# Patient Record
Sex: Female | Born: 1960 | Hispanic: No | Smoking: Never smoker
Health system: Southern US, Community
[De-identification: ages and names within clinical notes are randomized; demographics above are authoritative.]

## PROBLEM LIST (undated history)

## (undated) DIAGNOSIS — G629 Polyneuropathy, unspecified: Secondary | ICD-10-CM

## (undated) DIAGNOSIS — E5111 Dry beriberi: Secondary | ICD-10-CM

## (undated) DIAGNOSIS — I10 Essential (primary) hypertension: Secondary | ICD-10-CM

## (undated) DIAGNOSIS — R160 Hepatomegaly, not elsewhere classified: Secondary | ICD-10-CM

## (undated) DIAGNOSIS — K760 Fatty (change of) liver, not elsewhere classified: Secondary | ICD-10-CM

## (undated) DIAGNOSIS — E559 Vitamin D deficiency, unspecified: Secondary | ICD-10-CM

## (undated) HISTORY — DX: Vitamin D deficiency, unspecified: E55.9

## (undated) HISTORY — DX: Fatty (change of) liver, not elsewhere classified: K76.0

## (undated) HISTORY — DX: Hepatomegaly, not elsewhere classified: R16.0

## (undated) HISTORY — DX: Dry beriberi: E51.11

## (undated) HISTORY — DX: Essential (primary) hypertension: I10

## (undated) HISTORY — DX: Polyneuropathy, unspecified: G62.9

---

## 1999-12-07 ENCOUNTER — Other Ambulatory Visit: Admission: RE | Admit: 1999-12-07 | Discharge: 1999-12-07 | Payer: Self-pay | Admitting: Obstetrics and Gynecology

## 2021-02-08 ENCOUNTER — Other Ambulatory Visit: Payer: Self-pay

## 2021-02-08 ENCOUNTER — Emergency Department (HOSPITAL_COMMUNITY): Payer: BC Managed Care – PPO

## 2021-02-08 ENCOUNTER — Inpatient Hospital Stay (HOSPITAL_COMMUNITY)
Admission: EM | Admit: 2021-02-08 | Discharge: 2021-02-27 | DRG: 640 | Disposition: A | Discharge status: Skilled Nursing Facility | Payer: BC Managed Care – PPO | Attending: Internal Medicine | Admitting: Internal Medicine

## 2021-02-08 ENCOUNTER — Inpatient Hospital Stay (HOSPITAL_COMMUNITY): Payer: BC Managed Care – PPO

## 2021-02-08 ENCOUNTER — Encounter (HOSPITAL_COMMUNITY): Payer: Self-pay

## 2021-02-08 DIAGNOSIS — E876 Hypokalemia: Principal | ICD-10-CM | POA: Diagnosis present

## 2021-02-08 DIAGNOSIS — R531 Weakness: Secondary | ICD-10-CM

## 2021-02-08 DIAGNOSIS — I10 Essential (primary) hypertension: Secondary | ICD-10-CM | POA: Diagnosis not present

## 2021-02-08 DIAGNOSIS — B9620 Unspecified Escherichia coli [E. coli] as the cause of diseases classified elsewhere: Secondary | ICD-10-CM | POA: Diagnosis present

## 2021-02-08 DIAGNOSIS — R260 Ataxic gait: Secondary | ICD-10-CM | POA: Diagnosis present

## 2021-02-08 DIAGNOSIS — Z20822 Contact with and (suspected) exposure to covid-19: Secondary | ICD-10-CM | POA: Diagnosis present

## 2021-02-08 DIAGNOSIS — D696 Thrombocytopenia, unspecified: Secondary | ICD-10-CM | POA: Diagnosis not present

## 2021-02-08 DIAGNOSIS — H409 Unspecified glaucoma: Secondary | ICD-10-CM | POA: Diagnosis present

## 2021-02-08 DIAGNOSIS — D520 Dietary folate deficiency anemia: Secondary | ICD-10-CM | POA: Diagnosis not present

## 2021-02-08 DIAGNOSIS — E871 Hypo-osmolality and hyponatremia: Secondary | ICD-10-CM | POA: Diagnosis not present

## 2021-02-08 DIAGNOSIS — R000 Tachycardia, unspecified: Secondary | ICD-10-CM | POA: Diagnosis present

## 2021-02-08 DIAGNOSIS — E43 Unspecified severe protein-calorie malnutrition: Secondary | ICD-10-CM | POA: Diagnosis present

## 2021-02-08 DIAGNOSIS — K2970 Gastritis, unspecified, without bleeding: Secondary | ICD-10-CM | POA: Diagnosis not present

## 2021-02-08 DIAGNOSIS — N390 Urinary tract infection, site not specified: Secondary | ICD-10-CM | POA: Diagnosis present

## 2021-02-08 DIAGNOSIS — D649 Anemia, unspecified: Secondary | ICD-10-CM | POA: Diagnosis not present

## 2021-02-08 DIAGNOSIS — K648 Other hemorrhoids: Secondary | ICD-10-CM | POA: Diagnosis present

## 2021-02-08 DIAGNOSIS — H04123 Dry eye syndrome of bilateral lacrimal glands: Secondary | ICD-10-CM | POA: Diagnosis present

## 2021-02-08 DIAGNOSIS — D518 Other vitamin B12 deficiency anemias: Secondary | ICD-10-CM | POA: Diagnosis not present

## 2021-02-08 DIAGNOSIS — Z681 Body mass index (BMI) 19 or less, adult: Secondary | ICD-10-CM | POA: Diagnosis not present

## 2021-02-08 DIAGNOSIS — R29898 Other symptoms and signs involving the musculoskeletal system: Secondary | ICD-10-CM

## 2021-02-08 DIAGNOSIS — R296 Repeated falls: Secondary | ICD-10-CM | POA: Diagnosis present

## 2021-02-08 DIAGNOSIS — G8250 Quadriplegia, unspecified: Secondary | ICD-10-CM

## 2021-02-08 LAB — CBC WITH DIFFERENTIAL/PLATELET
Abs Immature Granulocytes: 0.08 10*3/uL — ABNORMAL HIGH (ref 0.00–0.07)
Basophils Absolute: 0.1 10*3/uL (ref 0.0–0.1)
Basophils Relative: 0 %
Eosinophils Absolute: 0 10*3/uL (ref 0.0–0.5)
Eosinophils Relative: 0 %
HCT: 28.5 % — ABNORMAL LOW (ref 36.0–46.0)
Hemoglobin: 10.1 g/dL — ABNORMAL LOW (ref 12.0–15.0)
Immature Granulocytes: 1 %
Lymphocytes Relative: 8 %
Lymphs Abs: 1 10*3/uL (ref 0.7–4.0)
MCH: 36.2 pg — ABNORMAL HIGH (ref 26.0–34.0)
MCHC: 35.4 g/dL (ref 30.0–36.0)
MCV: 102.2 fL — ABNORMAL HIGH (ref 80.0–100.0)
Monocytes Absolute: 1.1 10*3/uL — ABNORMAL HIGH (ref 0.1–1.0)
Monocytes Relative: 9 %
Neutro Abs: 9.7 10*3/uL — ABNORMAL HIGH (ref 1.7–7.7)
Neutrophils Relative %: 82 %
Platelets: 53 10*3/uL — ABNORMAL LOW (ref 150–400)
RBC: 2.79 MIL/uL — ABNORMAL LOW (ref 3.87–5.11)
RDW: 15.5 % (ref 11.5–15.5)
WBC: 11.9 10*3/uL — ABNORMAL HIGH (ref 4.0–10.5)
nRBC: 0.2 % (ref 0.0–0.2)

## 2021-02-08 LAB — BILIRUBIN, FRACTIONATED(TOT/DIR/INDIR)
Bilirubin, Direct: 0.4 mg/dL — ABNORMAL HIGH (ref 0.0–0.2)
Indirect Bilirubin: 0.7 mg/dL (ref 0.3–0.9)
Total Bilirubin: 1.1 mg/dL (ref 0.3–1.2)

## 2021-02-08 LAB — URINALYSIS, ROUTINE W REFLEX MICROSCOPIC
Glucose, UA: 50 mg/dL — AB
Ketones, ur: 20 mg/dL — AB
Nitrite: NEGATIVE
Protein, ur: 30 mg/dL — AB
Specific Gravity, Urine: 1.017 (ref 1.005–1.030)
pH: 5 (ref 5.0–8.0)

## 2021-02-08 LAB — COMPREHENSIVE METABOLIC PANEL
ALT: 34 U/L (ref 0–44)
AST: 62 U/L — ABNORMAL HIGH (ref 15–41)
Albumin: 3.7 g/dL (ref 3.5–5.0)
Alkaline Phosphatase: 79 U/L (ref 38–126)
Anion gap: 14 (ref 5–15)
BUN: 7 mg/dL (ref 6–20)
CO2: 24 mmol/L (ref 22–32)
Calcium: 8.2 mg/dL — ABNORMAL LOW (ref 8.9–10.3)
Chloride: 96 mmol/L — ABNORMAL LOW (ref 98–111)
Creatinine, Ser: 0.67 mg/dL (ref 0.44–1.00)
GFR, Estimated: >60 mL/min (ref >60)
Glucose, Bld: 136 mg/dL — ABNORMAL HIGH (ref 70–99)
Potassium: 2.4 mmol/L — CL (ref 3.5–5.1)
Sodium: 134 mmol/L — ABNORMAL LOW (ref 135–145)
Total Bilirubin: 1.4 mg/dL — ABNORMAL HIGH (ref 0.3–1.2)
Total Protein: 7.2 g/dL (ref 6.5–8.1)

## 2021-02-08 LAB — VITAMIN B12: Vitamin B-12: 374 pg/mL (ref 180–914)

## 2021-02-08 LAB — FOLATE: Folate: 1.8 ng/mL — ABNORMAL LOW (ref >5.9)

## 2021-02-08 LAB — IRON AND TIBC
Iron: 139 ug/dL (ref 28–170)
Saturation Ratios: 89 % — ABNORMAL HIGH (ref 10.4–31.8)
TIBC: 156 ug/dL — ABNORMAL LOW (ref 250–450)
UIBC: 17 ug/dL

## 2021-02-08 LAB — TSH: TSH: 1.081 u[IU]/mL (ref 0.350–4.500)

## 2021-02-08 LAB — RESP PANEL BY RT-PCR (FLU A&B, COVID) ARPGX2
Influenza A by PCR: NEGATIVE
Influenza B by PCR: NEGATIVE
SARS Coronavirus 2 by RT PCR: NEGATIVE

## 2021-02-08 LAB — MAGNESIUM
Magnesium: 1.7 mg/dL (ref 1.7–2.4)
Magnesium: 2.1 mg/dL (ref 1.7–2.4)

## 2021-02-08 LAB — LIPASE, BLOOD: Lipase: 51 U/L (ref 11–51)

## 2021-02-08 IMAGING — MR MR HEAD W/O CM
11 of 15 series · 38 of 48 positions shown · non-contrast
Comparison: Same day CT head.

CLINICAL DATA: Transient ischemic attack.

EXAM:
MRI HEAD WITHOUT CONTRAST
TECHNIQUE: Multiplanar, multiecho pulse sequences of the brain and surrounding
structures were obtained without intravenous contrast.

[Series 5: DWI · axial · 3.0mm · 1.36mm/px · z∈[-47,+93]mm · 5 of 96 slices shown (1 of 2)]
[im 1/96]
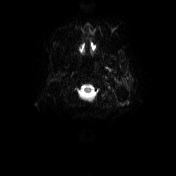
[im 24/96]
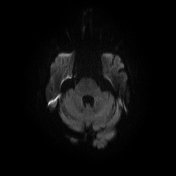
[im 48/96]
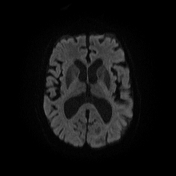
[im 72/96]
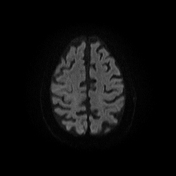
[im 96/96]
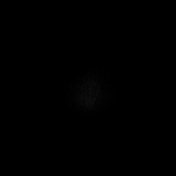

[Series 6: DWI · axial · 3.0mm · 1.36mm/px · z∈[-47,+93]mm · 3 of 48 slices shown (2 of 2)]
[im 1/48]
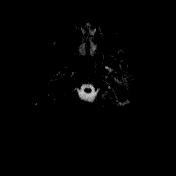
[im 24/48]
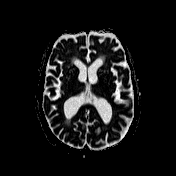
[im 48/48]
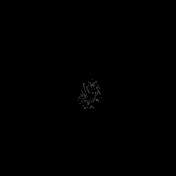

[Series 7: T1 · sagittal · 5.0mm · 0.75mm/px · 2 of 24 slices shown (1 of 2)]
[im 1/24]
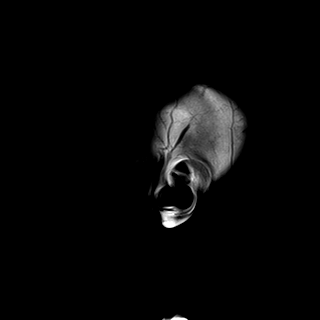
[im 24/24]
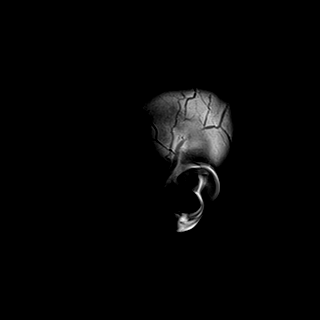

[Series 8: T2 · axial · 5.0mm · 0.62mm/px · z∈[-60,+101]mm · 2 of 26 slices shown (1 of 2)]
[im 1/26]
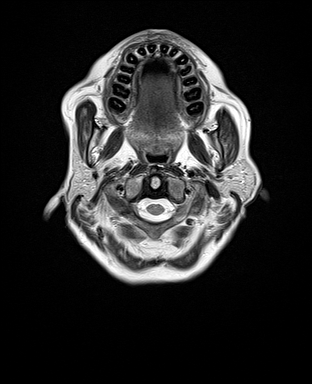
[im 26/26]
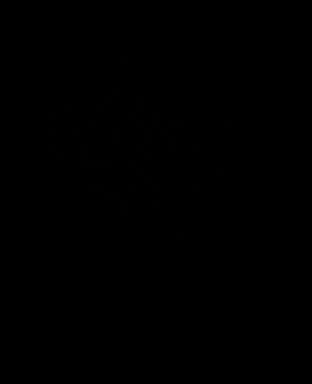

[Series 9: swi_images · axial · 3.0mm · 0.75mm/px · z∈[-61,+102]mm · 4 of 56 slices shown]
[im 1/56]
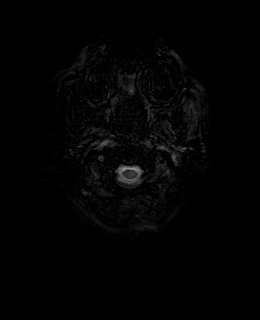
[im 19/56]
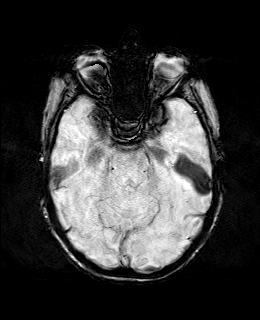
[im 37/56]
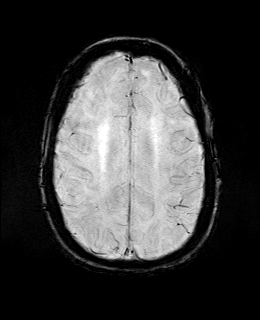
[im 56/56]
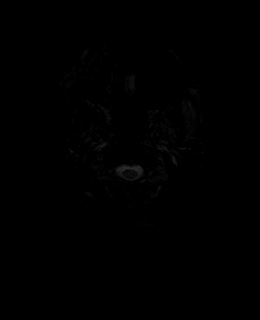

[Series 10: mip_images(sw) · axial · 24.0mm · 0.75mm/px · z∈[-51,+92]mm · 3 of 49 slices shown]
[im 1/49]
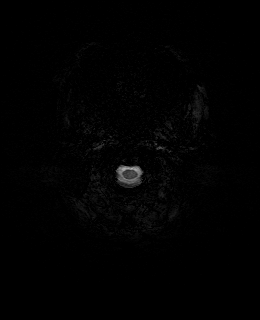
[im 25/49]
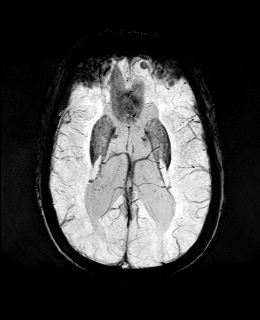
[im 49/49]
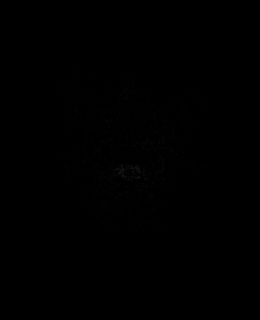

[Series 11: FLAIR · axial · 3.0mm · 0.75mm/px · z∈[-55,+96]mm · 3 of 52 slices shown]
[im 1/52]
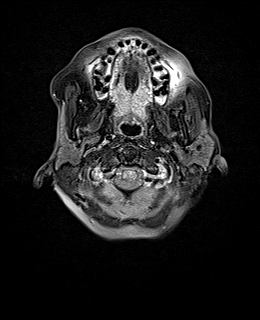
[im 26/52]
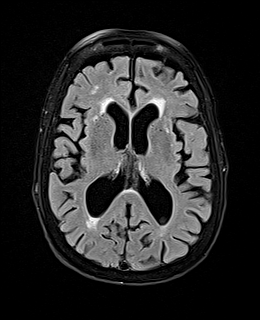
[im 52/52]
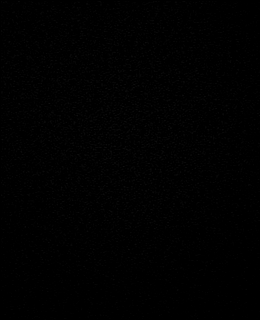

[Series 12: T1 · axial · 1.0mm · 0.94mm/px · z∈[-50,+91]mm · 9 of 144 slices shown (2 of 2)]
[im 1/144]
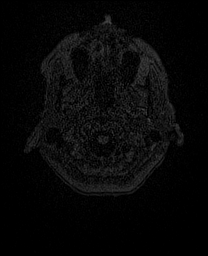
[im 18/144]
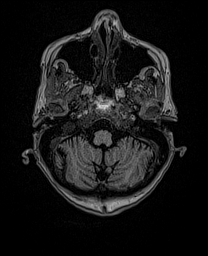
[im 36/144]
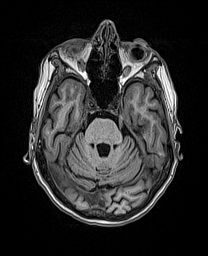
[im 54/144]
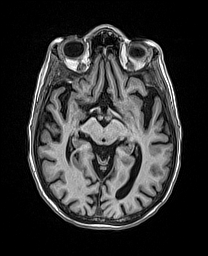
[im 72/144]
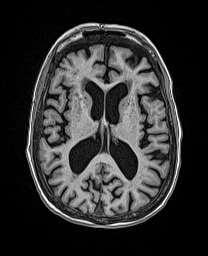
[im 90/144]
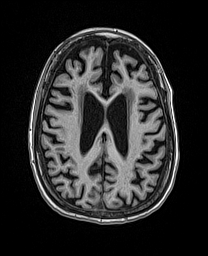
[im 108/144]
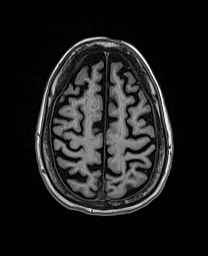
[im 126/144]
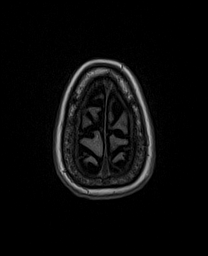
[im 144/144]
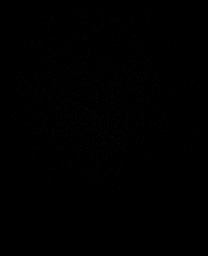

[Series 13: cor dwi_tracew · coronal · 5.0mm · 1.53mm/px · 3 of 54 slices shown]
[im 1/54]
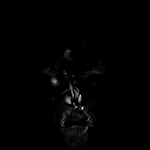
[im 27/54]
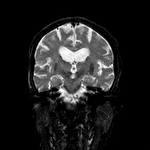
[im 54/54]
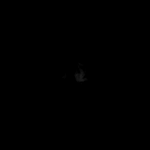

[Series 14: cor dwi_adc · coronal · 5.0mm · 1.53mm/px · 2 of 27 slices shown]
[im 1/27]
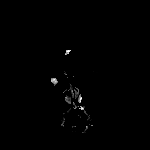
[im 27/27]
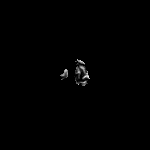

[Series 15: T2 · coronal · 5.0mm · 0.57mm/px · 2 of 34 slices shown (2 of 2)]
[im 1/34]
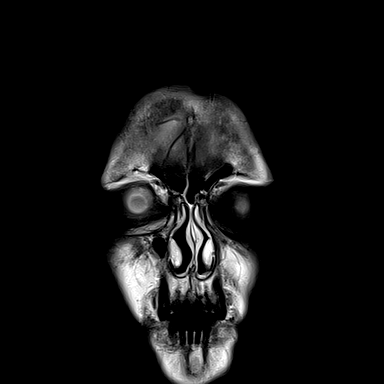
[im 34/34]
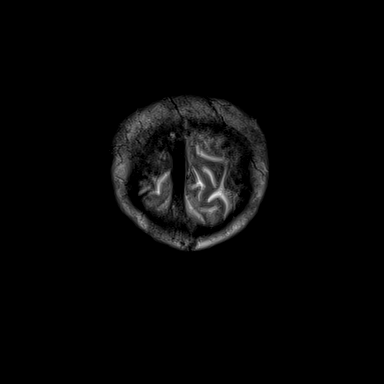

[38 of 48 positions shown; findings below may reference images not displayed]

FINDINGS: Brain: No acute infarction, hemorrhage, hydrocephalus, extra-axial
collection or mass lesion. Moderate atrophy with ventriculomegaly.
Cerebellar tonsils are in normal position. Unremarkable sella.

Vascular: Major arterial flow voids are maintained at the skull
base.

Skull and upper cervical spine: Normal marrow signal.

Sinuses/Orbits: Mild ethmoid air cell mucosal thickening. No
air-fluid levels. Unremarkable orbits.

Other: No sizable mastoid effusions.
IMPRESSION: 1. No acute infarct.
2. Ventriculomegaly, which may be ex vacuo in the setting of
moderate atrophy. Normal pressure hydrocephalus could have a similar
appearance in the correct clinical setting.
3. Mild to moderate T2/FLAIR hyperintensities within the white
matter. This finding is nonspecific but can be seen in the setting
of chronic microvascular ischemia, a demyelinating process such as
multiple sclerosis, chronic migraines, or as the sequelae of a prior
infectious or inflammatory process.

## 2021-02-08 IMAGING — CT CT HEAD W/O CM
3 series · 15 of 47 positions shown, 18 images · non-contrast
Comparison: None.

CLINICAL DATA: Acute neuro deficit.  Vision change.  Unsteady gait

EXAM:
CT HEAD WITHOUT CONTRAST
TECHNIQUE: Contiguous axial images were obtained from the base of the skull
through the vertex without intravenous contrast.

[Series 2: head wo · axial · 0.47mm/px · z∈[-120,+5]mm · 9 of 30 slices shown, 12 images]
[im 3/30  brain]
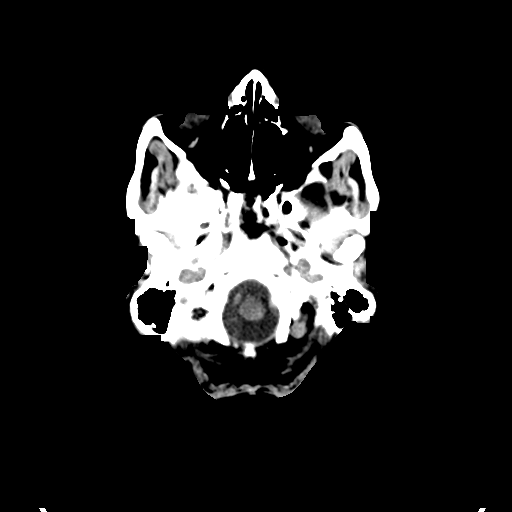
[im 3/30  bone]
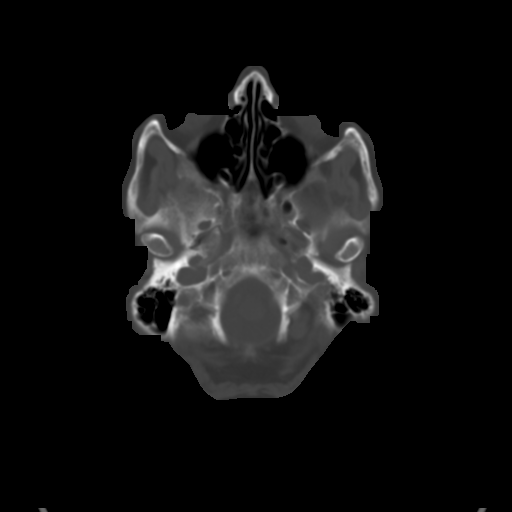
[im 6/30  brain]
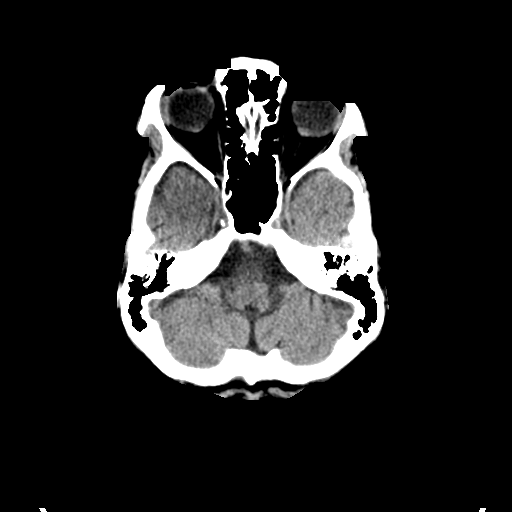
[im 9/30  brain]
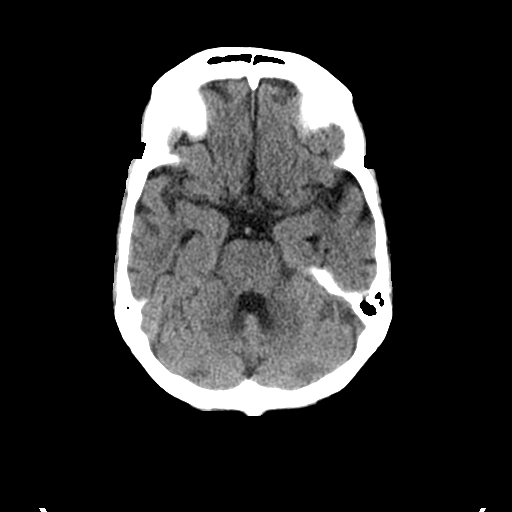
[im 12/30  brain]
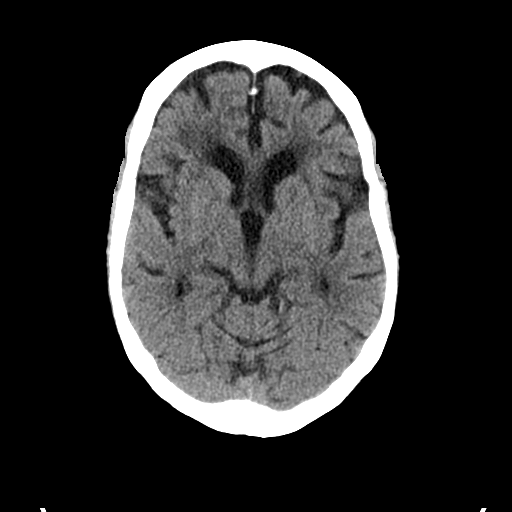
[im 16/30  brain]
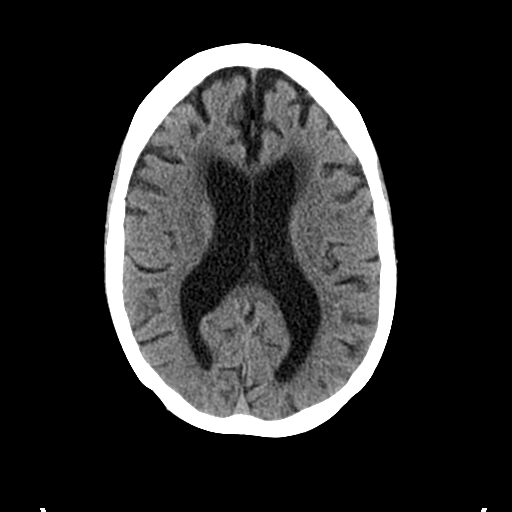
[im 16/30  bone]
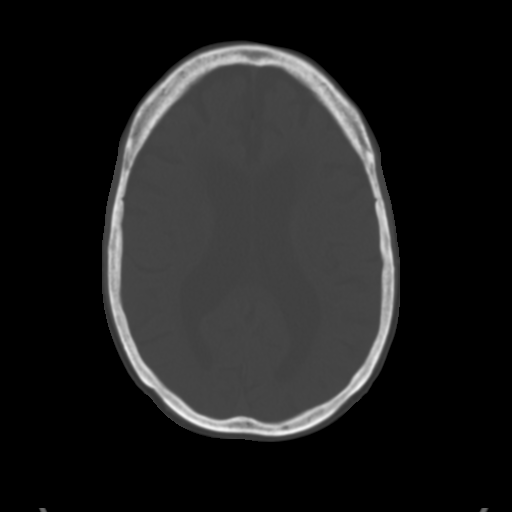
[im 19/30  brain]
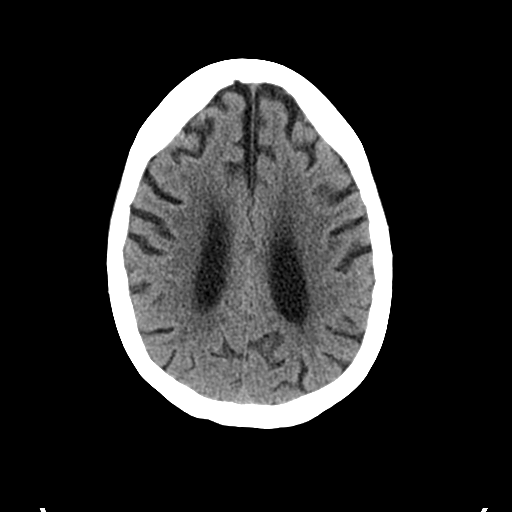
[im 22/30  brain]
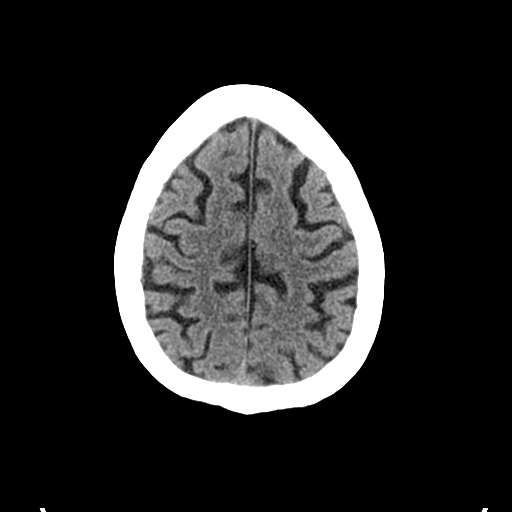
[im 25/30  brain]
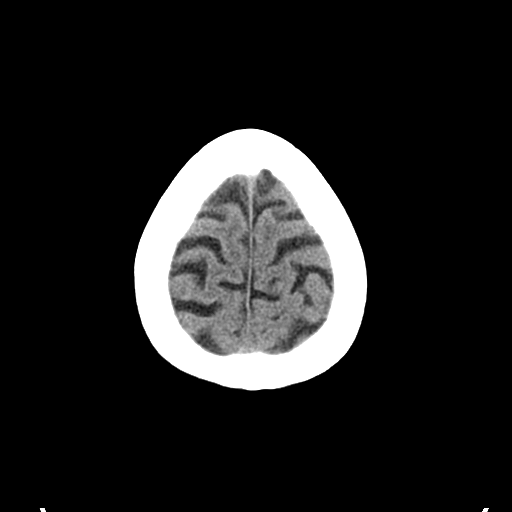
[im 28/30  brain]
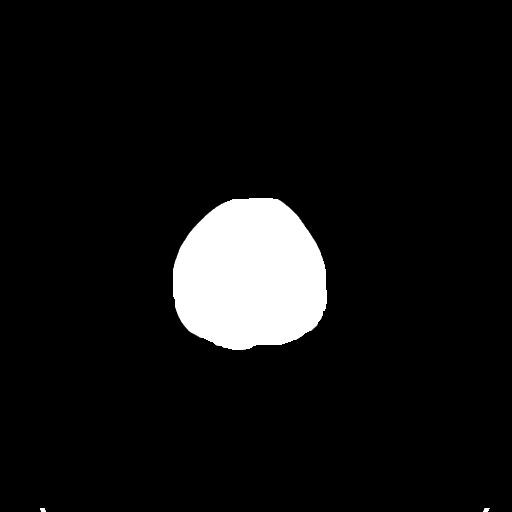
[im 28/30  bone]
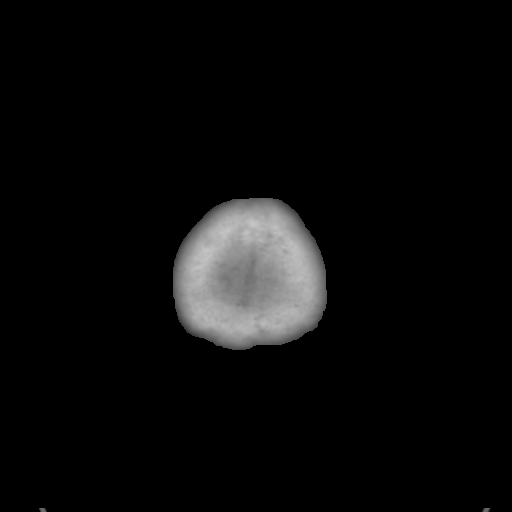

[Series 5: coronal soft tissue · coronal · 0.30mm/px · 3 of 72 slices shown]
[im 24/72  brain]
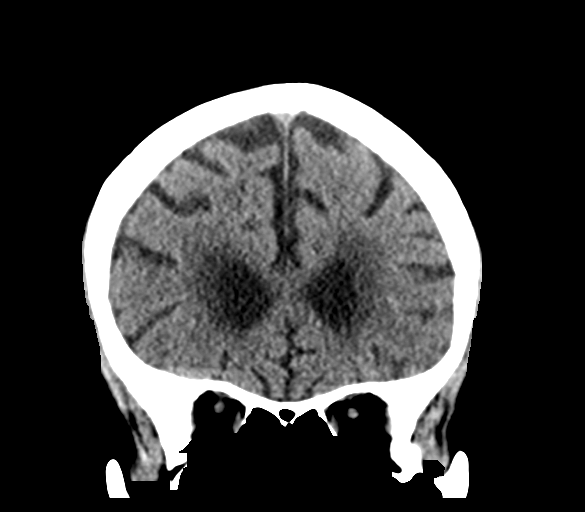
[im 32/72  brain]
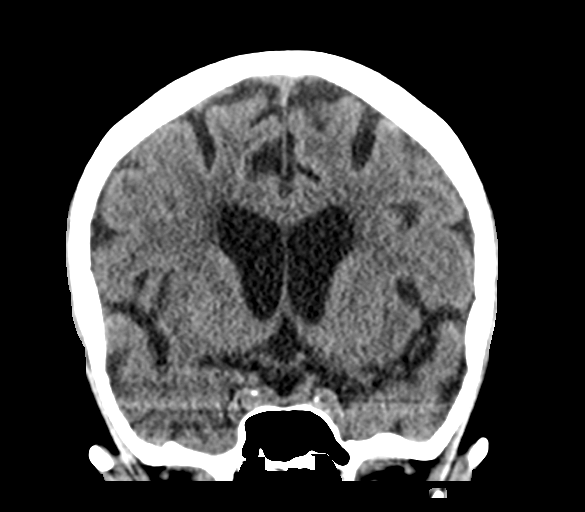
[im 40/72  brain]
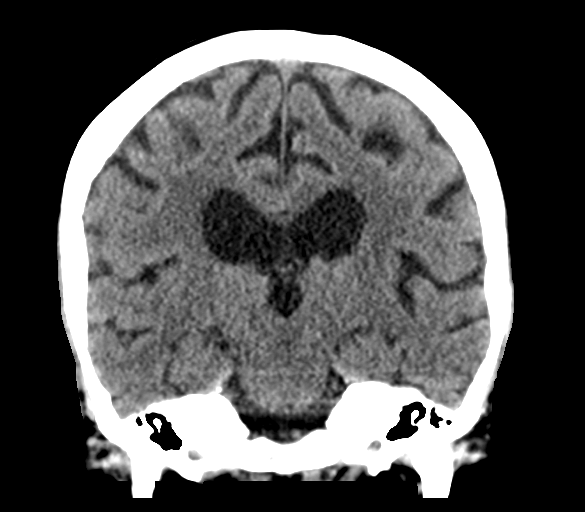

[Series 6: sagittal soft tissue · sagittal · 0.29mm/px · 3 of 55 slices shown]
[im 19/55  brain]
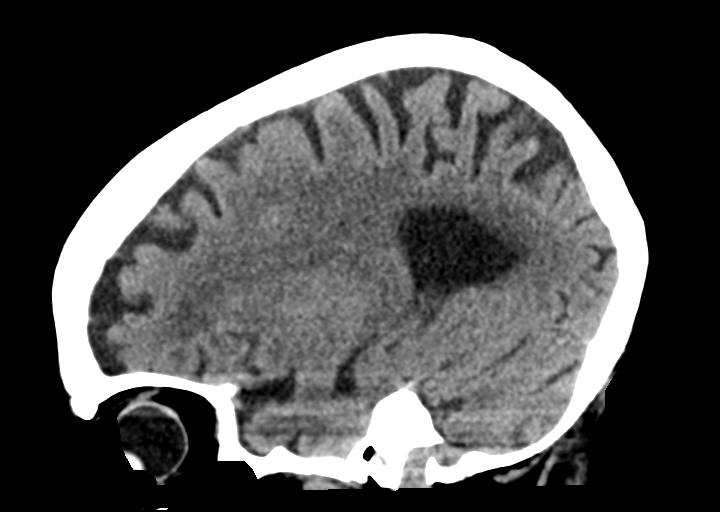
[im 28/55  brain]
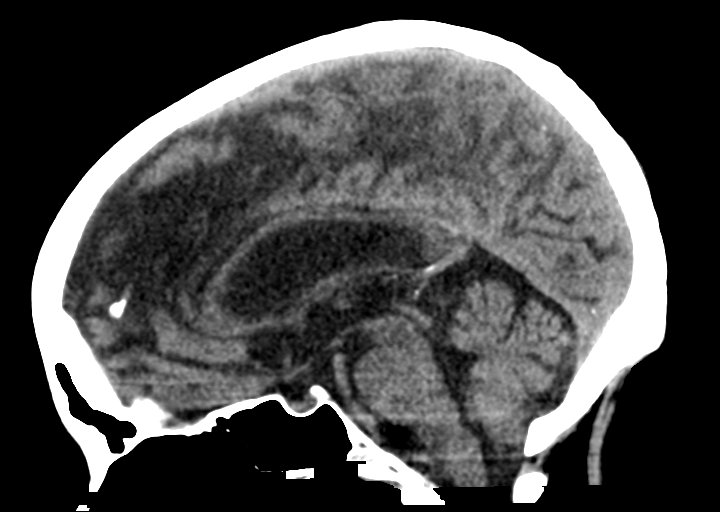
[im 37/55  brain]
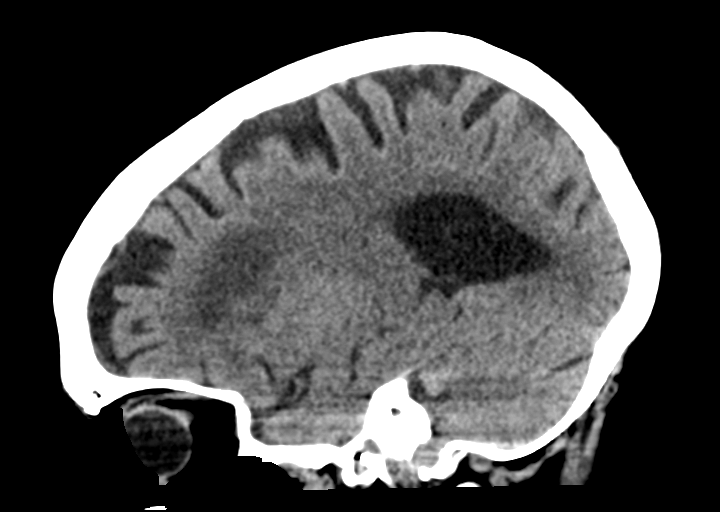

[15 of 47 positions shown; findings below may reference images not displayed]

FINDINGS: Brain: Generalized atrophy. Periventricular white matter hypodensity
bilaterally appears symmetric.

Negative for acute infarct, hemorrhage, mass.

Vascular: Negative for hyperdense vessel

Skull: Negative

Sinuses/Orbits: Negative

Other: None
IMPRESSION: No acute abnormality

Atrophy and bilateral white matter ischemia which appears chronic.

## 2021-02-08 MED ORDER — POTASSIUM CHLORIDE 10 MEQ/100ML IV SOLN
10.0000 meq | Freq: Once | INTRAVENOUS | Status: AC
  Administered 2021-02-08: 10 meq via INTRAVENOUS
  Filled 2021-02-08: qty 100

## 2021-02-08 MED ORDER — SODIUM CHLORIDE 0.9 % IV SOLN
1.0000 g | Freq: Once | INTRAVENOUS | Status: AC
  Administered 2021-02-08: 1 g via INTRAVENOUS
  Filled 2021-02-08: qty 10

## 2021-02-08 MED ORDER — LORAZEPAM 0.5 MG PO TABS
0.5000 mg | ORAL_TABLET | Freq: Four times a day (QID) | ORAL | Status: DC | PRN
  Administered 2021-02-11 – 2021-02-26 (×15): 0.5 mg via ORAL
  Filled 2021-02-08 (×17): qty 1

## 2021-02-08 MED ORDER — POTASSIUM CHLORIDE CRYS ER 20 MEQ PO TBCR
40.0000 meq | EXTENDED_RELEASE_TABLET | Freq: Every day | ORAL | Status: DC
  Administered 2021-02-09: 40 meq via ORAL
  Filled 2021-02-08: qty 2

## 2021-02-08 MED ORDER — CALCIUM GLUCONATE-NACL 1-0.675 GM/50ML-% IV SOLN
1.0000 g | Freq: Once | INTRAVENOUS | Status: AC
  Administered 2021-02-08: 1000 mg via INTRAVENOUS
  Filled 2021-02-08: qty 50

## 2021-02-08 MED ORDER — SODIUM CHLORIDE 0.9 % IV BOLUS
1000.0000 mL | Freq: Once | INTRAVENOUS | Status: AC
  Administered 2021-02-08: 1000 mL via INTRAVENOUS

## 2021-02-08 MED ORDER — MAGNESIUM SULFATE 2 GM/50ML IV SOLN
2.0000 g | Freq: Once | INTRAVENOUS | Status: AC
  Administered 2021-02-08: 2 g via INTRAVENOUS
  Filled 2021-02-08: qty 50

## 2021-02-08 MED ORDER — POTASSIUM CHLORIDE 10 MEQ/100ML IV SOLN
10.0000 meq | INTRAVENOUS | Status: AC
  Administered 2021-02-08 (×5): 10 meq via INTRAVENOUS
  Filled 2021-02-08 (×5): qty 100

## 2021-02-08 MED ORDER — POTASSIUM CHLORIDE CRYS ER 20 MEQ PO TBCR: 40.0000 meq | EXTENDED_RELEASE_TABLET | Freq: Every day | ORAL | Status: DC

## 2021-02-08 MED ORDER — ACETAMINOPHEN 325 MG PO TABS
650.0000 mg | ORAL_TABLET | Freq: Four times a day (QID) | ORAL | Status: DC | PRN
  Administered 2021-02-10 – 2021-02-26 (×8): 650 mg via ORAL
  Filled 2021-02-08 (×8): qty 2

## 2021-02-08 MED ORDER — POTASSIUM CHLORIDE 10 MEQ/100ML IV SOLN: 10.0000 meq | INTRAVENOUS | Status: DC

## 2021-02-08 MED ORDER — ONDANSETRON HCL 4 MG/2ML IJ SOLN
4.0000 mg | Freq: Once | INTRAMUSCULAR | Status: AC
  Administered 2021-02-08: 4 mg via INTRAVENOUS
  Filled 2021-02-08: qty 2

## 2021-02-08 MED ORDER — ACETAMINOPHEN 650 MG RE SUPP: 650.0000 mg | Freq: Four times a day (QID) | RECTAL | Status: DC | PRN

## 2021-02-08 MED ORDER — POTASSIUM CHLORIDE CRYS ER 20 MEQ PO TBCR
40.0000 meq | EXTENDED_RELEASE_TABLET | Freq: Once | ORAL | Status: AC
  Administered 2021-02-08: 40 meq via ORAL
  Filled 2021-02-08: qty 2

## 2021-02-08 NOTE — ED Notes (Signed)
Patient transported to MRI 

## 2021-02-08 NOTE — H&P (Signed)
History and Physical    [name] [name] YPP:[id number] DOB: 17-Nov-1960 DOA: 02/08/2021  PCP: Pcp, No  Patient coming from: Home  Chief Complaint: N/V  HPI: [name] [name] is a 60 y.o. female with no past medical history. Presenting with N/V, vision changes, and weakness. She reports that she's been dealing with N/V intermittently for the last 2 months. She hasn't seen a doctor about her symptoms. She hasn't tried any medicines. Her symptoms were not associated with any events that she can recall. Over the last several days, her symptoms have worsened to include vomiting up everything. She tried gatorade, but that didn't help. She notes that over the last several days she's had increased intermittent visual blurriness. She has no associated HA or eye pain. She has also noted worsening tingling and numbness in her hands and feet. This started 2 weeks ago and has worsened over the last several days. She believes it has resulted in an overall general weakness that has resulted in her having multiple falls this morning. She denies any LOC or head injury during these falls. She became concerned and called for EMS to bring her to the ED. She denies any other aggravating or alleviating factors.   ED Course: She was found to have a K+ of 2.4. She was found to have a UTI. CTH was negative. She was given K+ and rocephin. TRH was called for admission.   Review of Systems:  Denies CP, palpitations, dyspnea, lightheadedness, diarrhea, fever. Review of systems is otherwise negative for all not mentioned in HPI.   PMHx History reviewed. No pertinent past medical history.  PSHx History reviewed. No pertinent surgical history.  SocHx  reports that she has never smoked. She has never used smokeless tobacco. She reports current alcohol use. She reports that she does not use drugs.  No Known Allergies  FamHx History reviewed. No pertinent family history.  Prior to Admission medications   Not on File     Physical Exam: Vitals:   02/08/21 1205 02/08/21 1210 02/08/21 1352 02/08/21 1447  BP: (!) 141/95  (!) 125/98 (!) 121/96  Pulse: (!) 106  (!) 105 (!) 105  Resp: 16  10 14   Temp:  98.5 F (36.9 C) 98 F (36.7 C)   TempSrc:   Oral   SpO2: 100%  98% 98%  Weight:  49.9 kg    Height:  5\' 1"  (1.549 m)      General: 60 y.o. female resting in bed in NAD Eyes: PERRL, normal sclera ENMT: Nares patent w/o discharge, orophaynx clear, dentition normal, ears w/o discharge/lesions/ulcers Neck: Supple, trachea midline Cardiovascular: tachy, +S1, S2, no m/g/r, equal pulses throughout Respiratory: CTABL, no w/r/r, normal WOB GI: BS+, NDNT, no masses noted, no organomegaly noted MSK: No e/c/c Skin: No rashes, bruises, ulcerations noted Neuro: A&O x 3, report numbness and tingling in feet, 4-5/5 msk str BLE Psyc: Appropriate interaction and affect, calm/cooperative  Labs on Admission: I have personally reviewed following labs and imaging studies  CBC: Recent Labs  Lab 02/08/21 1253  WBC 11.9*  NEUTROABS 9.7*  HGB 10.1*  HCT 28.5*  MCV 102.2*  PLT 53*   Basic Metabolic Panel: Recent Labs  Lab 02/08/21 1253  NA 134*  K 2.4*  CL 96*  CO2 24  GLUCOSE 136*  BUN 7  CREATININE 0.67  CALCIUM 8.2*   GFR: Estimated Creatinine Clearance: 57.1 mL/min (by C-G formula based on SCr of 0.67 mg/dL). Liver Function Tests: Recent Labs  Lab 02/08/21  1253  AST 62*  ALT 34  ALKPHOS 79  BILITOT 1.4*  PROT 7.2  ALBUMIN 3.7   Recent Labs  Lab 02/08/21 1253  LIPASE 51   No results for input(s): AMMONIA in the last 168 hours. Coagulation Profile: No results for input(s): INR, PROTIME in the last 168 hours. Cardiac Enzymes: No results for input(s): CKTOTAL, CKMB, CKMBINDEX, TROPONINI in the last 168 hours. BNP (last 3 results) No results for input(s): PROBNP in the last 8760 hours. HbA1C: No results for input(s): HGBA1C in the last 72 hours. CBG: No results for input(s): GLUCAP  in the last 168 hours. Lipid Profile: No results for input(s): CHOL, HDL, LDLCALC, TRIG, CHOLHDL, LDLDIRECT in the last 72 hours. Thyroid Function Tests: No results for input(s): TSH, T4TOTAL, FREET4, T3FREE, THYROIDAB in the last 72 hours. Anemia Panel: No results for input(s): VITAMINB12, FOLATE, FERRITIN, TIBC, IRON, RETICCTPCT in the last 72 hours. Urine analysis:    Component Value Date/Time   COLORURINE AMBER (A) 02/08/2021 1241   APPEARANCEUR CLOUDY (A) 02/08/2021 1241   LABSPEC 1.017 02/08/2021 1241   PHURINE 5.0 02/08/2021 1241   GLUCOSEU 50 (A) 02/08/2021 1241   HGBUR LARGE (A) 02/08/2021 1241   BILIRUBINUR SMALL (A) 02/08/2021 1241   KETONESUR 20 (A) 02/08/2021 1241   PROTEINUR 30 (A) 02/08/2021 1241   NITRITE NEGATIVE 02/08/2021 1241   LEUKOCYTESUR LARGE (A) 02/08/2021 1241    Radiological Exams on Admission: CT HEAD WO CONTRAST  Result Date: 02/08/2021 CLINICAL DATA:  Acute neuro deficit.  Vision change.  Unsteady gait EXAM: CT HEAD WITHOUT CONTRAST TECHNIQUE: Contiguous axial images were obtained from the base of the skull through the vertex without intravenous contrast. COMPARISON:  None. FINDINGS: Brain: Generalized atrophy. Periventricular white matter hypodensity bilaterally appears symmetric. Negative for acute infarct, hemorrhage, mass. 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 Electronically Signed   By: Marlan Palau M.D.   On: 02/08/2021 13:34    EKG: Independently reviewed. Sinus tach, no st elevation  Assessment/Plan BLE Numbness/Tingling Visual changes Generalized weakness     - admit to inpt, progressive     - BLE weakness noted but she says she feels weak all over     - her lytes definitely can be causing her some problems here; in addition to the UTI     - can't dismiss the possibility of TIA/CVA as  she talked about intermittent visual loss over the last several days     - check MRI brain     - replace lytes     - treat UTI     - check B12, folate, thiamine  Hypokalemia Hyponatremia Hypocalcemia     - K+ is 2.4; Getting 60 mEq IV, plus PO BID; follow BMP at 2000hrs tonight     - also getting 2g IV Mg2+; check Mg2+ levels     - Ca2+ corrects from 8.2 to 8.4; can give 1g Ca2+     - continue NS  N/V     - have to watch Qt, especially with her lyte derangement, will add ativan for N/V for now  Thrombocytopenia Macrocytic anemia     - check b12, THF, iron studies     - no evidence of bleeds     - SCDs for DVTs PPX  UTI     - UA dirty; started rocephin in ED  Elevated t bili     -  likely d/t dehydration, but will check a fractionated bili     - continue fluids  Mildly elevated LFTs     - follow  DVT prophylaxis: SCDs  Code Status: DNI  Family Communication: None at bedside  Consults called: None   Status is: Inpatient  Remains inpatient appropriate because:Inpatient level of care appropriate due to severity of illness  Dispo: The patient is from: Home              Anticipated d/c is to: Home              Patient currently is not medically stable to d/c.   Difficult to place patient No  Time spent coordinating admission: 70 minutes  Letecia Arps A Andree Heeg DO [facility]  If 7PM-7AM, please contact night-coverage [email or web address]  02/08/2021, 3:18 PM

## 2021-02-08 NOTE — ED Provider Notes (Signed)
Emergency Medicine Provider Triage Evaluation Note  [name] [name] [name] , [name] [name] 60 y.o. female  was evaluated in triage.  Pt complains of weakness that started about 2 weeks ago. States that hands and feet have been feeling weak/tingly and she also lost vision 3 days ago. She further reports feeling off balance.   Review of Systems  Positive: Weakness, tingling, vision loss, nv Negative: headache  Physical Exam  BP (!) 141/95   Pulse (!) 106   Temp 98.5 F (36.9 C)   Resp 16   Ht 5\' 1"  (1.549 m)   Wt 49.9 kg   LMP  (LMP Unknown) Comment: Last menstrual period was 8-9 years ago  SpO2 100%   BMI 20.78 kg/m  Gen:   Awake, no distress   Resp:  Normal effort  MSK:   Moves extremities without difficulty  Other:  Perrl, bilat horitzonal nystagmus,strength symmetric to bue, ble weaker but symmetric, decreased sensation noted to bue/ble (mostly distally)  Medical Decision Making  Medically screening exam initiated at 12:35 PM.  Appropriate orders placed.  was informed that the remainder of the evaluation will be completed by another provider, this initial triage assessment does not replace that evaluation, and the importance of remaining in the ED until their evaluation is complete.     [name] [name] [name] 02/08/21 1241    02/10/21, MD 02/09/21 [phone]

## 2021-02-08 NOTE — ED Notes (Signed)
ED TO INPATIENT HANDOFF REPORT  Name/Age/Gender [name] [name] 60 y.o. female  Code Status    Code Status Orders  (From admission, onward)         Start     Ordered   02/08/21 2046  Limited resuscitation (code)  Continuous       Question Answer Comment  In the event of cardiac or respiratory ARREST: Initiate Code Blue, Call Rapid Response Yes   In the event of cardiac or respiratory ARREST: Perform CPR Yes   In the event of cardiac or respiratory ARREST: Perform Intubation/Mechanical Ventilation No   In the event of cardiac or respiratory ARREST: Use NIPPV/BiPAp only if indicated Yes   In the event of cardiac or respiratory ARREST: Administer ACLS medications if indicated Yes   In the event of cardiac or respiratory ARREST: Perform Defibrillation or Cardioversion if indicated Yes   Comments DNI confirmed with patient      02/08/21 2045        Code Status History    This patient has a current code status but no historical code status.    Advance Directive Documentation   Flowsheet Row Most Recent Value  Type of Advance Directive Healthcare Power of Attorney, Living will  Pre-existing out of facility DNR order (yellow form or pink MOST form) --  "MOST" Form in Place? --      Home/SNF/Other Home  Chief Complaint Hypokalemia [E87.6]  Level of Care/Admitting Diagnosis ED Disposition    ED Disposition  Admit   Condition  --   Comment  Hospital Area: [facility] [[id number]]  Level of Care: Progressive [102]  Admit to Progressive based on following criteria: MULTISYSTEM THREATS such as stable sepsis, metabolic/electrolyte imbalance with or without encephalopathy that is responding to early treatment.  May admit patient to [facility] or [facility] if equivalent level of care is available:: No  Covid Evaluation: Confirmed COVID Negative  Diagnosis: Hypokalemia [172180]  Admitting Physician: Teddy Spike [[id number]]  Attending Physician: Teddy Spike [[id number]]  Estimated length of stay: past midnight tomorrow  Certification:: I certify this patient will need inpatient services for at least 2 midnights         Medical History History reviewed. No pertinent past medical history.  Allergies No Known Allergies  IV Location/Drains/Wounds Patient Lines/Drains/Airways Status    Active Line/Drains/Airways    Name Placement date Placement time Site Days   Peripheral IV 02/08/21 20 G Right Forearm 02/08/21  1252  Forearm  less than 1          Labs/Imaging Results for orders placed or performed during the hospital encounter of 02/08/21 (from the past 48 hour(s))  Urinalysis, Routine w reflex microscopic Urine, Clean Catch     Status: Abnormal   Collection Time: 02/08/21 12:41 PM  Result Value Ref Range   Color, Urine AMBER (A) YELLOW    Comment: BIOCHEMICALS MAY BE AFFECTED BY COLOR   APPearance CLOUDY (A) CLEAR   Specific Gravity, Urine 1.017 1.005 - 1.030   pH 5.0 5.0 - 8.0   Glucose, UA 50 (A) NEGATIVE mg/dL   Hgb urine dipstick LARGE (A) NEGATIVE   Bilirubin Urine SMALL (A) NEGATIVE   Ketones, ur 20 (A) NEGATIVE mg/dL   Protein, ur 30 (A) NEGATIVE mg/dL   Nitrite NEGATIVE NEGATIVE   Leukocytes,Ua LARGE (A) NEGATIVE   RBC / HPF 0-5 0 - 5 RBC/hpf   WBC, UA 11-20 0 - 5 WBC/hpf   Bacteria,  UA RARE (A) NONE SEEN   Squamous Epithelial / LPF 6-10 0 - 5   Mucus PRESENT    Hyaline Casts, UA PRESENT     Comment: Performed at [facility], [address], [place], [place] [address]  Comprehensive metabolic panel     Status: Abnormal   Collection Time: 02/08/21 12:53 PM  Result Value Ref Range   Sodium 134 (L) 135 - 145 mmol/L   Potassium 2.4 (LL) 3.5 - 5.1 mmol/L    Comment: CRITICAL RESULT CALLED TO, READ BACK BY AND VERIFIED WITH: M.BOWEN, RN AT 1336 ON 06.13.22 BY N.THOMPSON    Chloride 96 (L) 98 - 111 mmol/L   CO2 24 22 - 32 mmol/L   Glucose, Bld 136 (H) 70 - 99 mg/dL    Comment: Glucose  reference range applies only to samples taken after fasting for at least 8 hours.   BUN 7 6 - 20 mg/dL   Creatinine, Ser 8.50 0.44 - 1.00 mg/dL   Calcium 8.2 (L) 8.9 - 10.3 mg/dL   Total Protein 7.2 6.5 - 8.1 g/dL   Albumin 3.7 3.5 - 5.0 g/dL   AST 62 (H) 15 - 41 U/L   ALT 34 0 - 44 U/L   Alkaline Phosphatase 79 38 - 126 U/L   Total Bilirubin 1.4 (H) 0.3 - 1.2 mg/dL   GFR, Estimated >27 >74 mL/min    Comment: (NOTE) Calculated using the CKD-EPI Creatinine Equation (2021)    Anion gap 14 5 - 15    Comment: Performed at [facility], [address], [place], [place] [address]  CBC with Differential     Status: Abnormal   Collection Time: 02/08/21 12:53 PM  Result Value Ref Range   WBC 11.9 (H) 4.0 - 10.5 K/uL   RBC 2.79 (L) 3.87 - 5.11 MIL/uL   Hemoglobin 10.1 (L) 12.0 - 15.0 g/dL   HCT 67.6 (L) 72.0 - 94.7 %   MCV 102.2 (H) 80.0 - 100.0 fL   MCH 36.2 (H) 26.0 - 34.0 pg   MCHC 35.4 30.0 - 36.0 g/dL   RDW 09.6 28.3 - 66.2 %   Platelets 53 (L) 150 - 400 K/uL    Comment: Immature Platelet Fraction may be clinically indicated, consider ordering this additional test [id number]    nRBC 0.2 0.0 - 0.2 %   Neutrophils Relative % 82 %   Neutro Abs 9.7 (H) 1.7 - 7.7 K/uL   Lymphocytes Relative 8 %   Lymphs Abs 1.0 0.7 - 4.0 K/uL   Monocytes Relative 9 %   Monocytes Absolute 1.1 (H) 0.1 - 1.0 K/uL   Eosinophils Relative 0 %   Eosinophils Absolute 0.0 0.0 - 0.5 K/uL   Basophils Relative 0 %   Basophils Absolute 0.1 0.0 - 0.1 K/uL   Immature Granulocytes 1 %   Abs Immature Granulocytes 0.08 (H) 0.00 - 0.07 K/uL    Comment: Performed at [facility], [address] [place], [place] [address]  Lipase, blood     Status: None   Collection Time: 02/08/21 12:53 PM  Result Value Ref Range   Lipase 51 11 - 51 U/L    Comment: Performed at [facility], [address], [place], [place] [address]  Resp Panel by RT-PCR (Flu A&B, Covid)  Nasopharyngeal Swab     Status: None   Collection Time: 02/08/21 12:53 PM   Specimen: Nasopharyngeal Swab; Nasopharyngeal(NP) swabs in vial transport medium  Result  Value Ref Range   SARS Coronavirus 2 by RT PCR NEGATIVE NEGATIVE    Comment: (NOTE) SARS-CoV-2 target nucleic acids are NOT DETECTED.  The SARS-CoV-2 RNA is generally detectable in upper respiratory specimens during the acute phase of infection. The lowest concentration of SARS-CoV-2 viral copies this assay can detect is 138 copies/mL. A negative result does not preclude SARS-Cov-2 infection and should not be used as the sole basis for treatment or other patient management decisions. A negative result may occur with  improper specimen collection/handling, submission of specimen other than nasopharyngeal swab, presence of viral mutation(s) within the areas targeted by this assay, and inadequate number of viral copies(<138 copies/mL). A negative result must be combined with clinical observations, patient history, and epidemiological information. The expected result is Negative.  Fact Sheet for Patients:  BloggerCourse.com  Fact Sheet for Healthcare Providers:  SeriousBroker.it  This test is no t yet approved or cleared by the [place] FDA and  has been authorized for detection and/or diagnosis of SARS-CoV-2 by FDA under an Emergency Use Authorization (EUA). This EUA will remain  in effect (meaning this test can be used) for the duration of the COVID-19 declaration under Section 564(b)(1) of the Act, 21 U.S.C.section 360bbb-3(b)(1), unless the authorization is terminated  or revoked sooner.       Influenza A by PCR NEGATIVE NEGATIVE   Influenza B by PCR NEGATIVE NEGATIVE    Comment: (NOTE) The Xpert Xpress SARS-CoV-2/FLU/RSV plus assay is intended as an aid in the diagnosis of influenza from Nasopharyngeal swab specimens and should not be used as a sole basis for  treatment. Nasal washings and aspirates are unacceptable for Xpert Xpress SARS-CoV-2/FLU/RSV testing.  Fact Sheet for Patients: BloggerCourse.com  Fact Sheet for Healthcare Providers: SeriousBroker.it  This test is not yet approved or cleared by the [place] FDA and has been authorized for detection and/or diagnosis of SARS-CoV-2 by FDA under an Emergency Use Authorization (EUA). This EUA will remain in effect (meaning this test can be used) for the duration of the COVID-19 declaration under Section 564(b)(1) of the Act, 21 U.S.C. section 360bbb-3(b)(1), unless the authorization is terminated or revoked.  Performed at [facility], [address] [place], [place] [address]   Magnesium     Status: None   Collection Time: 02/08/21 12:53 PM  Result Value Ref Range   Magnesium 1.7 1.7 - 2.4 mg/dL    Comment: Performed at [facility], [address] [place], [place] [address]  TSH     Status: None   Collection Time: 02/08/21  2:38 PM  Result Value Ref Range   TSH 1.081 0.350 - 4.500 uIU/mL    Comment: Performed by a 3rd Generation assay with a functional sensitivity of <=0.01 uIU/mL. Performed at [facility], [address] [place], [place] [address]   Vitamin B12     Status: None   Collection Time: 02/08/21  4:02 PM  Result Value Ref Range   Vitamin B-12 374 180 - 914 pg/mL    Comment: (NOTE) This assay is not validated for testing neonatal or myeloproliferative syndrome specimens for Vitamin B12 levels. Performed at [facility], [address] [place], [place] [address]   Iron and TIBC     Status: Abnormal   Collection Time: 02/08/21  4:02 PM  Result Value Ref Range   Iron 139 28 - 170 ug/dL   TIBC 295 (L) 621 - 308 ug/dL   Saturation Ratios  89 (H) 10.4 - 31.8 %   UIBC 17 ug/dL    Comment: Performed at [facility],  [address], [place], [place] [address]  Folate, serum, performed at [facility] lab     Status: Abnormal   Collection Time: 02/08/21  4:03 PM  Result Value Ref Range   Folate 1.8 (L) >5.9 ng/mL    Comment: Performed at [facility], [address] [place], [place] [address]   CT HEAD WO CONTRAST  Result Date: 02/08/2021 CLINICAL DATA:  Acute neuro deficit.  Vision change.  Unsteady gait EXAM: CT HEAD WITHOUT CONTRAST TECHNIQUE: Contiguous axial images were obtained from the base of the skull through the vertex without intravenous contrast. COMPARISON:  None. FINDINGS: Brain: Generalized atrophy. Periventricular white matter hypodensity bilaterally appears symmetric. Negative for acute infarct, hemorrhage, mass. 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 Electronically Signed   By: Marlan Palau M.D.   On: 02/08/2021 13:34   MR BRAIN WO CONTRAST  Result Date: 02/08/2021 CLINICAL DATA:  Transient ischemic attack. EXAM: MRI HEAD WITHOUT CONTRAST TECHNIQUE: Multiplanar, multiecho pulse sequences of the brain and surrounding structures were obtained without intravenous contrast. COMPARISON:  Same day CT head. FINDINGS: Brain: No acute infarction, hemorrhage, hydrocephalus, extra-axial collection or mass lesion. Moderate atrophy with ventriculomegaly. Cerebellar tonsils are in normal position. Unremarkable sella. Vascular: Major arterial flow voids are maintained at the skull base. Skull and upper cervical spine: Normal marrow signal. Sinuses/Orbits: Mild ethmoid air cell mucosal thickening. No air-fluid levels. Unremarkable orbits. Other: No sizable mastoid effusions. IMPRESSION: 1. No acute infarct. 2. Ventriculomegaly, which may be ex vacuo in the setting of moderate atrophy. Normal pressure hydrocephalus could have a similar  appearance in the correct clinical setting. 3. Mild to moderate T2/FLAIR hyperintensities within the white matter. This finding is nonspecific but can be seen in the setting of chronic microvascular ischemia, a demyelinating process such as multiple sclerosis, chronic migraines, or as the sequelae of a prior infectious or inflammatory process. Electronically Signed   By: Feliberto Harts MD   On: 02/08/2021 17:34    Pending Labs Unresulted Labs (From admission, onward)    Start     Ordered   02/09/21 2000  Basic metabolic panel  Tomorrow morning,   R        02/08/21 2045   02/09/21 0500  Comprehensive metabolic panel  Tomorrow morning,   R        02/08/21 2045   02/09/21 0500  CBC  Tomorrow morning,   R        02/08/21 2045   02/08/21 2046  HIV Antibody (routine testing w rflx)  (HIV Antibody (Routine testing w reflex) panel)  Once,   STAT        02/08/21 2045   02/08/21 2046  Magnesium  Once,   STAT        02/08/21 2045   02/08/21 2046  Bilirubin, fractionated(tot/dir/indir)  Once,   STAT        02/08/21 2045   02/08/21 1603  Vitamin B1  Once,   STAT        02/08/21 1602   02/08/21 1459  Urine culture  ONCE - STAT,   STAT        02/08/21 1458          Vitals/Pain Today's Vitals   02/08/21 1830 02/08/21 1930 02/08/21  2000 02/08/21 2100  BP: (!) 140/107 (!) 133/96 (!) 138/97 (!) 140/96  Pulse: (!) 130 92 100 98  Resp: 15 15 14 12   Temp:      TempSrc:      SpO2: 98% 100% 100% 100%  Weight:      Height:      PainSc:        Isolation Precautions No active isolations  Medications Medications  potassium chloride SA (KLOR-CON) CR tablet 40 mEq (has no administration in time range)  acetaminophen (TYLENOL) tablet 650 mg (has no administration in time range)    Or  acetaminophen (TYLENOL) suppository 650 mg (has no administration in time range)  LORazepam (ATIVAN) tablet 0.5 mg (has no administration in time range)  potassium chloride 10 mEq in 100 mL IVPB (10 mEq  Intravenous New Bag/Given 02/08/21 2148)  calcium gluconate 1 g/ 50 mL sodium chloride IVPB (1,000 mg Intravenous New Bag/Given 02/08/21 2110)  sodium chloride 0.9 % bolus 1,000 mL (0 mLs Intravenous Stopped 02/08/21 1609)  potassium chloride SA (KLOR-CON) CR tablet 40 mEq (40 mEq Oral Given 02/08/21 1452)  potassium chloride 10 mEq in 100 mL IVPB (0 mEq Intravenous Stopped 02/08/21 1557)  ondansetron (ZOFRAN) injection 4 mg (4 mg Intravenous Given 02/08/21 1452)  cefTRIAXone (ROCEPHIN) 1 g in sodium chloride 0.9 % 100 mL IVPB (0 g Intravenous Stopped 02/08/21 1755)  magnesium sulfate IVPB 2 g 50 mL (0 g Intravenous Stopped 02/08/21 1701)    Mobility Ambulatory with assistance

## 2021-02-08 NOTE — ED Provider Notes (Signed)
[facility]-EMERGENCY DEPT Provider Note   CSN: [id number] Arrival date & time: 02/08/21  1156     History Chief Complaint  Patient presents with   Weakness    [name] [name] is a 60 y.o. female.  The history is provided by the patient and medical records. No language interpreter was used.  Weakness  60 year old female brought here via EMS from home for evaluation of generalized weakness.  Patient report for the past several months she has been having progressive generalized weakness.  She also noticed that within the past week she experienced lightheadedness, fall several times without any loss of consciousness.  She also mentioned having persistent nausea, occasional nausea and vomiting.  She has decrease in appetite.  She does endorse having increasing stress.  She denies fever, cold symptoms, night sweats, loss of taste or smell.  She does not complain of any significant urinary discomfort.  She endorsed nausea with vomiting but having normal bowel movement.  She denies any active cancer.  She lives at home by herself.  She has been vaccinated with COVID but no boosters.  She denies alcohol or tobacco abuse.  Denies SI or HI.  Denies any history of thyroid disease.  She denies double vision or complete loss of vision but endorses decrease in vision due to blurriness.  She also complaining of tingling sensation to hands and feet with weakness throughout.  No complaint of headache or neck pain.   History reviewed. No pertinent past medical history.  There are no problems to display for this patient.   History reviewed. No pertinent surgical history.   OB History   No obstetric history on file.     History reviewed. No pertinent family history.  Social History   Tobacco Use   Smoking status: Never   Smokeless tobacco: Never  Substance Use Topics   Alcohol use: Yes    Comment: spcoa;   Drug use: Never    Home Medications Prior to Admission  medications   Not on File    Allergies    Patient has no known allergies.  Review of Systems   Review of Systems  Neurological:  Positive for weakness.  All other systems reviewed and are negative.  Physical Exam Updated Vital Signs BP (!) 141/95   Pulse (!) 106   Temp 98.5 F (36.9 C)   Resp 16   Ht 5\' 1"  (1.549 m)   Wt 49.9 kg   LMP  (LMP Unknown) Comment: Last menstrual period was 8-9 years ago  SpO2 100%   BMI 20.78 kg/m   Physical Exam Vitals and nursing note reviewed.  Constitutional:      General: She is not in acute distress.    Appearance: She is well-developed.     Comments: Pale and thin appearing female, resting in no acute discomfort  HENT:     Head: Normocephalic and atraumatic.  Eyes:     Extraocular Movements: Extraocular movements intact.     Conjunctiva/sclera: Conjunctivae normal.     Pupils: Pupils are equal, round, and reactive to light.  Cardiovascular:     Rate and Rhythm: Tachycardia present.     Pulses: Normal pulses.     Heart sounds: Normal heart sounds.  Pulmonary:     Effort: Pulmonary effort is normal.     Breath sounds: No wheezing, rhonchi or rales.  Abdominal:     Palpations: Abdomen is soft.     Tenderness: There is no abdominal tenderness.  Musculoskeletal:  Cervical back: Neck supple.     Comments: Global weakness without focal weakness.  Skin:    Coloration: Skin is pale.     Findings: No rash.  Neurological:     Mental Status: She is alert and oriented to person, place, and time.     GCS: GCS eye subscore is 4. GCS verbal subscore is 5. GCS motor subscore is 6.     Cranial Nerves: Cranial nerves are intact.     Motor: Weakness present.     Coordination: Coordination is intact.  Psychiatric:        Mood and Affect: Mood normal.    ED Results / Procedures / Treatments   Labs (all labs ordered are listed, but only abnormal results are displayed) Labs Reviewed  COMPREHENSIVE METABOLIC PANEL - Abnormal; Notable  for the following components:      Result Value   Sodium 134 (*)    Potassium 2.4 (*)    Chloride 96 (*)    Glucose, Bld 136 (*)    Calcium 8.2 (*)    AST 62 (*)    Total Bilirubin 1.4 (*)    All other components within normal limits  CBC WITH DIFFERENTIAL/PLATELET - Abnormal; Notable for the following components:   WBC 11.9 (*)    RBC 2.79 (*)    Hemoglobin 10.1 (*)    HCT 28.5 (*)    MCV 102.2 (*)    MCH 36.2 (*)    Platelets 53 (*)    Neutro Abs 9.7 (*)    Monocytes Absolute 1.1 (*)    Abs Immature Granulocytes 0.08 (*)    All other components within normal limits  RESP PANEL BY RT-PCR (FLU A&B, COVID) [id number]  LIPASE, BLOOD  URINALYSIS, ROUTINE W REFLEX MICROSCOPIC    EKG None ED ECG REPORT   Date: 02/08/2021  Rate: 130  Rhythm: sinus tachycardia  QRS Axis: normal  Intervals: QT prolonged  ST/T Wave abnormalities: normal  Conduction Disutrbances:none  Narrative Interpretation:   Old EKG Reviewed: changes noted  I have personally reviewed the EKG tracing and agree with the computerized printout as noted.   Radiology CT HEAD WO CONTRAST  Result Date: 02/08/2021 CLINICAL DATA:  Acute neuro deficit.  Vision change.  Unsteady gait EXAM: CT HEAD WITHOUT CONTRAST TECHNIQUE: Contiguous axial images were obtained from the base of the skull through the vertex without intravenous contrast. COMPARISON:  None. FINDINGS: Brain: Generalized atrophy. Periventricular white matter hypodensity bilaterally appears symmetric. Negative for acute infarct, hemorrhage, mass. 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 Electronically Signed   By: Marlan Palau M.D.   On: 02/08/2021 13:34    Procedures .Critical Care  Date/Time: 02/08/2021 3:26 PM Performed by: Fayrene Helper, PA-C Authorized by: Fayrene Helper, PA-C   Critical care provider  statement:    Critical care time (minutes):  45   Critical care was time spent personally by me on the following activities:  Discussions with consultants, evaluation of patient's response to treatment, examination of patient, ordering and performing treatments and interventions, ordering and review of laboratory studies, ordering and review of radiographic studies, pulse oximetry, re-evaluation of patient's condition, obtaining history from patient or surrogate and review of old charts   Medications Ordered in ED Medications  potassium chloride 10 mEq in 100 mL IVPB (10 mEq Intravenous New Bag/Given 02/08/21 1452)  cefTRIAXone (ROCEPHIN) 1 g in sodium chloride 0.9 %  100 mL IVPB (has no administration in time range)  potassium chloride 10 mEq in 100 mL IVPB (10 mEq Intravenous Not Given 02/08/21 1522)  potassium chloride SA (KLOR-CON) CR tablet 40 mEq (40 mEq Oral Not Given 02/08/21 1519)  sodium chloride 0.9 % bolus 1,000 mL (1,000 mLs Intravenous New Bag/Given 02/08/21 1451)  potassium chloride SA (KLOR-CON) CR tablet 40 mEq (40 mEq Oral Given 02/08/21 1452)  ondansetron (ZOFRAN) injection 4 mg (4 mg Intravenous Given 02/08/21 1452)    ED Course  I have reviewed the triage vital signs and the nursing notes.  Pertinent labs & imaging results that were available during my care of the patient were reviewed by me and considered in my medical decision making (see chart for details).    MDM Rules/Calculators/A&P                          BP (!) 121/96   Pulse (!) 105   Temp 98 F (36.7 C) (Oral)   Resp 14   Ht 5\' 1"  (1.549 m)   Wt 49.9 kg   LMP  (LMP Unknown) Comment: Last menstrual period was 8-9 years ago  SpO2 98%   BMI 20.78 kg/m   Final Clinical Impression(s) / ED Diagnoses Final diagnoses:  Hypokalemia  Lower urinary tract infectious disease  Weakness    Rx / DC Orders ED Discharge Orders     None      3:23 PM Patient endorsed decreased appetite, recurrent nausea, having  some abdominal discomfort.  No significant abdominal discomfort on exam.  Patient however appears to be globally weak, she is tachycardic, she endorsed having bouts of vomiting as well as falling recently.  Patient's labs remarkable for hypokalemia with potassium of 2.4 furthermore she is tachycardic with prolonged QT and findings concerning for potassium related EKG changes.  Urinalysis shows cloudy appearance, large leukocyte Estrace with large hemoglobin and urine dipsticks.  Will treat for urinary tract infection with Rocephin.  Urine culture sent.  Appreciate consultation from [facility] hospitalist, Dr. who agrees to admit patient.  He request patient to receive appropriate potassium supplementation to help correct her electrolytes derangement.     Ronaldo Miyamoto, PA-C 02/08/21 1527    02/10/21, MD 02/11/21 1331

## 2021-02-08 NOTE — ED Triage Notes (Signed)
Pt BIB [facility] from home. Per pt she has had increased weakness since Easter, however starting Friday, she has experienced vision changes, unsteady gait, trouble with balancing, swelling sensation in her feet and hands as well as increased falls. Pt endorses she has fallen twice today. Pt denies hitting head when she fell and is not on blood thinners. Pt also c/o abdominal pain and N/V. Pt was given 4mg  of Zofran by EMS at 1150 and received of NS. Pt is A&Ox4.

## 2021-02-09 ENCOUNTER — Inpatient Hospital Stay (HOSPITAL_COMMUNITY): Payer: BC Managed Care – PPO

## 2021-02-09 LAB — COMPREHENSIVE METABOLIC PANEL
ALT: 28 U/L (ref 0–44)
AST: 50 U/L — ABNORMAL HIGH (ref 15–41)
Albumin: 3 g/dL — ABNORMAL LOW (ref 3.5–5.0)
Alkaline Phosphatase: 69 U/L (ref 38–126)
Anion gap: 12 (ref 5–15)
BUN: <5 mg/dL — ABNORMAL LOW (ref 6–20)
CO2: 22 mmol/L (ref 22–32)
Calcium: 8 mg/dL — ABNORMAL LOW (ref 8.9–10.3)
Chloride: 101 mmol/L (ref 98–111)
Creatinine, Ser: 0.42 mg/dL — ABNORMAL LOW (ref 0.44–1.00)
GFR, Estimated: >60 mL/min (ref >60)
Glucose, Bld: 101 mg/dL — ABNORMAL HIGH (ref 70–99)
Potassium: 3.5 mmol/L (ref 3.5–5.1)
Sodium: 135 mmol/L (ref 135–145)
Total Bilirubin: 0.8 mg/dL (ref 0.3–1.2)
Total Protein: 6.2 g/dL — ABNORMAL LOW (ref 6.5–8.1)

## 2021-02-09 LAB — BASIC METABOLIC PANEL
Anion gap: 14 (ref 5–15)
BUN: <5 mg/dL — ABNORMAL LOW (ref 6–20)
CO2: 20 mmol/L — ABNORMAL LOW (ref 22–32)
Calcium: 7.9 mg/dL — ABNORMAL LOW (ref 8.9–10.3)
Chloride: 99 mmol/L (ref 98–111)
Creatinine, Ser: 0.56 mg/dL (ref 0.44–1.00)
GFR, Estimated: >60 mL/min (ref >60)
Glucose, Bld: 120 mg/dL — ABNORMAL HIGH (ref 70–99)
Potassium: 3.1 mmol/L — ABNORMAL LOW (ref 3.5–5.1)
Sodium: 133 mmol/L — ABNORMAL LOW (ref 135–145)

## 2021-02-09 LAB — CBC
HCT: 24.8 % — ABNORMAL LOW (ref 36.0–46.0)
Hemoglobin: 8.5 g/dL — ABNORMAL LOW (ref 12.0–15.0)
MCH: 35.4 pg — ABNORMAL HIGH (ref 26.0–34.0)
MCHC: 34.3 g/dL (ref 30.0–36.0)
MCV: 103.3 fL — ABNORMAL HIGH (ref 80.0–100.0)
Platelets: 42 10*3/uL — ABNORMAL LOW (ref 150–400)
RBC: 2.4 MIL/uL — ABNORMAL LOW (ref 3.87–5.11)
RDW: 15.9 % — ABNORMAL HIGH (ref 11.5–15.5)
WBC: 7.8 10*3/uL (ref 4.0–10.5)
nRBC: 0 % (ref 0.0–0.2)

## 2021-02-09 LAB — HIV ANTIBODY (ROUTINE TESTING W REFLEX): HIV Screen 4th Generation wRfx: NONREACTIVE

## 2021-02-09 LAB — PATHOLOGIST SMEAR REVIEW

## 2021-02-09 IMAGING — CT CT ABD-PELV W/ CM
2 of 5 series · 15 of 46 positions shown, 17 images · IV contrast (APPLIED)
Comparison: None.

CLINICAL DATA: Abdominal pain with nausea vomiting

EXAM:
CT ABDOMEN AND PELVIS WITH CONTRAST
TECHNIQUE: Multidetector CT imaging of the abdomen and pelvis was performed
using the standard protocol following bolus administration of
intravenous contrast.
CONTRAST:  75mL OMNIPAQUE IOHEXOL 300 MG/ML  SOLN

[Series 2: axial st · axial · 0.65mm/px · z∈[-647,-262]mm · 12 of 92 slices shown, 14 images]
[im 8/92  soft-tissue]
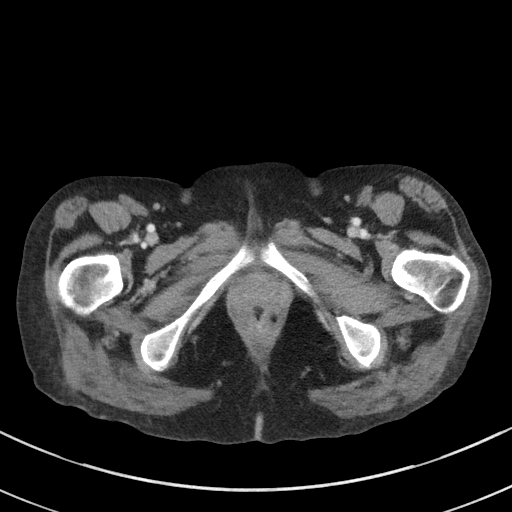
[im 8/92  bone]
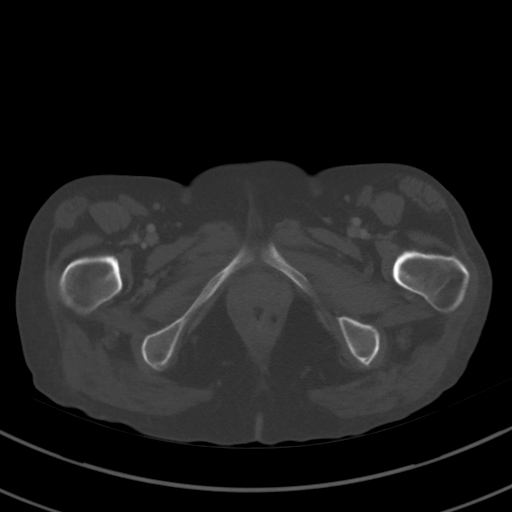
[im 15/92  soft-tissue]
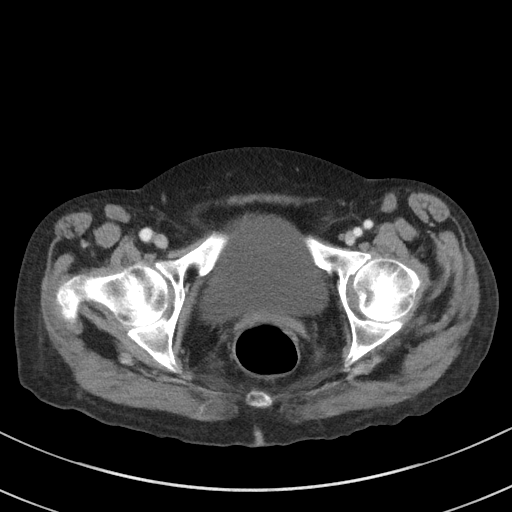
[im 22/92  soft-tissue]
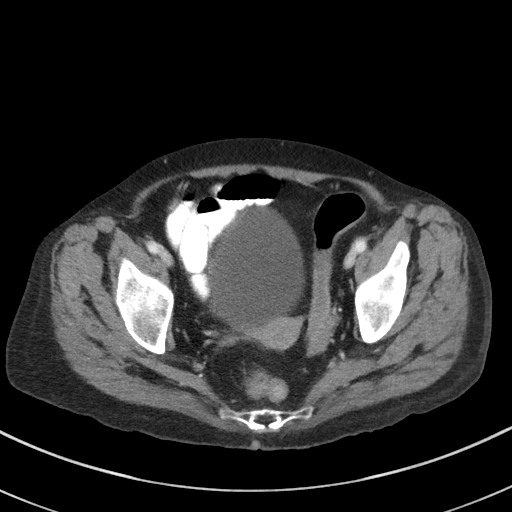
[im 29/92  soft-tissue]
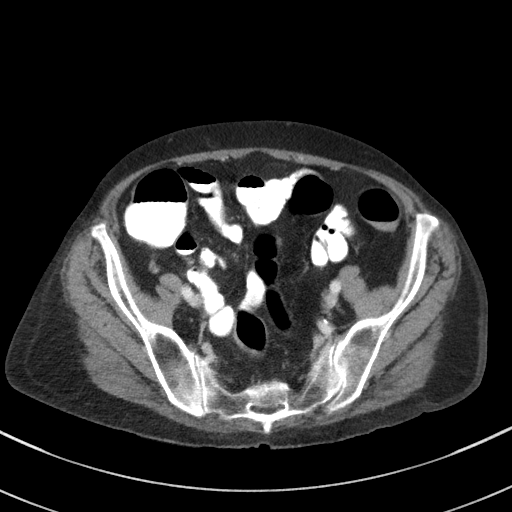
[im 36/92  soft-tissue]
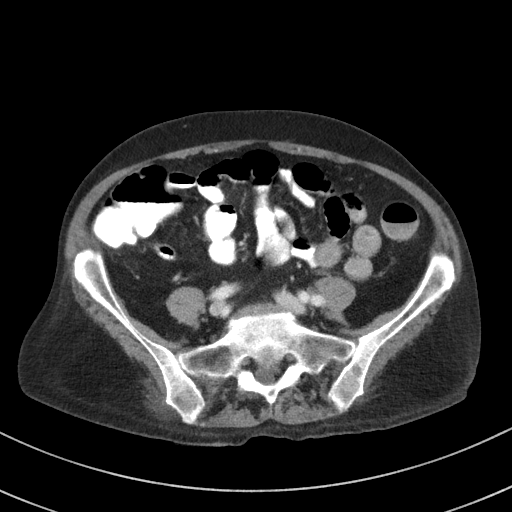
[im 43/92  soft-tissue]
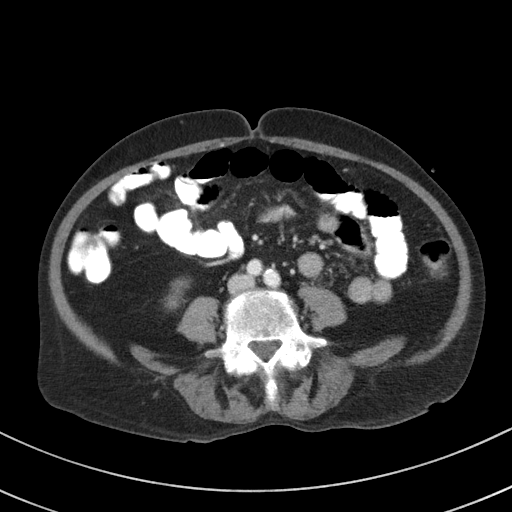
[im 50/92  soft-tissue]
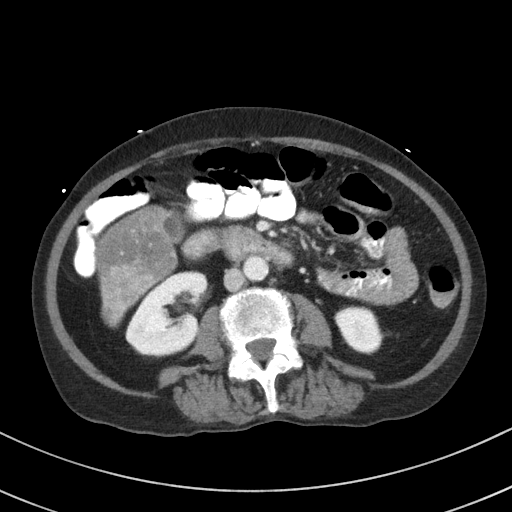
[im 57/92  soft-tissue]
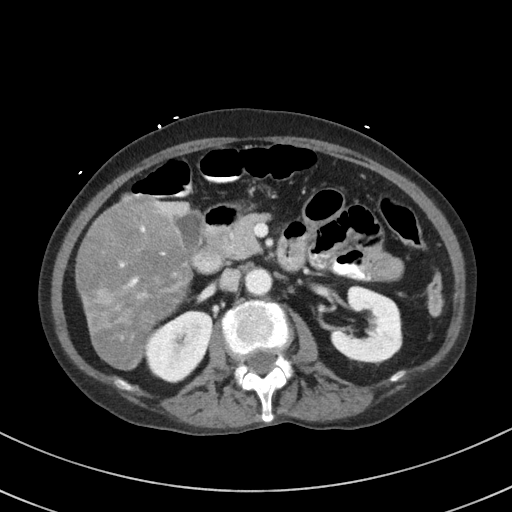
[im 64/92  soft-tissue]
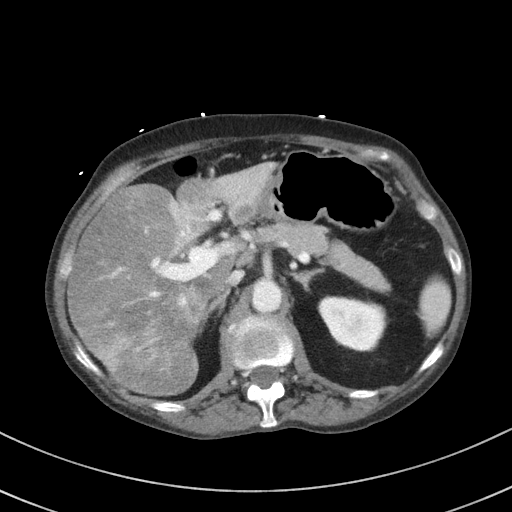
[im 64/92  bone]
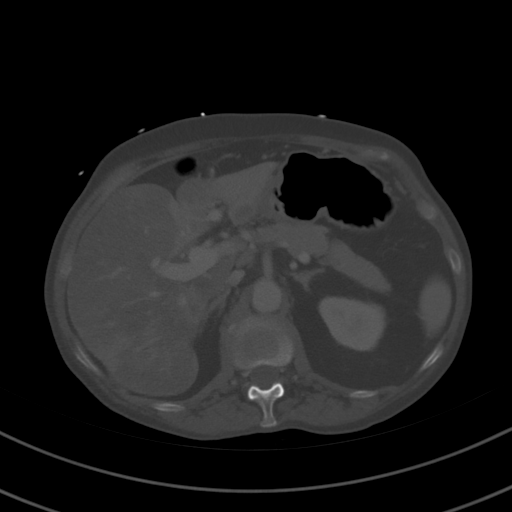
[im 71/92  soft-tissue]
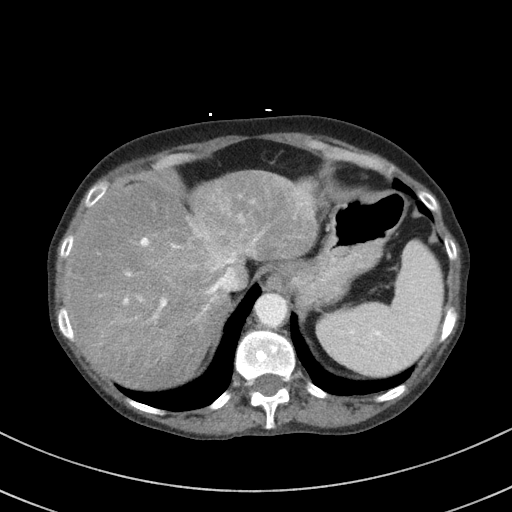
[im 78/92  soft-tissue]
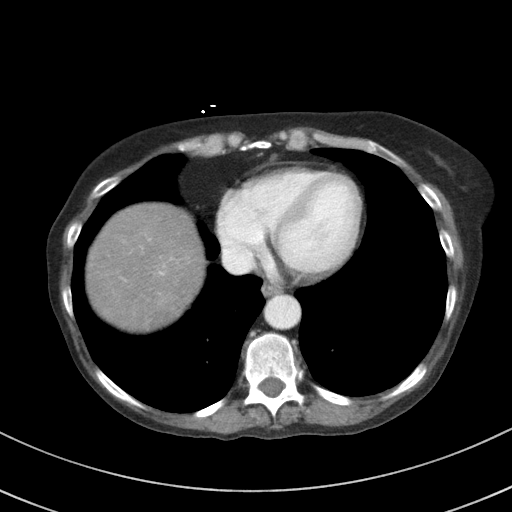
[im 85/92  soft-tissue]
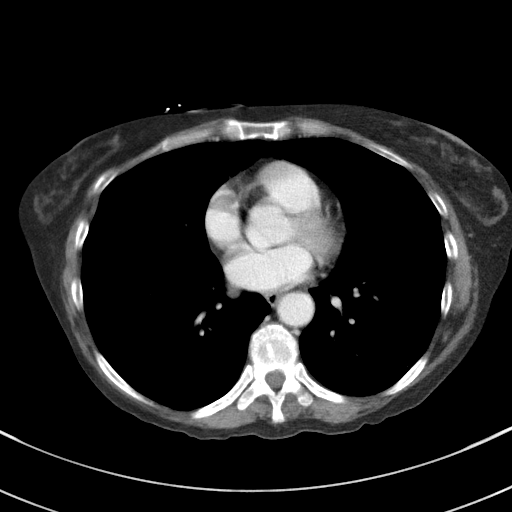

[Series 5: coronal st · coronal · 0.62mm/px · 3 of 76 slices shown]
[im 26/76  soft-tissue]
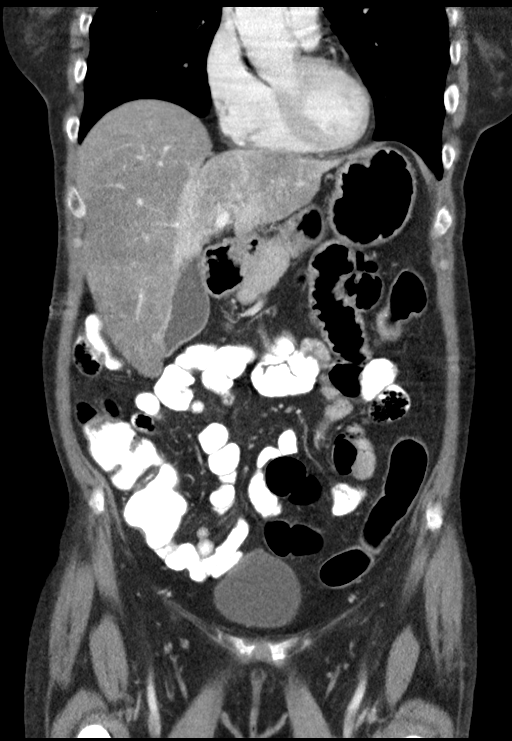
[im 34/76  soft-tissue]
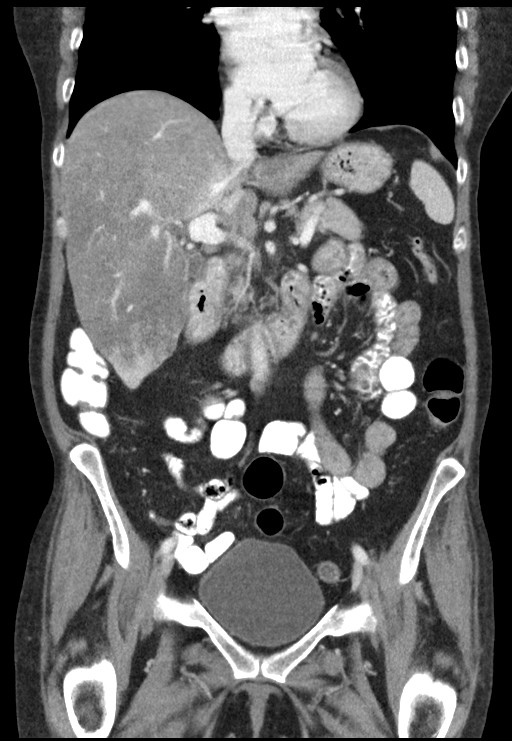
[im 42/76  soft-tissue]
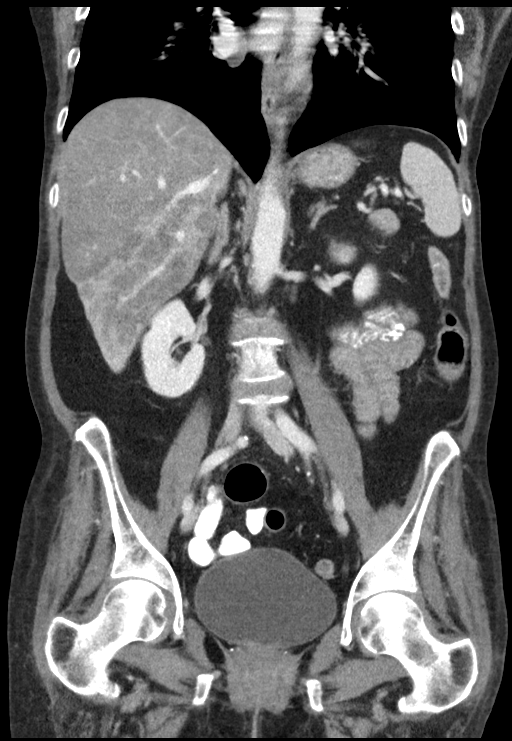

[15 of 46 positions shown; findings below may reference images not displayed]

FINDINGS: Lower chest: No acute abnormality. Normal size heart. Left anterior
descending coronary artery calcifications. No significant
pericardial effusion/thickening.

Hepatobiliary: Hepatomegaly measuring 18.2 cm in maximum
craniocaudal dimension. Diffusely decreased attenuation of the
hepatic parenchyma with geographic regions of hyperattenuation
involving the gallbladder fossa anterior left lobe of the liver and
posterior right lobe of the liver, which persist on delayed imaging,
for instance on image [DATE] and 32/2. Gallbladder is grossly
unremarkable. No biliary ductal dilation.

Pancreas: Within normal limits.

Spleen: Calcified splenic granulomata.  Normal size spleen.

Adrenals/Urinary Tract: Adrenal glands are unremarkable. Kidneys are
normal, without renal calculi, focal lesion, or hydronephrosis.
Bladder is unremarkable.

Stomach/Bowel: Mild symmetric thickening of the gastric antrum.
Normal positioning of the duodenum/ligament of Treitz. Radiopaque
enteric contrast traverses the hepatic flexure. No pathologic
dilation of small bowel. Normal appendix. Gas and fluid visualized
throughout the colon.

Vascular/Lymphatic: Aortic atherosclerosis without aneurysmal
dilation. The hepatic, portal and splenic veins appear patent.
Prominent periportal lymph nodes measuring up to 5 mm on image 32/2,
likely reactive. No pathologically enlarged abdominal or pelvic
lymph nodes.

Reproductive: Status post hysterectomy. No adnexal masses.

Other: No abdominopelvic ascites.

Musculoskeletal: Mild multilevel degenerative changes spine. No
aggressive lytic or blastic lesion of bone.
IMPRESSION: 1. Hepatomegaly with diffuse hepatic hypoattenuation and
superimposed geographic regions of hyperattenuation involving the
gallbladder fossa, anterior left lobe of the liver and posterior
right lobe of the liver, which persist on delayed imaging. Findings
which likely reflect hepatic steatosis with focal fatty sparing.
2. Mild symmetric thickening of the gastric antrum, which may
represent gastritis.
3. Gas and fluid visualized throughout the colon suggestive of
diarrheal state. No evidence of bowel obstruction.
4. Aortic atherosclerosis.  Aortic Atherosclerosis ([YG]-[YG]).

## 2021-02-09 MED ORDER — VITAMIN B-12 1000 MCG PO TABS
500.0000 ug | ORAL_TABLET | Freq: Every day | ORAL | Status: DC
  Administered 2021-02-09 – 2021-02-13 (×5): 500 ug via ORAL
  Filled 2021-02-09 (×5): qty 1

## 2021-02-09 MED ORDER — SODIUM CHLORIDE (PF) 0.9 % IJ SOLN
INTRAMUSCULAR | Status: AC
  Filled 2021-02-09: qty 50

## 2021-02-09 MED ORDER — IOHEXOL 300 MG/ML  SOLN: 100.0000 mL | Freq: Once | INTRAMUSCULAR | Status: DC | PRN

## 2021-02-09 MED ORDER — FOLIC ACID 5 MG/ML IJ SOLN
1.0000 mg | Freq: Every day | INTRAMUSCULAR | Status: DC
  Administered 2021-02-09: 1 mg via INTRAVENOUS
  Filled 2021-02-09 (×2): qty 0.2

## 2021-02-09 MED ORDER — IOHEXOL 9 MG/ML PO SOLN
ORAL | Status: AC
  Administered 2021-02-09: 500 mL
  Filled 2021-02-09: qty 1000

## 2021-02-09 MED ORDER — METOPROLOL TARTRATE 25 MG PO TABS: 25.0000 mg | ORAL_TABLET | Freq: Two times a day (BID) | ORAL | Status: DC

## 2021-02-09 MED ORDER — PANTOPRAZOLE SODIUM 40 MG IV SOLR
40.0000 mg | Freq: Two times a day (BID) | INTRAVENOUS | Status: DC
  Administered 2021-02-09 (×2): 40 mg via INTRAVENOUS
  Filled 2021-02-09 (×2): qty 40

## 2021-02-09 MED ORDER — IOHEXOL 300 MG/ML  SOLN
75.0000 mL | Freq: Once | INTRAMUSCULAR | Status: AC | PRN
  Administered 2021-02-09: 75 mL via INTRAVENOUS

## 2021-02-09 MED ORDER — HYDRALAZINE HCL 25 MG PO TABS: 25.0000 mg | ORAL_TABLET | Freq: Three times a day (TID) | ORAL | Status: DC | PRN

## 2021-02-09 MED ORDER — SODIUM CHLORIDE 0.9 % IV SOLN: INTRAVENOUS | Status: DC

## 2021-02-09 MED ORDER — SODIUM CHLORIDE 0.9 % IV SOLN
1.0000 g | INTRAVENOUS | Status: AC
  Administered 2021-02-09 – 2021-02-12 (×4): 1 g via INTRAVENOUS
  Filled 2021-02-09 (×3): qty 1
  Filled 2021-02-09: qty 10

## 2021-02-09 NOTE — Progress Notes (Addendum)
PROGRESS NOTE    [name] [name]  SWN:[id number] DOB: 1961/01/09 DOA: 02/08/2021 PCP: Pcp, No   Brief Narrative: 60 year old with no significant past medical history who presents complaining of nausea vomiting and vision changes and weakness.  She reports intermittent nausea and vomiting for the last 3 months.  Reports intermittent blurry vision.  She denies headache.  She reports worsening numbness and tingling in her hands and feet.  She feels weak all over.  Evaluation in the ED: Patient was found to have a potassium of 2.4.  UA consistent with UTI.     Assessment & Plan:   Active Problems:   Hypokalemia  1-Hypokalemia, Hyponatremia, Hypocalcemia: -Patient received IV potassium and oral potassium. -K increase to 3.5.  -plan to continue with oral potassium.  Received calcium supplement. Start oral calcium   2-Anemia; Thrombocytopenia;  Folic acid low. Replete IV.  Thrombocytopenia could de related to infection, HO alcohol use.  Peripheral smear. Macrocytic anemia, thrombocytopenia.   3-Epigastric pain;  CT abdomen ; possible gastritis,  Started PPI.   4-UTI; Continue with ceftriaxone.  Follow urine culture.   5-Neuropathy;  Numbness and tingling stable.  Start B12 and folic acid.  MRI negative for stroke. Discussed with neurology MRI finding are non specific, appears more related to chronic vascular diseases than MS. Needs follow up with neurologist out patient.   Mild transaminases; CT hepatic steatosis. Hepatomegaly.  HTN; start metoprolol. PRN hydralazine.   Estimated body mass index is 20.78 kg/m as calculated from the following:   Height as of this encounter: 5\' 1"  (1.549 m).   Weight as of this encounter: 49.9 kg.   DVT prophylaxis: SCD Code Status: Partial Code; no intubation  Family Communication: Care discussed with patient.  Disposition Plan:  Status is: Inpatient  Remains inpatient appropriate because:IV treatments appropriate due to intensity of  illness or inability to take PO  Dispo: The patient is from:               Anticipated d/c is to: Home              Patient currently is not medically stable to d/c.   Difficult to place patient No        Consultants:  Neurology phone   Procedures:  None  Antimicrobials:    Subjective: She is feeling better, less weak. Still with numbness hands and feet.    Objective: Vitals:   02/08/21 2100 02/08/21 2200 02/08/21 2256 02/09/21 0524  BP: (!) 140/96 (!) 144/98 (!) 153/104 (!) 134/94  Pulse: 98 90 90 85  Resp: 12 13  12   Temp:  (!) 97.5 F (36.4 C) 99.3 F (37.4 C) 98 F (36.7 C)  TempSrc:  Oral Oral Oral  SpO2: 100% 100% 100% 99%  Weight:      Height:        Intake/Output Summary (Last 24 hours) at 02/09/2021 0926 Last data filed at 02/08/2021 2210 Gross per 24 hour  Intake 50 ml  Output --  Net 50 ml   Filed Weights   02/08/21 1210  Weight: 49.9 kg    Examination:  General exam: Appears calm and comfortable  Respiratory system: Clear to auscultation. Respiratory effort normal. Cardiovascular system: S1 & S2 heard, RRR. No JVD, murmurs, rubs, gallops or clicks. No pedal edema. Gastrointestinal system: Abdomen is nondistended, soft and nontender. No organomegaly or masses felt. Normal bowel sounds heard. Central nervous system: Alert and oriented. No focal neurological deficits. Extremities: Symmetric 5 x 5  power.    Data Reviewed: I have personally reviewed following labs and imaging studies  CBC: Recent Labs  Lab 02/08/21 1253 02/09/21 0334  WBC 11.9* 7.8  NEUTROABS 9.7*  --   HGB 10.1* 8.5*  HCT 28.5* 24.8*  MCV 102.2* 103.3*  PLT 53* 42*   Basic Metabolic Panel: Recent Labs  Lab 02/08/21 1253 02/08/21 2150 02/09/21 0334  NA 134*  --  135  K 2.4*  --  3.5  CL 96*  --  101  CO2 24  --  22  GLUCOSE 136*  --  101*  BUN 7  --  <5*  CREATININE 0.67  --  0.42*  CALCIUM 8.2*  --  8.0*  MG 1.7 2.1  --    GFR: Estimated Creatinine  Clearance: 57.1 mL/min (A) (by C-G formula based on SCr of 0.42 mg/dL (L)). Liver Function Tests: Recent Labs  Lab 02/08/21 1253 02/08/21 2150 02/09/21 0334  AST 62*  --  50*  ALT 34  --  28  ALKPHOS 79  --  69  BILITOT 1.4* 1.1 0.8  PROT 7.2  --  6.2*  ALBUMIN 3.7  --  3.0*   Recent Labs  Lab 02/08/21 1253  LIPASE 51   No results for input(s): AMMONIA in the last 168 hours. Coagulation Profile: No results for input(s): INR, PROTIME in the last 168 hours. Cardiac Enzymes: No results for input(s): CKTOTAL, CKMB, CKMBINDEX, TROPONINI in the last 168 hours. BNP (last 3 results) No results for input(s): PROBNP in the last 8760 hours. HbA1C: No results for input(s): HGBA1C in the last 72 hours. CBG: No results for input(s): GLUCAP in the last 168 hours. Lipid Profile: No results for input(s): CHOL, HDL, LDLCALC, TRIG, CHOLHDL, LDLDIRECT in the last 72 hours. Thyroid Function Tests: Recent Labs    02/08/21 1438  TSH 1.081   Anemia Panel: Recent Labs    02/08/21 1602 02/08/21 1603  VITAMINB12 374  --   FOLATE  --  1.8*  TIBC 156*  --   IRON 139  --    Sepsis Labs: No results for input(s): PROCALCITON, LATICACIDVEN in the last 168 hours.  Recent Results (from the past 240 hour(s))  Resp Panel by RT-PCR (Flu A&B, Covid) Nasopharyngeal Swab     Status: None   Collection Time: 02/08/21 12:53 PM   Specimen: Nasopharyngeal Swab; Nasopharyngeal(NP) swabs in vial transport medium  Result Value Ref Range Status   SARS Coronavirus 2 by RT PCR NEGATIVE NEGATIVE Final    Comment: (NOTE) SARS-CoV-2 target nucleic acids are NOT DETECTED.  The SARS-CoV-2 RNA is generally detectable in upper respiratory specimens during the acute phase of infection. The lowest concentration of SARS-CoV-2 viral copies this assay can detect is 138 copies/mL. A negative result does not preclude SARS-Cov-2 infection and should not be used as the sole basis for treatment or other patient  management decisions. A negative result may occur with  improper specimen collection/handling, submission of specimen other than nasopharyngeal swab, presence of viral mutation(s) within the areas targeted by this assay, and inadequate number of viral copies(<138 copies/mL). A negative result must be combined with clinical observations, patient history, and epidemiological information. The expected result is Negative.  Fact Sheet for Patients:  BloggerCourse.com  Fact Sheet for Healthcare Providers:  SeriousBroker.it  This test is no t yet approved or cleared by the [place] FDA and  has been authorized for detection and/or diagnosis of SARS-CoV-2 by FDA under an Emergency Use Authorization (  EUA). This EUA will remain  in effect (meaning this test can be used) for the duration of the COVID-19 declaration under Section 564(b)(1) of the Act, 21 U.S.C.section 360bbb-3(b)(1), unless the authorization is terminated  or revoked sooner.       Influenza A by PCR NEGATIVE NEGATIVE Final   Influenza B by PCR NEGATIVE NEGATIVE Final    Comment: (NOTE) The Xpert Xpress SARS-CoV-2/FLU/RSV plus assay is intended as an aid in the diagnosis of influenza from Nasopharyngeal swab specimens and should not be used as a sole basis for treatment. Nasal washings and aspirates are unacceptable for Xpert Xpress SARS-CoV-2/FLU/RSV testing.  Fact Sheet for Patients: BloggerCourse.com  Fact Sheet for Healthcare Providers: SeriousBroker.it  This test is not yet approved or cleared by the [place] FDA and has been authorized for detection and/or diagnosis of SARS-CoV-2 by FDA under an Emergency Use Authorization (EUA). This EUA will remain in effect (meaning this test can be used) for the duration of the COVID-19 declaration under Section 564(b)(1) of the Act, 21 U.S.C. section 360bbb-3(b)(1),  unless the authorization is terminated or revoked.  Performed at [facility], [address], [place], [place] [address]          Radiology Studies: CT HEAD WO CONTRAST  Result Date: 02/08/2021 CLINICAL DATA:  Acute neuro deficit.  Vision change.  Unsteady gait EXAM: CT HEAD WITHOUT CONTRAST TECHNIQUE: Contiguous axial images were obtained from the base of the skull through the vertex without intravenous contrast. COMPARISON:  None. FINDINGS: Brain: Generalized atrophy. Periventricular white matter hypodensity bilaterally appears symmetric. Negative for acute infarct, hemorrhage, mass. 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 Electronically Signed   By: Marlan Palau M.D.   On: 02/08/2021 13:34   MR BRAIN WO CONTRAST  Result Date: 02/08/2021 CLINICAL DATA:  Transient ischemic attack. EXAM: MRI HEAD WITHOUT CONTRAST TECHNIQUE: Multiplanar, multiecho pulse sequences of the brain and surrounding structures were obtained without intravenous contrast. COMPARISON:  Same day CT head. FINDINGS: Brain: No acute infarction, hemorrhage, hydrocephalus, extra-axial collection or mass lesion. Moderate atrophy with ventriculomegaly. Cerebellar tonsils are in normal position. Unremarkable sella. Vascular: Major arterial flow voids are maintained at the skull base. Skull and upper cervical spine: Normal marrow signal. Sinuses/Orbits: Mild ethmoid air cell mucosal thickening. No air-fluid levels. Unremarkable orbits. Other: No sizable mastoid effusions. IMPRESSION: 1. No acute infarct. 2. Ventriculomegaly, which may be ex vacuo in the setting of moderate atrophy. Normal pressure hydrocephalus could have a similar appearance in the correct clinical setting. 3. Mild to moderate T2/FLAIR hyperintensities within the white matter. This finding is  nonspecific but can be seen in the setting of chronic microvascular ischemia, a demyelinating process such as multiple sclerosis, chronic migraines, or as the sequelae of a prior infectious or inflammatory process. Electronically Signed   By: Feliberto Harts MD   On: 02/08/2021 17:34        Scheduled Meds:  folic acid  1 mg Intravenous Daily   pantoprazole (PROTONIX) IV  40 mg Intravenous Q12H   potassium chloride SA  40 mEq Oral Daily   Continuous Infusions:  sodium chloride     cefTRIAXone (ROCEPHIN)  IV       LOS: 1 day    Time spent: 35 minutes.     Alba Cory, MD [facility]   If 7PM-7AM, please contact night-coverage [email or web address]  02/09/2021, 9:26 AM

## 2021-02-09 NOTE — Plan of Care (Signed)

## 2021-02-10 DIAGNOSIS — D649 Anemia, unspecified: Secondary | ICD-10-CM

## 2021-02-10 DIAGNOSIS — N390 Urinary tract infection, site not specified: Secondary | ICD-10-CM

## 2021-02-10 DIAGNOSIS — D696 Thrombocytopenia, unspecified: Secondary | ICD-10-CM

## 2021-02-10 LAB — BASIC METABOLIC PANEL
Anion gap: 10 (ref 5–15)
BUN: <5 mg/dL — ABNORMAL LOW (ref 6–20)
CO2: 23 mmol/L (ref 22–32)
Calcium: 8.1 mg/dL — ABNORMAL LOW (ref 8.9–10.3)
Chloride: 103 mmol/L (ref 98–111)
Creatinine, Ser: 0.45 mg/dL (ref 0.44–1.00)
GFR, Estimated: >60 mL/min (ref >60)
Glucose, Bld: 112 mg/dL — ABNORMAL HIGH (ref 70–99)
Potassium: 3.3 mmol/L — ABNORMAL LOW (ref 3.5–5.1)
Sodium: 136 mmol/L (ref 135–145)

## 2021-02-10 LAB — URINE CULTURE: Culture: >=100000 — AB

## 2021-02-10 LAB — CBC
HCT: 24.7 % — ABNORMAL LOW (ref 36.0–46.0)
Hemoglobin: 8.4 g/dL — ABNORMAL LOW (ref 12.0–15.0)
MCH: 35.1 pg — ABNORMAL HIGH (ref 26.0–34.0)
MCHC: 34 g/dL (ref 30.0–36.0)
MCV: 103.3 fL — ABNORMAL HIGH (ref 80.0–100.0)
Platelets: 38 10*3/uL — ABNORMAL LOW (ref 150–400)
RBC: 2.39 MIL/uL — ABNORMAL LOW (ref 3.87–5.11)
RDW: 15.7 % — ABNORMAL HIGH (ref 11.5–15.5)
WBC: 6.6 10*3/uL (ref 4.0–10.5)
nRBC: 0 % (ref 0.0–0.2)

## 2021-02-10 LAB — C DIFFICILE QUICK SCREEN W PCR REFLEX
C Diff antigen: NEGATIVE
C Diff interpretation: NOT DETECTED
C Diff toxin: NEGATIVE

## 2021-02-10 MED ORDER — TRAMADOL HCL 50 MG PO TABS
50.0000 mg | ORAL_TABLET | Freq: Four times a day (QID) | ORAL | Status: DC | PRN
  Administered 2021-02-10 – 2021-02-26 (×19): 50 mg via ORAL
  Filled 2021-02-10 (×19): qty 1

## 2021-02-10 MED ORDER — CALCIUM CARBONATE 1250 (500 CA) MG PO TABS
1.0000 | ORAL_TABLET | Freq: Two times a day (BID) | ORAL | Status: DC
  Administered 2021-02-10 – 2021-02-27 (×33): 500 mg via ORAL
  Filled 2021-02-10 (×33): qty 1

## 2021-02-10 MED ORDER — PANTOPRAZOLE SODIUM 40 MG PO TBEC
40.0000 mg | DELAYED_RELEASE_TABLET | Freq: Two times a day (BID) | ORAL | Status: DC
  Administered 2021-02-10 – 2021-02-27 (×35): 40 mg via ORAL
  Filled 2021-02-10 (×36): qty 1

## 2021-02-10 MED ORDER — FOLIC ACID 1 MG PO TABS
1.0000 mg | ORAL_TABLET | Freq: Every day | ORAL | Status: DC
  Administered 2021-02-10 – 2021-02-27 (×18): 1 mg via ORAL
  Filled 2021-02-10 (×18): qty 1

## 2021-02-10 MED ORDER — POTASSIUM CHLORIDE CRYS ER 20 MEQ PO TBCR
40.0000 meq | EXTENDED_RELEASE_TABLET | ORAL | Status: AC
  Administered 2021-02-10 (×3): 40 meq via ORAL
  Filled 2021-02-10 (×3): qty 2

## 2021-02-10 NOTE — Evaluation (Signed)
Occupational Therapy Evaluation Patient Details Name: [name] [name] MRN: [id number] DOB: Aug 08, 1961 Today's Date: 02/10/2021    History of Present Illness 60 year old with no significant past medical history who presents complaining of nausea vomiting and vision changes and weakness.  She reports intermittent nausea and vomiting for the last 3 months.  Reports intermittent blurry vision. She reports worsening numbness and tingling in her hands and feet. She feels weak all over.  Pt found to be hypokalemic and with UTI. MRI revealed: 1. No acute infarct.  2. Ventriculomegaly, which may be ex vacuo in the setting of  moderate atrophy. Normal pressure hydrocephalus could have a similar  appearance in the correct clinical setting.  3. Mild to moderate T2/FLAIR hyperintensities within the white  matter. This finding is nonspecific but can be seen in the setting  of chronic microvascular ischemia, a demyelinating process such as multiple sclerosis, chronic migraines, or as the sequelae of a prior  infectious or inflammatory process.   Clinical Impression   Patient is currently requiring assistance with ADLs including minimal assist with toileting hygiene and moderate assist with toilet transfer to ambulate to bathroom, mod to maximum assist with LE dressing in standing, minimal assist with bathing, and min guard with UE dressing in sitting, all of which is below patient's typical baseline of being Independent.  During this evaluation, patient was limited by poor standing balance with "numbness" to bil feet as well as diminished light touch to hands, acute blurred vision, and generalized weakness, which has the potential to impact patient's safety and independence during functional mobility, as well as performance for ADLs. [employer] AM-PAC "6-clicks" Daily Activity Inpatient Short Form score of 17/24 indicates 50.11% ADL impairment this session. Patient lives alone and has a sister in [place] who will  stay with her "a couple of days" after d/c then just PRN supervision and assistance from a friend.  Patient demonstrates good rehab potential, and should benefit from continued skilled occupational therapy services while in acute care to maximize safety, independence and quality of life at home.  Continued occupational therapy services in a CIR setting prior to return home is recommended.  ?    Follow Up Recommendations  CIR (Pt may refuse CIR. If so, recommend home health OT and 24 hour supervision.)    Equipment Recommendations  3 in 1 bedside commode    Recommendations for Other Services Rehab consult     Precautions / Restrictions Precautions Precautions: Fall Restrictions Weight Bearing Restrictions: No      Mobility Bed Mobility Overal bed mobility: Modified Independent                  Transfers Overall transfer level: Needs assistance   Transfers: Sit to/from Stand;Stand Pivot Transfers Sit to Stand: Min assist Stand pivot transfers: Mod assist       General transfer comment: Pt ambulated in room first without RW with with need of unilateral HHA/Mod As with narrow BOS and pt reporting feeling unaware of where her feet are. Pt progressed to Min As with RW, but remained very unsteady.    Balance Overall balance assessment: History of Falls;Needs assistance (No falls until day of admission, then 2) Sitting-balance support: No upper extremity supported;Feet supported Sitting balance-Leahy Scale: Good       Standing balance-Leahy Scale: Poor Standing balance comment: Unsteady with need of external assistance.  ADL either performed or assessed with clinical judgement   ADL Overall ADL's : Needs assistance/impaired Eating/Feeding: Modified independent   Grooming: Sitting;Set up   Upper Body Bathing: Set up;Sitting   Lower Body Bathing: Minimal assistance;Sitting/lateral leans;Sit to/from stand   Upper Body Dressing  : Set up;Sitting   Lower Body Dressing: Sit to/from stand;Minimal assistance;Sitting/lateral leans;Maximal assistance Lower Body Dressing Details (indicate cue type and reason): Min Assist sitting, Maximum assist standing. Toilet Transfer: Moderate assistance   Toileting- Clothing Manipulation and Hygiene: Moderate assistance       Functional mobility during ADLs: Modified independent;Minimal assistance;Moderate assistance;Rolling walker General ADL Comments: Mod I bed; Min As with RW to ambulate, Mod assist without RW.     Vision Baseline Vision/History: Wears glasses Wears Glasses: Reading only Patient Visual Report: Blurring of vision Additional Comments: Pt reports acute vision changes with blurry vision.     Perception     Praxis      Pertinent Vitals/Pain Pain Assessment: No/denies pain     Hand Dominance Right   Extremity/Trunk Assessment Upper Extremity Assessment Upper Extremity Assessment: Generalized weakness;RUE deficits/detail RUE Sensation: decreased light touch (D2 PIP proprioception intact) LUE Sensation: decreased light touch (D2 PIP proprioception intact)   Lower Extremity Assessment Lower Extremity Assessment: Defer to PT evaluation (Reports greater numbness and parathesias to BLEs more than hands.)   Cervical / Trunk Assessment Cervical / Trunk Assessment: Normal   Communication Communication Communication: No difficulties   Cognition Arousal/Alertness: Awake/alert Behavior During Therapy: WFL for tasks assessed/performed;Flat affect Overall Cognitive Status: Within Functional Limits for tasks assessed                                 General Comments: Ox4   General Comments       Exercises     Shoulder Instructions      Home Living Family/patient expects to be discharged to:: Private residence Living Arrangements: Alone Available Help at Discharge: Available 24 hours/day;Family (Sister coming in from [place] area "for a  couple of days". Then a friend will be available PRN.) Type of Home: House Home Access: Stairs to enter [employer] of Steps: 2 Entrance Stairs-Rails: None Home Layout: One level     Bathroom Shower/Tub: Walk-in shower (Also has tub/shower in 2nd bathroom)   Bathroom Toilet: Standard     Home Equipment: Grab bars - tub/shower;Bedside commode;Walker - 2 wheels;Shower seat;Hand held shower head          Prior Functioning/Environment Level of Independence: Independent        Comments: Pt drives, Independent with all ADLs and IADLs. Pt is retired Runner, broadcasting/film/video.        OT Problem List: Impaired vision/perception;Impaired balance (sitting and/or standing);Decreased knowledge of use of DME or AE;Decreased activity tolerance;Impaired sensation;Decreased coordination;Decreased strength      OT Treatment/Interventions: Self-care/ADL training;Therapeutic exercise;Therapeutic activities;Neuromuscular education;Energy conservation;Visual/perceptual remediation/compensation;Patient/family education;DME and/or AE instruction;Balance training    OT Goals(Current goals can be found in the care plan section) Acute Rehab OT Goals Patient Stated Goal: To go home. OT Goal Formulation: With patient Time For Goal Achievement: 02/24/21 Potential to Achieve Goals: Good ADL Goals Pt Will Perform Grooming: standing;with modified independence Pt Will Perform Lower Body Dressing: with modified independence;with adaptive equipment;sitting/lateral leans;sit to/from stand Pt Will Transfer to Toilet: with supervision;ambulating;regular height toilet Pt Will Perform Toileting - Clothing Manipulation and hygiene: with modified independence;sitting/lateral leans Additional ADL Goal #1: Patient will identify at least 3  energy conservation strategies to employ at home in order to maximize function and quality of life and decrease caregiver burden while preventing exacerbation of symptoms and  rehospitalization.  OT Frequency: Min 2X/week   Barriers to D/C: Decreased caregiver support          Co-evaluation              AM-PAC OT "6 Clicks" Daily Activity     Outcome Measure Help from another person eating meals?: None Help from another person taking care of personal grooming?: A Little Help from another person toileting, which includes using toliet, bedpan, or urinal?: A Lot Help from another person bathing (including washing, rinsing, drying)?: A Little Help from another person to put on and taking off regular upper body clothing?: A Little Help from another person to put on and taking off regular lower body clothing?: A Lot 6 Click Score: 17   End of Session Equipment Utilized During Treatment: Gait belt;Rolling walker  Activity Tolerance: Patient tolerated treatment well;Patient limited by fatigue Patient left: in bed;with call bell/phone within reach;with bed alarm set  OT Visit Diagnosis: Unsteadiness on feet (R26.81);Other abnormalities of gait and mobility (R26.89);Other symptoms and signs involving the nervous system (R29.898);History of falling (Z91.81);Repeated falls (R29.6);Low vision, both eyes (H54.2)                Time: 0109-3235 OT Time Calculation (min): 24 min Charges:  OT General Charges $OT Visit: 1 Visit OT Evaluation $OT Eval Low Complexity: 1 Low OT Treatments $Self Care/Home Management : 8-22 mins  Victorino Dike, OT Acute Rehab Services Office: [phone] 02/10/2021  Theodoro Clock 02/10/2021, 12:47 PM

## 2021-02-10 NOTE — Consult Note (Signed)
[facility] [phone]   [phone]  CONSULT NOTE  REFERRING PHYSICIAN: Dr. Niel Hummer  REASON FOR CONSULTATION:  60 years old white female with severe anemia and thrombocytopenia  HPI [name] [name] is a 60 y.o. female with no significant past medical history except for history of alcohol abuse and she quit around 2 weeks ago.  The patient also has a history of anemia since her teenager.  She presented to the emergency department complaining of persistent nausea and vomiting as well as generalized weakness and visual changes.  She has not seen by any medical provider for several years.  She was also complaining of numbness and weakness in her hands and feet.  The patient had several studies performed on admission including CBC that showed white blood count of 11.9, hemoglobin 10.1 and hematocrit 28.5 with platelet count of 53,000.  She started on IV hydration and repeat CBC on 02/10/2021 showed normal white blood count of 6.6 but her hemoglobin was down to 8.4, hematocrit 24.7 and platelets count 38,000.  Several other studies including HIV was negative.  She has low serum folate of 1.8 and normal vitamin B12 of 374.  Her iron was normal at 139 with iron saturation of 89%.  She was also found to have hypokalemia.  Because of the decline in her hemoglobin and hematocrit as well as the platelets count, I was consulted for evaluation and recommendation regarding her condition. When seen today she is feeling much better with no more nausea or vomiting.  She denied having any abdominal pain.  She denied having any fever or chills.  She has no chest pain, shortness of breath, cough or hemoptysis.  She has no current headache. Family history is unremarkable for any blood disease. The patient is single and has no children.  She has a sister who lives in [place].  She used to work as a Pharmacist, hospital for [employer].  The  patient denied having any current smoking or drug abuse but she has a history of alcohol abuse.  HPI  Social History Social History   Tobacco Use   Smoking status: Never   Smokeless tobacco: Never  Substance Use Topics   Alcohol use: Yes    Comment: spcoa;   Drug use: Never    No Known Allergies  Current Facility-Administered Medications  Medication Dose Route Frequency Provider Last Rate Last Admin   acetaminophen (TYLENOL) tablet 650 mg  650 mg Oral Q6H PRN Marylyn Ishihara, Tyrone A, DO   650 mg at 02/10/21 2122   Or   acetaminophen (TYLENOL) suppository 650 mg  650 mg Rectal Q6H PRN Marylyn Ishihara, Tyrone A, DO       cefTRIAXone (ROCEPHIN) 1 g in sodium chloride 0.9 % 100 mL IVPB  1 g Intravenous Q24H Kyle, Tyrone A, DO 200 mL/hr at 02/09/21 1527 1 g at 48/25/00 3704   folic acid (FOLVITE) tablet 1 mg  1 mg Oral Daily Adrian Saran, RPH   1 mg at 02/10/21 1007   hydrALAZINE (APRESOLINE) tablet 25 mg  25 mg Oral Q8H PRN Regalado, Belkys A, MD       LORazepam (ATIVAN) tablet 0.5 mg  0.5 mg Oral Q6H PRN Marylyn Ishihara, Tyrone A, DO       pantoprazole (PROTONIX) EC tablet 40 mg  40 mg Oral BID Adrian Saran, RPH   40 mg at 02/10/21 1007   potassium chloride SA (KLOR-CON) CR tablet 40 mEq  40 mEq Oral Q4H Regalado, Belkys A, MD   40 mEq at 02/10/21 0955   traMADol (ULTRAM) tablet 50 mg  50 mg Oral Q6H PRN Regalado, Belkys A, MD   50 mg at 02/10/21 1007   vitamin B-12 (CYANOCOBALAMIN) tablet 500 mcg  500 mcg Oral Daily Regalado, Belkys A, MD   500 mcg at 02/10/21 0955    Review of Systems  Constitutional: positive for anorexia, fatigue, and weight loss Eyes: negative Ears, nose, mouth, throat, and face: negative Respiratory: negative Cardiovascular: negative Gastrointestinal: positive for nausea Genitourinary:negative Integument/breast: negative Hematologic/lymphatic: negative Musculoskeletal:positive for muscle weakness Neurological: positive for paresthesia Behavioral/Psych: negative Endocrine:  negative Allergic/Immunologic: negative  Physical Exam  KMM:NOTRR, healthy, no distress, well nourished, and malnourished SKIN: skin color, texture, turgor are normal HEAD: Normocephalic, No masses, lesions, tenderness or abnormalities EYES: normal, PERRLA, Conjunctiva are pink and non-injected EARS: External ears normal, Canals clear OROPHARYNX:no exudate, no erythema, and lips, buccal mucosa, and tongue normal  NECK: supple, no adenopathy LYMPH:  no palpable lymphadenopathy, no hepatosplenomegaly BREAST:not examined LUNGS: clear to auscultation , and palpation HEART: regular rate & rhythm, no murmurs, and no gallops ABDOMEN:abdomen soft, non-tender, normal bowel sounds, and no masses or organomegaly BACK: No CVA tenderness, Range of motion is normal EXTREMITIES:no joint deformities, effusion, or inflammation, no edema  NEURO: alert & oriented x 3 with fluent speech, no focal motor/sensory deficits  PERFORMANCE STATUS: ECOG 1  LABORATORY DATA: Lab Results  Component Value Date   WBC 6.6 02/10/2021   HGB 8.4 (L) 02/10/2021   HCT 24.7 (L) 02/10/2021   MCV 103.3 (H) 02/10/2021   PLT 38 (L) 02/10/2021    _0 @  RADIOGRAPHIC STUDIES: CT HEAD WO CONTRAST  Result Date: 02/08/2021 CLINICAL DATA:  Acute neuro deficit.  Vision change.  Unsteady gait EXAM: CT HEAD WITHOUT CONTRAST TECHNIQUE: Contiguous axial images were obtained from the base of the skull through the vertex without intravenous contrast. COMPARISON:  None. FINDINGS: Brain: Generalized atrophy. Periventricular white matter hypodensity bilaterally appears symmetric. Negative for acute infarct, hemorrhage, mass. 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 Electronically Signed   By: Franchot Gallo M.D.   On: 02/08/2021 13:34   MR BRAIN WO CONTRAST  Result Date:  02/08/2021 CLINICAL DATA:  Transient ischemic attack. EXAM: MRI HEAD WITHOUT CONTRAST TECHNIQUE: Multiplanar, multiecho pulse sequences of the brain and surrounding structures were obtained without intravenous contrast. COMPARISON:  Same day CT head. FINDINGS: Brain: No acute infarction, hemorrhage, hydrocephalus, extra-axial collection or mass lesion. Moderate atrophy with ventriculomegaly. Cerebellar tonsils are in normal position. Unremarkable sella. Vascular: Major arterial flow voids are maintained at the skull base. Skull and upper cervical spine: Normal marrow signal. Sinuses/Orbits: Mild ethmoid air cell mucosal thickening. No air-fluid levels. Unremarkable orbits. Other: No sizable mastoid effusions. IMPRESSION: 1. No acute infarct. 2. Ventriculomegaly, which may be ex vacuo in the setting of moderate atrophy. Normal pressure hydrocephalus could have a similar appearance in the correct clinical setting. 3. Mild to moderate T2/FLAIR hyperintensities within the white matter. This finding is nonspecific but can be seen in the setting of chronic microvascular ischemia, a demyelinating process such as multiple sclerosis, chronic migraines, or as the sequelae of a prior infectious or inflammatory process. Electronically Signed   By: Margaretha Sheffield MD   On: 02/08/2021 17:34   CT ABDOMEN PELVIS W CONTRAST  Result Date: 02/09/2021 CLINICAL DATA:  Abdominal  pain with nausea vomiting EXAM: CT ABDOMEN AND PELVIS WITH CONTRAST TECHNIQUE: Multidetector CT imaging of the abdomen and pelvis was performed using the standard protocol following bolus administration of intravenous contrast. CONTRAST:  31m OMNIPAQUE IOHEXOL 300 MG/ML  SOLN COMPARISON:  None. FINDINGS: Lower chest: No acute abnormality. Normal size heart. Left anterior descending coronary artery calcifications. No significant pericardial effusion/thickening. Hepatobiliary: Hepatomegaly measuring 18.2 cm in maximum craniocaudal dimension. Diffusely  decreased attenuation of the hepatic parenchyma with geographic regions of hyperattenuation involving the gallbladder fossa anterior left lobe of the liver and posterior right lobe of the liver, which persist on delayed imaging, for instance on image 27/2 and 32/2. Gallbladder is grossly unremarkable. No biliary ductal dilation. Pancreas: Within normal limits. Spleen: Calcified splenic granulomata.  Normal size spleen. Adrenals/Urinary Tract: Adrenal glands are unremarkable. Kidneys are normal, without renal calculi, focal lesion, or hydronephrosis. Bladder is unremarkable. Stomach/Bowel: Mild symmetric thickening of the gastric antrum. Normal positioning of the duodenum/ligament of Treitz. Radiopaque enteric contrast traverses the hepatic flexure. No pathologic dilation of small bowel. Normal appendix. Gas and fluid visualized throughout the colon. Vascular/Lymphatic: Aortic atherosclerosis without aneurysmal dilation. The hepatic, portal and splenic veins appear patent. Prominent periportal lymph nodes measuring up to 5 mm on image 32/2, likely reactive. No pathologically enlarged abdominal or pelvic lymph nodes. Reproductive: Status post hysterectomy. No adnexal masses. Other: No abdominopelvic ascites. Musculoskeletal: Mild multilevel degenerative changes spine. No aggressive lytic or blastic lesion of bone. IMPRESSION: 1. Hepatomegaly with diffuse hepatic hypoattenuation and superimposed geographic regions of hyperattenuation involving the gallbladder fossa, anterior left lobe of the liver and posterior right lobe of the liver, which persist on delayed imaging. Findings which likely reflect hepatic steatosis with focal fatty sparing. 2. Mild symmetric thickening of the gastric antrum, which may represent gastritis. 3. Gas and fluid visualized throughout the colon suggestive of diarrheal state. No evidence of bowel obstruction. 4. Aortic atherosclerosis.  Aortic Atherosclerosis (ICD10-I70.0). Electronically  Signed   By: JDahlia BailiffMD   On: 02/09/2021 13:59    ASSESSMENT: This is a very pleasant 60years old white female admitted with significant nausea and vomiting as well as hypokalemia and was found to have persistent anemia and thrombocytopenia likely secondary to nutritional deficiency as well as history of alcohol abuse but other bone marrow abnormality could not be excluded at this point.   PLAN: I had a lengthy discussion with the patient today about her current condition and further investigation to confirm the underlying etiology of her anemia and thrombocytopenia. The patient had extensive blood work that was unremarkable except for the low serum folate and this is currently being replaced. I recommended for her to have a bone marrow biopsy and aspirate to rule out any underlying bone marrow abnormalities. I also strongly recommend for the patient to quit alcohol drinking. I will arrange for the patient a follow-up appointment after discharge from the hospital to discuss her biopsy results and further recommendation regarding her condition.  She does not need to stay in the hospital for the results if she is a stable regarding her other medical condition. I provided the patient with my contact information and she knows to call after her discharge from the hospital. The patient voices understanding of current disease status and treatment options and is in agreement with the current care plan.  All questions were answered. The patient knows to call the clinic with any problems, questions or concerns. We can certainly see the patient much sooner if necessary.  Thank you so much for allowing me to participate in the care of [name] [name]. I will continue to follow up the patient with you and assist in her care.   Disclaimer: This note was dictated with voice recognition software. Similar sounding words can inadvertently be transcribed and may not be corrected upon review.   Eilleen Kempf February 10, 2021, 12:48 PM

## 2021-02-10 NOTE — Evaluation (Addendum)
Physical Therapy Evaluation Patient Details Name: [name] [name] MRN: [id number] DOB: [birth year] Today's Date: 02/10/2021   History of Present Illness  60 year old with no significant past medical history who presents complaining of nausea vomiting and vision changes and weakness.  She reports intermittent nausea and vomiting for the last 3 months.  Reports intermittent blurry vision. She reports worsening numbness and tingling in her hands and feet. She feels weak all over.  Pt found to be hypokalemic and with UTI. MRI revealed: 1. No acute infarct.  2. Ventriculomegaly, which may be ex vacuo in the setting of  moderate atrophy. Normal pressure hydrocephalus could have a similar  appearance in the correct clinical setting.  3. Mild to moderate T2/FLAIR hyperintensities within the white  matter. This finding is nonspecific but can be seen in the setting  of chronic microvascular ischemia, a demyelinating process such as multiple sclerosis, chronic migraines, or as the sequelae of a prior  infectious or inflammatory process.  Clinical Impression  Pt admitted with above diagnosis. Pt independent at baseline without AD use. Pt currently requiring min A with transfer and limited ambulation using RW. Pt with ataxic-like steps, appears to hesitate steps as though she can't see where she is going, reports "heavy cement" feeling in legs and quickly fatiguing requiring seated rest break compared to baseline activity tolerance. Pt with mildly impaired heel shin test bilaterally, strength is symmetrical with MMT, though pt reports RLE "feels weaker". Recommending CIR- if pt unwilling, pt may need 24 hr assist at home while regaining strength and independence. Pt currently with functional limitations due to the deficits listed below (see PT Problem List). Pt will benefit from skilled PT to increase their independence and safety with mobility to allow discharge to the venue listed below.       Follow Up  Recommendations CIR    Equipment Recommendations  None recommended by PT    Recommendations for Other Services       Precautions / Restrictions Precautions Precautions: Fall Precaution Comments: blurred vision Restrictions Weight Bearing Restrictions: No      Mobility  Bed Mobility Overal bed mobility: Modified Independent       Transfers Overall transfer level: Needs assistance Equipment used: Rolling walker (2 wheeled);None Transfers: Sit to/from Stand Sit to Stand: Min guard;Min assist General transfer comment: min guard to steady with power to stand when using RW, min A to steady without assistance, slightly unsteady with BLE braced against bed for all transfers  Ambulation/Gait Ambulation/Gait assistance: Min assist Gait Distance (Feet): 30 Feet Assistive device: Rolling walker (2 wheeled) Gait Pattern/deviations: Step-to pattern;Decreased stride length;Narrow base of support Gait velocity: decreased   General Gait Details: ataxic-like step progression with narrow BOS, no LOB but generally unsteady, pt reports blurred vision hindering ambulation and pt does appear hesitant with ambulation as though she can't see where she is going, some assist to maneuver RW safely  Stairs            Wheelchair Mobility    Modified Rankin (Stroke Patients Only)       Balance Overall balance assessment: History of Falls;Needs assistance (No falls until day of admission, then 2) Sitting-balance support: No upper extremity supported;Feet supported Sitting balance-Leahy Scale: Good Sitting balance - Comments: seated EOB   Standing balance support: During functional activity Standing balance-Leahy Scale: Poor Standing balance comment: reliant on UE support        Pertinent Vitals/Pain Pain Assessment: No/denies pain    Home Living Family/patient expects to  be discharged to:: Private residence Living Arrangements: Alone Available Help at Discharge: Available 24  hours/day;Family (Sister coming in from [place] area "for a couple of days". Then a friend will be available PRN.) Type of Home: House Home Access: Stairs to enter Entrance Stairs-Rails: None Entrance Stairs-Number of Steps: 2 Home Layout: One level Home Equipment: Grab bars - tub/shower;Bedside commode;Walker - 2 wheels;Shower seat;Hand held shower head      Prior Function Level of Independence: Independent         Comments: Pt drives, Independent with all ADLs and IADLs, ambulates around neighborhood daily for exercise, no AD use. Pt is retired Runner, broadcasting/film/video.     Hand Dominance   Dominant Hand: Right    Extremity/Trunk Assessment   Upper Extremity Assessment Upper Extremity Assessment: Defer to OT evaluation RUE Sensation: decreased light touch (D2 PIP proprioception intact) LUE Sensation: decreased light touch (D2 PIP proprioception intact)    Lower Extremity Assessment Lower Extremity Assessment: RLE deficits/detail;LLE deficits/detail (symmetrical BLE, reports more difficulty and "Feels weaker" on RLE, initial MMT with LEs swinging and lacking motor control but improves some with VCs) RLE Deficits / Details: AROM WNL, strength 4+/5 throughout, heel to shin mild deficit RLE Sensation: decreased light touch LLE Deficits / Details: AROM WNL, strength 4+/5 throughout, heel to shin mild deficit LLE Sensation: decreased light touch    Cervical / Trunk Assessment Cervical / Trunk Assessment: Normal  Communication   Communication: No difficulties  Cognition Arousal/Alertness: Awake/alert Behavior During Therapy: WFL for tasks assessed/performed;Flat affect Overall Cognitive Status: Within Functional Limits for tasks assessed  General Comments: A&Ox4      General Comments      Exercises     Assessment/Plan    PT Assessment Patient needs continued PT services  PT Problem List Decreased activity tolerance;Decreased balance;Decreased coordination       PT Treatment  Interventions DME instruction;Gait training;Functional mobility training;Therapeutic activities;Therapeutic exercise;Balance training;Neuromuscular re-education;Patient/family education    PT Goals (Current goals can be found in the Care Plan section)  Acute Rehab PT Goals Patient Stated Goal: regain independence PT Goal Formulation: With patient Time For Goal Achievement: 02/24/21 Potential to Achieve Goals: Good    Frequency Min 3X/week   Barriers to discharge        Co-evaluation               AM-PAC PT "6 Clicks" Mobility  Outcome Measure Help needed turning from your back to your side while in a flat bed without using bedrails?: None Help needed moving from lying on your back to sitting on the side of a flat bed without using bedrails?: None Help needed moving to and from a bed to a chair (including a wheelchair)?: A Little Help needed standing up from a chair using your arms (e.g., wheelchair or bedside chair)?: A Little Help needed to walk in hospital room?: A Little Help needed climbing 3-5 steps with a railing? : A Little 6 Click Score: 20    End of Session Equipment Utilized During Treatment: Gait belt Activity Tolerance: Patient tolerated treatment well Patient left: in chair;with call bell/phone within reach;with chair alarm set Nurse Communication: Mobility status PT Visit Diagnosis: Unsteadiness on feet (R26.81);Other abnormalities of gait and mobility (R26.89);Difficulty in walking, not elsewhere classified (R26.2)    Time: 2440-1027 PT Time Calculation (min) (ACUTE ONLY): 22 min   Charges:   PT Evaluation $PT Eval Moderate Complexity: 1 Mod           Tori Remijio Holleran PT, DPT  02/10/21, 3:09 PM

## 2021-02-10 NOTE — Progress Notes (Signed)
PROGRESS NOTE    [name] [name]  NLG:[id number] DOB: May 14, 1961 DOA: 02/08/2021 PCP: Pcp, No   Brief Narrative: 60 year old with no significant past medical history who presents complaining of nausea vomiting and vision changes and weakness.  She reports intermittent nausea and vomiting for the last 3 months.  Reports intermittent blurry vision.  She denies headache.  She reports worsening numbness and tingling in her hands and feet.  She feels weak all over.  Evaluation in the ED: Patient was found to have a potassium of 2.4.  UA consistent with UTI.     Assessment & Plan:   Active Problems:   Hypokalemia  1-Hypokalemia, Hyponatremia, Hypocalcemia: -Patient received IV potassium and oral potassium. -K increase to 3.5.  Continue with oral potassium.  Received calcium supplement. Start oral calcium Improved.   2-Anemia; Thrombocytopenia;  Folic acid low. Replete IV.  Thrombocytopenia could de related to infection, HO alcohol use.  Peripheral smear. Macrocytic anemia, thrombocytopenia.  Platelet further decreased today. Hematology consulted.  Dr Julien Nordmann recommend Bone marrow biopsy, patient does not wants to proceed with further testing in patient.   3-Epigastric pain; Diarrhea CT abdomen ; possible gastritis,  Started PPI.  Improve.  Might need GI follow up. Endoscopy/.  Tramadol PRN.  C diff negative.   4-UTI; Continue with ceftriaxone.  Follow urine culture. Growing E coli.   5-Neuropathy;  Numbness and tingling stable.  Start Y81 and folic acid.  MRI negative for stroke. Discussed with neurology MRI finding are non specific, appears more related to chronic vascular diseases than MS. Needs follow up with neurologist out patient.   Mild transaminases; CT hepatic steatosis. Hepatomegaly.  HTN; BP better, without medication.   Estimated body mass index is 20.78 kg/m as calculated from the following:   Height as of this encounter: _0  (1.549 m).   Weight as of  this encounter: 49.9 kg.   DVT prophylaxis: SCD Code Status: Partial Code; no intubation  Family Communication: Care discussed with patient.  Disposition Plan:  Status is: Inpatient  Remains inpatient appropriate because:IV treatments appropriate due to intensity of illness or inability to take PO  Dispo: The patient is from:               Anticipated d/c is to: Home              Patient currently is not medically stable to d/c.   Difficult to place patient No        Consultants:  Neurology phone   Procedures:  None  Antimicrobials:    Subjective: She is feeling better, tolerating diet. She would like to go home. Does not wants futrher procedure.  Diarrhea improved.   Objective: Vitals:   02/09/21 1309 02/09/21 1701 02/09/21 1705 02/10/21 0621  BP: (!) 153/100 109/81 103/78 (!) 137/94  Pulse: 90   83  Resp: _1 Temp: 97.6 F (36.4 C)   97.8 F (36.6 C)  TempSrc: Oral   Oral  SpO2: 100%   100%  Weight:      Height:        Intake/Output Summary (Last 24 hours) at 02/10/2021 0945 Last data filed at 02/10/2021 0600 Gross per 24 hour  Intake 1287.25 ml  Output --  Net 1287.25 ml    Filed Weights   02/08/21 1210  Weight: 49.9 kg    Examination:  General exam: NAD Respiratory system: CTA Cardiovascular system: S 1, S 2 RRR Gastrointestinal system: BS present, soft, nt  Central nervous system: non focal.  Extremities: Symmetric power   Data Reviewed: I have personally reviewed following labs and imaging studies  CBC: Recent Labs  Lab 02/08/21 1253 02/09/21 0334 02/10/21 0343  WBC 11.9* 7.8 6.6  NEUTROABS 9.7*  --   --   HGB 10.1* 8.5* 8.4*  HCT 28.5* 24.8* 24.7*  MCV 102.2* 103.3* 103.3*  PLT 53* 42* 38*    Basic Metabolic Panel: Recent Labs  Lab 02/08/21 1253 02/08/21 2150 02/09/21 0334 02/09/21 1903 02/10/21 0809  NA 134*  --  135 133* 136  K 2.4*  --  3.5 3.1* 3.3*  CL 96*  --  101 99 103  CO2 24  --  22 20* 23   GLUCOSE 136*  --  101* 120* 112*  BUN 7  --  <5* <5* <5*  CREATININE 0.67  --  0.42* 0.56 0.45  CALCIUM 8.2*  --  8.0* 7.9* 8.1*  MG 1.7 2.1  --   --   --     GFR: Estimated Creatinine Clearance: 57.1 mL/min (by C-G formula based on SCr of 0.45 mg/dL). Liver Function Tests: Recent Labs  Lab 02/08/21 1253 02/08/21 2150 02/09/21 0334  AST 62*  --  50*  ALT 34  --  28  ALKPHOS 79  --  69  BILITOT 1.4* 1.1 0.8  PROT 7.2  --  6.2*  ALBUMIN 3.7  --  3.0*    Recent Labs  Lab 02/08/21 1253  LIPASE 51    No results for input(s): AMMONIA in the last 168 hours. Coagulation Profile: No results for input(s): INR, PROTIME in the last 168 hours. Cardiac Enzymes: No results for input(s): CKTOTAL, CKMB, CKMBINDEX, TROPONINI in the last 168 hours. BNP (last 3 results) No results for input(s): PROBNP in the last 8760 hours. HbA1C: No results for input(s): HGBA1C in the last 72 hours. CBG: No results for input(s): GLUCAP in the last 168 hours. Lipid Profile: No results for input(s): CHOL, HDL, LDLCALC, TRIG, CHOLHDL, LDLDIRECT in the last 72 hours. Thyroid Function Tests: Recent Labs    02/08/21 1438  TSH 1.081    Anemia Panel: Recent Labs    02/08/21 1602 02/08/21 1603  VITAMINB12 374  --   FOLATE  --  1.8*  TIBC 156*  --   IRON 139  --     Sepsis Labs: No results for input(s): PROCALCITON, LATICACIDVEN in the last 168 hours.  Recent Results (from the past 240 hour(s))  Urine culture     Status: Abnormal   Collection Time: 02/08/21 12:41 PM   Specimen: Urine, Random  Result Value Ref Range Status   Specimen Description   Final    URINE, RANDOM Performed at [facility] [address] [place], [place] [address]    Special Requests   Final    NONE Performed at [facility], [place] [address] [place], [place] [address]    Culture >=100,000 COLONIES/mL ESCHERICHIA COLI (A)  Final   Report Status 02/10/2021 FINAL  Final    Organism ID, Bacteria ESCHERICHIA COLI (A)  Final      Susceptibility   Escherichia coli - MIC*    AMPICILLIN >=32 RESISTANT Resistant     CEFAZOLIN <=4 SENSITIVE Sensitive     CEFEPIME <=0.12 SENSITIVE Sensitive     CEFTRIAXONE <=0.25 SENSITIVE Sensitive     CIPROFLOXACIN <=0.25 SENSITIVE Sensitive     GENTAMICIN <=1 SENSITIVE Sensitive     IMIPENEM <=0.25 SENSITIVE Sensitive  NITROFURANTOIN <=16 SENSITIVE Sensitive     TRIMETH/SULFA <=20 SENSITIVE Sensitive     AMPICILLIN/SULBACTAM 4 SENSITIVE Sensitive     PIP/TAZO <=4 SENSITIVE Sensitive     * >=100,000 COLONIES/mL ESCHERICHIA COLI  Resp Panel by RT-PCR (Flu A&B, Covid) Nasopharyngeal Swab     Status: None   Collection Time: 02/08/21 12:53 PM   Specimen: Nasopharyngeal Swab; Nasopharyngeal(NP) swabs in vial transport medium  Result Value Ref Range Status   SARS Coronavirus 2 by RT PCR NEGATIVE NEGATIVE Final    Comment: (NOTE) SARS-CoV-2 target nucleic acids are NOT DETECTED.  The SARS-CoV-2 RNA is generally detectable in upper respiratory specimens during the acute phase of infection. The lowest concentration of SARS-CoV-2 viral copies this assay can detect is 138 copies/mL. A negative result does not preclude SARS-Cov-2 infection and should not be used as the sole basis for treatment or other patient management decisions. A negative result may occur with  improper specimen collection/handling, submission of specimen other than nasopharyngeal swab, presence of viral mutation(s) within the areas targeted by this assay, and inadequate number of viral copies(<138 copies/mL). A negative result must be combined with clinical observations, patient history, and epidemiological information. The expected result is Negative.  Fact Sheet for Patients:  EntrepreneurPulse.com.au  Fact Sheet for Healthcare Providers:  IncredibleEmployment.be  This test is no t yet approved or cleared by the  [place] FDA and  has been authorized for detection and/or diagnosis of SARS-CoV-2 by FDA under an Emergency Use Authorization (EUA). This EUA will remain  in effect (meaning this test can be used) for the duration of the COVID-19 declaration under Section 564(b)(1) of the Act, 21 U.S.C.section 360bbb-3(b)(1), unless the authorization is terminated  or revoked sooner.       Influenza A by PCR NEGATIVE NEGATIVE Final   Influenza B by PCR NEGATIVE NEGATIVE Final    Comment: (NOTE) The Xpert Xpress SARS-CoV-2/FLU/RSV plus assay is intended as an aid in the diagnosis of influenza from Nasopharyngeal swab specimens and should not be used as a sole basis for treatment. Nasal washings and aspirates are unacceptable for Xpert Xpress SARS-CoV-2/FLU/RSV testing.  Fact Sheet for Patients: EntrepreneurPulse.com.au  Fact Sheet for Healthcare Providers: IncredibleEmployment.be  This test is not yet approved or cleared by the [place] FDA and has been authorized for detection and/or diagnosis of SARS-CoV-2 by FDA under an Emergency Use Authorization (EUA). This EUA will remain in effect (meaning this test can be used) for the duration of the COVID-19 declaration under Section 564(b)(1) of the Act, 21 U.S.C. section 360bbb-3(b)(1), unless the authorization is terminated or revoked.  Performed at [facility], [place] [address] [place], [place] [address]   C Difficile Quick Screen w PCR reflex     Status: None   Collection Time: 02/09/21 11:10 PM   Specimen: STOOL  Result Value Ref Range Status   C Diff antigen NEGATIVE NEGATIVE Final   C Diff toxin NEGATIVE NEGATIVE Final   C Diff interpretation No C. difficile detected.  Final    Comment: Performed at [facility], [place] [address] [place], [place] [address]          Radiology Studies: CT HEAD WO CONTRAST  Result Date: 02/08/2021 CLINICAL DATA:   Acute neuro deficit.  Vision change.  Unsteady gait EXAM: CT HEAD WITHOUT CONTRAST TECHNIQUE: Contiguous axial images were obtained from the base of the skull through the vertex without intravenous contrast. COMPARISON:  None. FINDINGS: Brain: Generalized atrophy. Periventricular  white matter hypodensity bilaterally appears symmetric. Negative for acute infarct, hemorrhage, mass. 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 Electronically Signed   By: Franchot Gallo M.D.   On: 02/08/2021 13:34   MR BRAIN WO CONTRAST  Result Date: 02/08/2021 CLINICAL DATA:  Transient ischemic attack. EXAM: MRI HEAD WITHOUT CONTRAST TECHNIQUE: Multiplanar, multiecho pulse sequences of the brain and surrounding structures were obtained without intravenous contrast. COMPARISON:  Same day CT head. FINDINGS: Brain: No acute infarction, hemorrhage, hydrocephalus, extra-axial collection or mass lesion. Moderate atrophy with ventriculomegaly. Cerebellar tonsils are in normal position. Unremarkable sella. Vascular: Major arterial flow voids are maintained at the skull base. Skull and upper cervical spine: Normal marrow signal. Sinuses/Orbits: Mild ethmoid air cell mucosal thickening. No air-fluid levels. Unremarkable orbits. Other: No sizable mastoid effusions. IMPRESSION: 1. No acute infarct. 2. Ventriculomegaly, which may be ex vacuo in the setting of moderate atrophy. Normal pressure hydrocephalus could have a similar appearance in the correct clinical setting. 3. Mild to moderate T2/FLAIR hyperintensities within the white matter. This finding is nonspecific but can be seen in the setting of chronic microvascular ischemia, a demyelinating process such as multiple sclerosis, chronic migraines, or as the sequelae of a prior infectious or inflammatory process. Electronically Signed   By:  Margaretha Sheffield MD   On: 02/08/2021 17:34   CT ABDOMEN PELVIS W CONTRAST  Result Date: 02/09/2021 CLINICAL DATA:  Abdominal pain with nausea vomiting EXAM: CT ABDOMEN AND PELVIS WITH CONTRAST TECHNIQUE: Multidetector CT imaging of the abdomen and pelvis was performed using the standard protocol following bolus administration of intravenous contrast. CONTRAST:  58mL OMNIPAQUE IOHEXOL 300 MG/ML  SOLN COMPARISON:  None. FINDINGS: Lower chest: No acute abnormality. Normal size heart. Left anterior descending coronary artery calcifications. No significant pericardial effusion/thickening. Hepatobiliary: Hepatomegaly measuring 18.2 cm in maximum craniocaudal dimension. Diffusely decreased attenuation of the hepatic parenchyma with geographic regions of hyperattenuation involving the gallbladder fossa anterior left lobe of the liver and posterior right lobe of the liver, which persist on delayed imaging, for instance on image 27/2 and 32/2. Gallbladder is grossly unremarkable. No biliary ductal dilation. Pancreas: Within normal limits. Spleen: Calcified splenic granulomata.  Normal size spleen. Adrenals/Urinary Tract: Adrenal glands are unremarkable. Kidneys are normal, without renal calculi, focal lesion, or hydronephrosis. Bladder is unremarkable. Stomach/Bowel: Mild symmetric thickening of the gastric antrum. Normal positioning of the duodenum/ligament of Treitz. Radiopaque enteric contrast traverses the hepatic flexure. No pathologic dilation of small bowel. Normal appendix. Gas and fluid visualized throughout the colon. Vascular/Lymphatic: Aortic atherosclerosis without aneurysmal dilation. The hepatic, portal and splenic veins appear patent. Prominent periportal lymph nodes measuring up to 5 mm on image 32/2, likely reactive. No pathologically enlarged abdominal or pelvic lymph nodes. Reproductive: Status post hysterectomy. No adnexal masses. Other: No abdominopelvic ascites. Musculoskeletal: Mild multilevel  degenerative changes spine. No aggressive lytic or blastic lesion of bone. IMPRESSION: 1. Hepatomegaly with diffuse hepatic hypoattenuation and superimposed geographic regions of hyperattenuation involving the gallbladder fossa, anterior left lobe of the liver and posterior right lobe of the liver, which persist on delayed imaging. Findings which likely reflect hepatic steatosis with focal fatty sparing. 2. Mild symmetric thickening of the gastric antrum, which may represent gastritis. 3. Gas and fluid visualized throughout the colon suggestive of diarrheal state. No evidence of bowel obstruction. 4. Aortic atherosclerosis.  Aortic Atherosclerosis (ICD10-I70.0). Electronically Signed   By: Dahlia Bailiff MD  On: 02/09/2021 13:59        Scheduled Meds:  folic acid  1 mg Oral Daily   pantoprazole  40 mg Oral BID   potassium chloride SA  40 mEq Oral Q4H   vitamin B-12  500 mcg Oral Daily   Continuous Infusions:  cefTRIAXone (ROCEPHIN)  IV 1 g (02/09/21 1527)     LOS: 2 days    Time spent: 35 minutes.     Elmarie Shiley, MD [facility]   If 7PM-7AM, please contact night-coverage [email or web address]  02/10/2021, 9:45 AM

## 2021-02-10 NOTE — Progress Notes (Signed)
Inpatient Rehab Admissions Coordinator Note:   Per therapy recommendations, pt was screened for CIR candidacy by Megan Salon, MS CCC-SLP. At this time, Pt. Does not demonstrate medical necessity for CIR.Marland Kitchen   Please contact me with questions.   Megan Salon, MS, CCC-SLP Rehab Admissions Coordinator  [phone] (celll) [phone] (office)

## 2021-02-10 NOTE — Plan of Care (Signed)
  Problem: Education: Goal: Knowledge of General Education information will improve Description: Including pain rating scale, medication(s)/side effects and non-pharmacologic comfort measures Outcome: Progressing   Problem: Clinical Measurements: Goal: Will remain free from infection Outcome: Progressing Goal: Respiratory complications will improve Outcome: Progressing   

## 2021-02-11 ENCOUNTER — Telehealth: Payer: Self-pay | Admitting: Internal Medicine

## 2021-02-11 ENCOUNTER — Inpatient Hospital Stay (HOSPITAL_COMMUNITY): Payer: BC Managed Care – PPO

## 2021-02-11 LAB — BASIC METABOLIC PANEL
Anion gap: 5 (ref 5–15)
BUN: <5 mg/dL — ABNORMAL LOW (ref 6–20)
CO2: 26 mmol/L (ref 22–32)
Calcium: 8.1 mg/dL — ABNORMAL LOW (ref 8.9–10.3)
Chloride: 103 mmol/L (ref 98–111)
Creatinine, Ser: 0.49 mg/dL (ref 0.44–1.00)
GFR, Estimated: >60 mL/min (ref >60)
Glucose, Bld: 115 mg/dL — ABNORMAL HIGH (ref 70–99)
Potassium: 4 mmol/L (ref 3.5–5.1)
Sodium: 134 mmol/L — ABNORMAL LOW (ref 135–145)

## 2021-02-11 LAB — CBC
HCT: 24.2 % — ABNORMAL LOW (ref 36.0–46.0)
Hemoglobin: 8.3 g/dL — ABNORMAL LOW (ref 12.0–15.0)
MCH: 35.9 pg — ABNORMAL HIGH (ref 26.0–34.0)
MCHC: 34.3 g/dL (ref 30.0–36.0)
MCV: 104.8 fL — ABNORMAL HIGH (ref 80.0–100.0)
Platelets: 33 10*3/uL — ABNORMAL LOW (ref 150–400)
RBC: 2.31 MIL/uL — ABNORMAL LOW (ref 3.87–5.11)
RDW: 16 % — ABNORMAL HIGH (ref 11.5–15.5)
WBC: 5.4 10*3/uL (ref 4.0–10.5)
nRBC: 0 % (ref 0.0–0.2)

## 2021-02-11 IMAGING — CT CT BIOPSY
1 of 2 series · 15 of 28 positions shown, 19 images · non-contrast
Comparison: none

INDICATION: Anemia, thrombocytopenia

[Series 2: i-spiral 5.0 br40 · axial · 0.98mm/px · z∈[+1140,+1217]mm · 15 of 25 slices shown, 19 images]
[im 2/25  mediastinal]
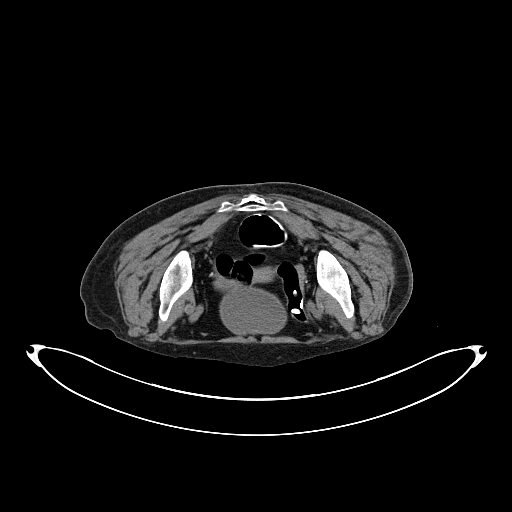
[im 2/25  lung]
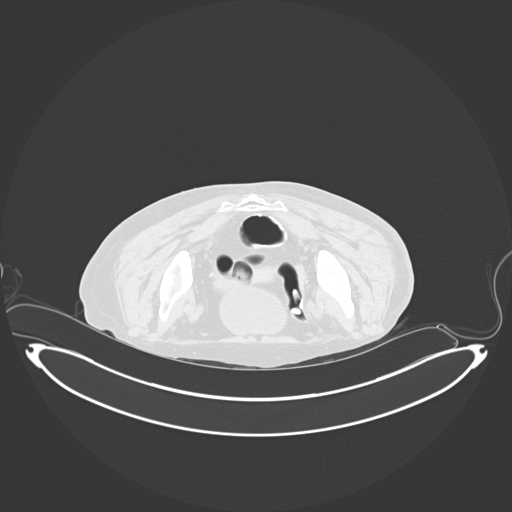
[im 4/25  lung]
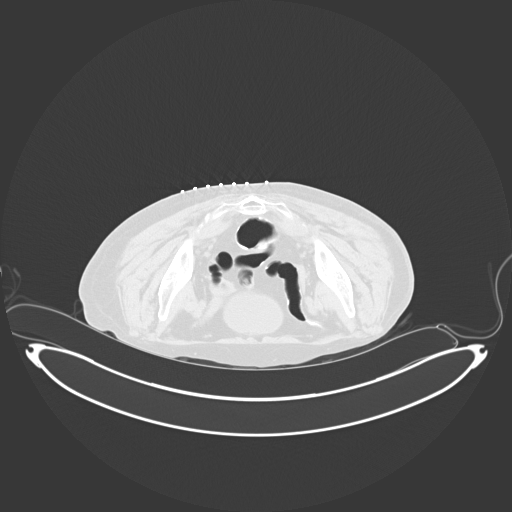
[im 5/25  lung]
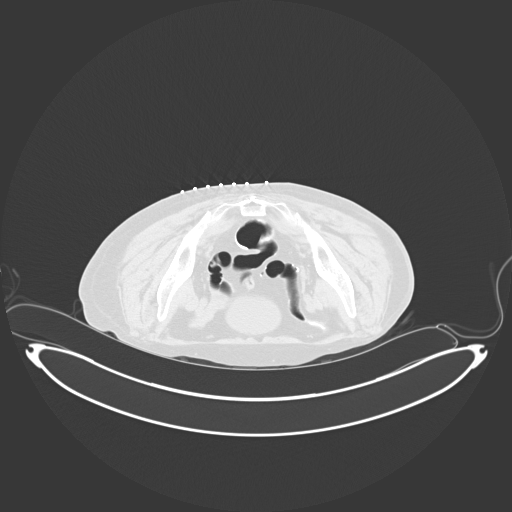
[im 7/25  lung]
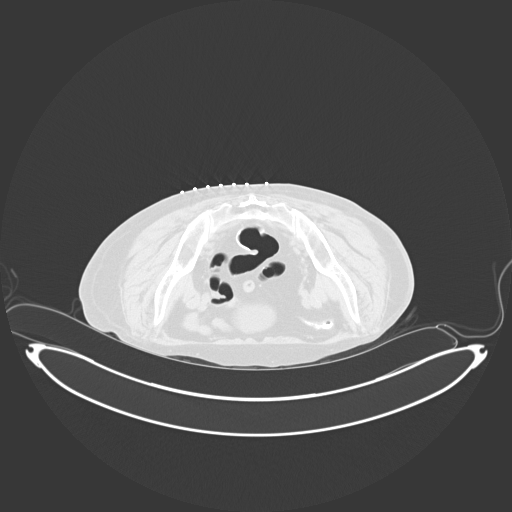
[im 8/25  mediastinal]
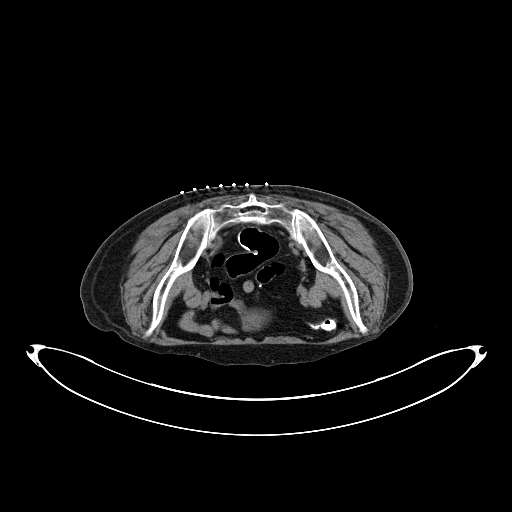
[im 8/25  lung]
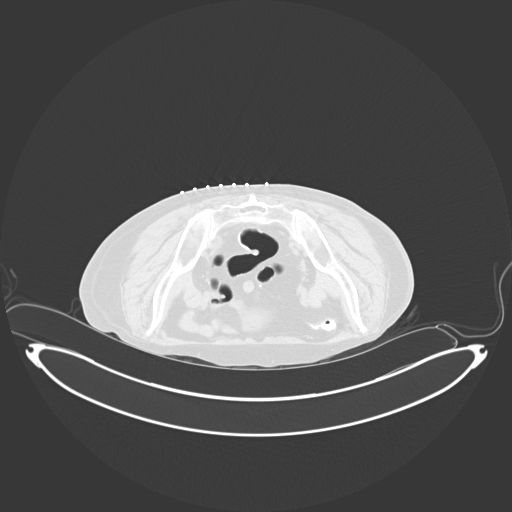
[im 10/25  lung]
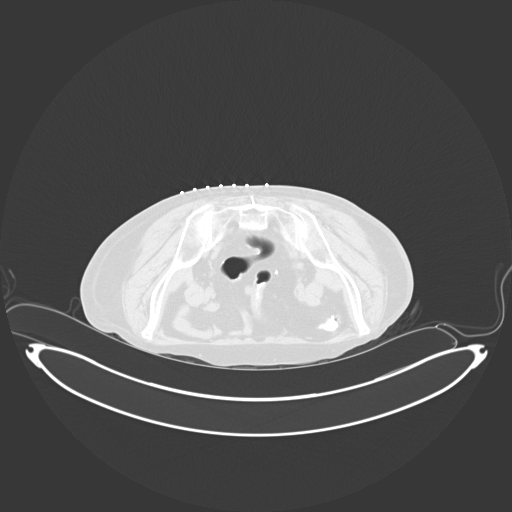
[im 11/25  lung]
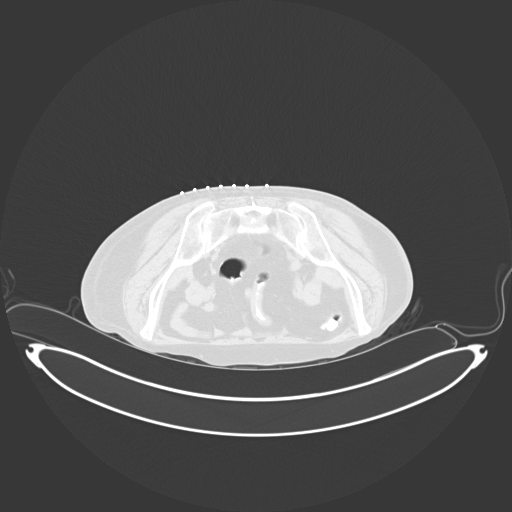
[im 13/25  lung]
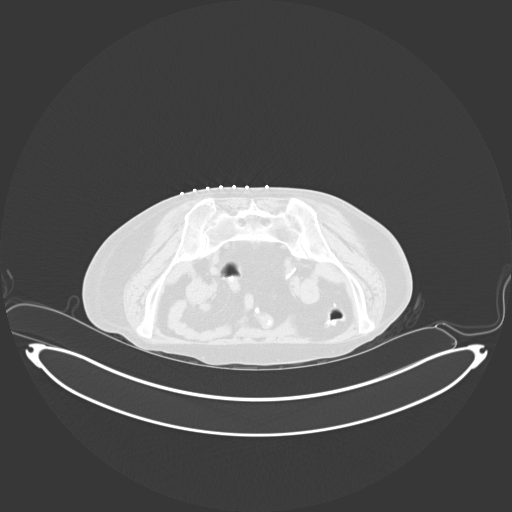
[im 15/25  mediastinal]
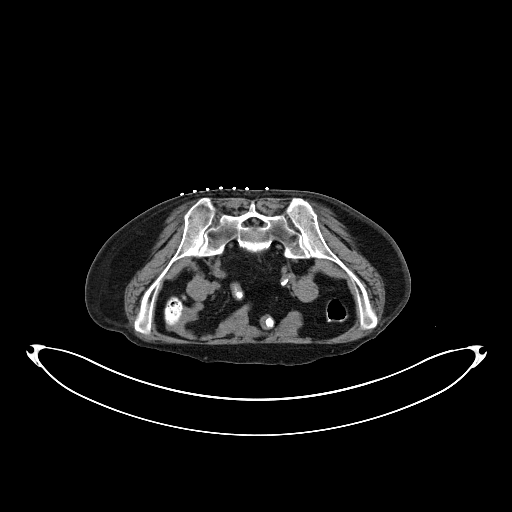
[im 15/25  lung]
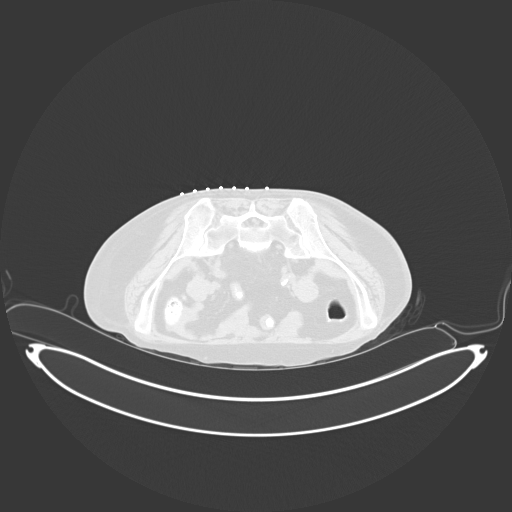
[im 16/25  lung]
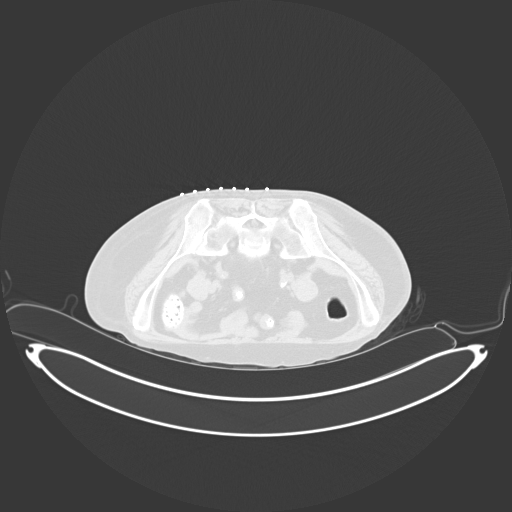
[im 18/25  lung]
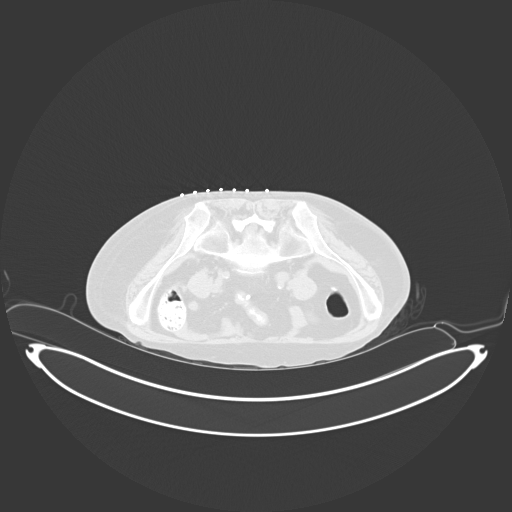
[im 19/25  lung]
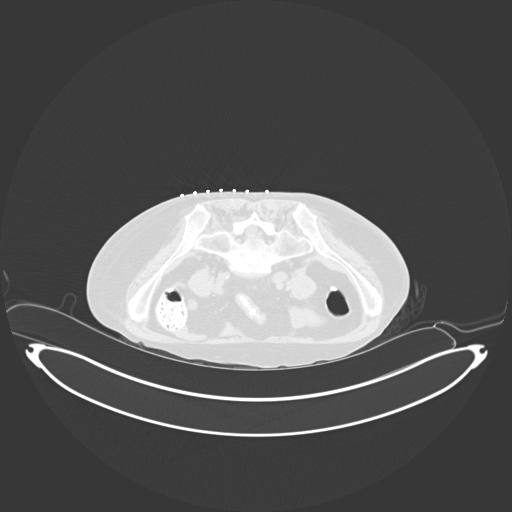
[im 21/25  mediastinal]
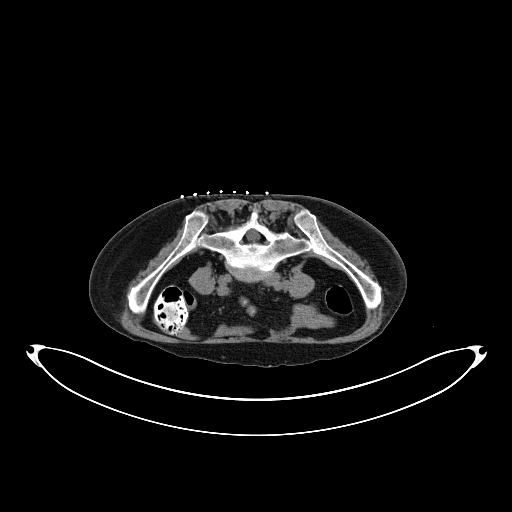
[im 21/25  lung]
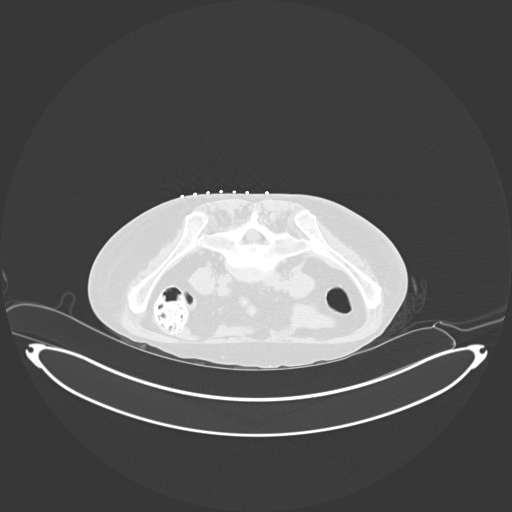
[im 22/25  lung]
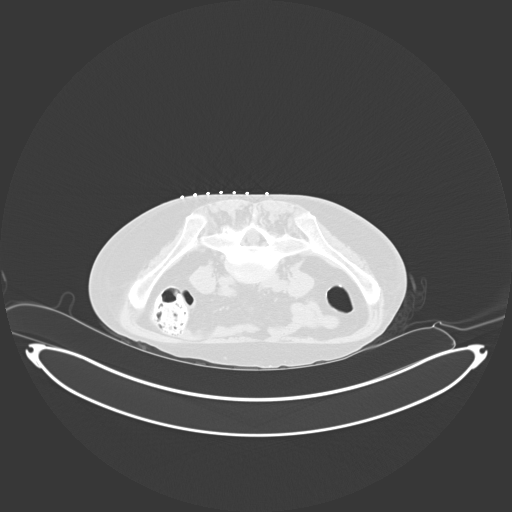
[im 24/25  lung]
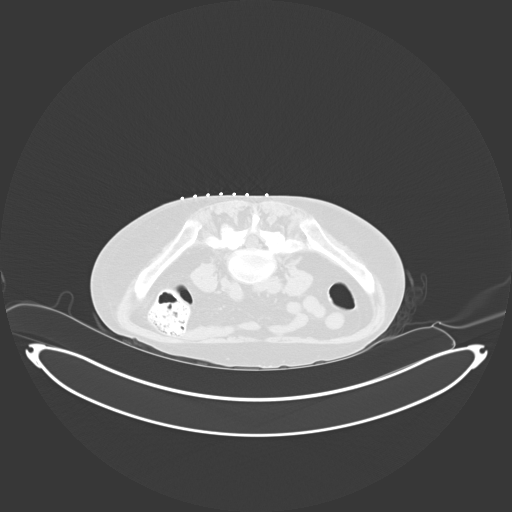

[15 of 28 positions shown; findings below may reference images not displayed]

EXAM:
CT GUIDED RIGHT ILIAC BONE MARROW ASPIRATION AND CORE BIOPSY

Radiologist:  AH SING

Guidance:  CT

FLUOROSCOPY TIME:  Fluoroscopy Time: None.

MEDICATIONS:
1% lidocaine local

ANESTHESIA/SEDATION:
2.0 mg IV Versed; 100 mcg IV Fentanyl

Moderate Sedation Time:  10 minutes

The patient was continuously monitored during the procedure by the
interventional radiology nurse under my direct supervision.

CONTRAST:  None.

COMPLICATIONS:
None

PROCEDURE:
Informed consent was obtained from the patient following explanation
of the procedure, risks, benefits and alternatives. The patient
understands, agrees and consents for the procedure. All questions
were addressed. A time out was performed.

The patient was positioned prone and non-contrast localization CT
was performed of the pelvis to demonstrate the iliac marrow spaces.

Maximal barrier sterile technique utilized including caps, mask,
sterile gowns, sterile gloves, large sterile drape, hand hygiene,
and Betadine prep.

Under sterile conditions and local anesthesia, an 11 gauge coaxial
bone biopsy needle was advanced into the right iliac marrow space.
Needle position was confirmed with CT imaging. Initially, bone
marrow aspiration was performed. Next, the 11 gauge outer cannula
was utilized to obtain a right iliac bone marrow core biopsy. Needle
was removed. Hemostasis was obtained with compression. The patient
tolerated the procedure well. Samples were prepared with the
cytotechnologist. No immediate complications.
IMPRESSION: CT guided right iliac bone marrow aspiration and core biopsy.

## 2021-02-11 MED ORDER — FENTANYL CITRATE (PF) 100 MCG/2ML IJ SOLN
INTRAMUSCULAR | Status: AC
  Filled 2021-02-11: qty 2

## 2021-02-11 MED ORDER — MIDAZOLAM HCL 2 MG/2ML IJ SOLN
INTRAMUSCULAR | Status: AC | PRN
  Administered 2021-02-11 (×2): 1 mg via INTRAVENOUS

## 2021-02-11 MED ORDER — LIDOCAINE HCL (PF) 1 % IJ SOLN
INTRAMUSCULAR | Status: AC | PRN
  Administered 2021-02-11: 10 mL

## 2021-02-11 MED ORDER — FENTANYL CITRATE (PF) 100 MCG/2ML IJ SOLN
INTRAMUSCULAR | Status: AC | PRN
  Administered 2021-02-11 (×2): 50 ug via INTRAVENOUS

## 2021-02-11 MED ORDER — MIDAZOLAM HCL 2 MG/2ML IJ SOLN
INTRAMUSCULAR | Status: AC
  Filled 2021-02-11: qty 4

## 2021-02-11 NOTE — Progress Notes (Signed)
Brief hematology note:  I attempted to visit with the patient today, but she was down having her bone marrow biopsy.  Please see consult note from Dr. Julien Nordmann from 6/15 for recommendations regarding management of her anemia and thrombocytopenia.  I have sent a scheduling message to the [facility] to arrange outpatient follow-up next week.  From our standpoint, the patient may be discharged if she is stable regarding her other medical conditions.  She does not need to stay in the hospital to await the results of the bone marrow biopsy.  Mikey Bussing, DNP, AGPCNP-BC, AOCNP

## 2021-02-11 NOTE — Progress Notes (Signed)
PROGRESS NOTE    [name] [name]  STM:[id number] DOB: 1960/11/22 DOA: 02/08/2021 PCP: Pcp, No   Brief Narrative: 60 year old with no significant past medical history who presents complaining of nausea vomiting and vision changes and weakness.  She reports intermittent nausea and vomiting for the last 3 months.  Reports intermittent blurry vision.  She denies headache.  She reports worsening numbness and tingling in her hands and feet.  She feels weak all over.  Evaluation in the ED: Patient was found to have a potassium of 2.4.  UA consistent with UTI.     Assessment & Plan:   Active Problems:   Hypokalemia   Lower urinary tract infectious disease  1-Hypokalemia, Hyponatremia, Hypocalcemia: -Patient received IV potassium and oral potassium. -K increase to 3.5.  Continue with oral potassium.  Received calcium supplement. Start oral calcium Improved.   2-Anemia; Thrombocytopenia;  Folic acid low. Replete IV.  Thrombocytopenia could de related to infection, HO alcohol use.  Peripheral smear. Macrocytic anemia, thrombocytopenia.  Platelet further decreased today. Hematology consulted.  Dr Julien Nordmann recommend Bone marrow biopsy, patient agreed to proceed with Bone marrow biopsy, underwent this morning 6/16. Bone marrow biopsy pending.   3-Epigastric pain; Diarrhea CT abdomen ; possible gastritis,  Started PPI.  Improve.  Might need GI follow up. Endoscopy/.  Tramadol PRN.  C diff negative.  Vomited once yesterday. Monitor   4-UTI; Continue with ceftriaxone. Plan for 5 days treatment.  Follow urine culture. Growing E coli.   5-Neuropathy;  Numbness and tingling stable.  Started G92 and folic acid.  MRI negative for stroke. Discussed with neurology MRI finding are non specific, appears more related to chronic vascular diseases than MS. Needs follow up with neurologist out patient.   Mild transaminases; CT hepatic steatosis. Hepatomegaly.  HTN; BP better, without  medication.  Fall, denies hitting head. Has small hematoma second finger right hands. Plan to apply ice, keep elevated. Decline x ray at this time.  Estimated body mass index is 20.78 kg/m as calculated from the following:   Height as of this encounter: 5' 1"  (1.549 m).   Weight as of this encounter: 49.9 kg.   DVT prophylaxis: SCD Code Status: Partial Code; no intubation  Family Communication: Care discussed with patient.  Disposition Plan:  Status is: Inpatient  Remains inpatient appropriate because:IV treatments appropriate due to intensity of illness or inability to take PO  Dispo: The patient is from:               Anticipated d/c is to: Home              Patient currently is not medically stable to d/c.   Difficult to place patient No        Consultants:  Neurology phone   Procedures:  None  Antimicrobials:    Subjective: She is feeling ok, she fell earlier this am. Denies hitting head. She agrees with rehab for few weeks.    Objective: Vitals:   02/11/21 0944 02/11/21 0950 02/11/21 0955 02/11/21 1028  BP: (!) 163/109 (!) 137/93 (!) 147/101 (!) 137/95  Pulse: 97 91 (!) 106 97  Resp: 18 16 15 18   Temp:    98.1 F (36.7 C)  TempSrc:      SpO2: 100% 100% 100% 99%  Weight:      Height:        Intake/Output Summary (Last 24 hours) at 02/11/2021 1640 Last data filed at 02/10/2021 2100 Gross per 24 hour  Intake 360  ml  Output --  Net 360 ml    Filed Weights   02/08/21 1210  Weight: 49.9 kg    Examination:  General exam: NAD Respiratory system: CTA Cardiovascular system: S 1, S 2 RRR Gastrointestinal system: BS pt, soft, nt Central nervous system: Non focal.  Extremities: Symmetric power   Data Reviewed: I have personally reviewed following labs and imaging studies  CBC: Recent Labs  Lab 02/08/21 1253 02/09/21 0334 02/10/21 0343 02/11/21 0346  WBC 11.9* 7.8 6.6 5.4  NEUTROABS 9.7*  --   --   --   HGB 10.1* 8.5* 8.4* 8.3*  HCT 28.5*  24.8* 24.7* 24.2*  MCV 102.2* 103.3* 103.3* 104.8*  PLT 53* 42* 38* 33*    Basic Metabolic Panel: Recent Labs  Lab 02/08/21 1253 02/08/21 2150 02/09/21 0334 02/09/21 1903 02/10/21 0809 02/11/21 0346  NA 134*  --  135 133* 136 134*  K 2.4*  --  3.5 3.1* 3.3* 4.0  CL 96*  --  101 99 103 103  CO2 24  --  22 20* 23 26  GLUCOSE 136*  --  101* 120* 112* 115*  BUN 7  --  <5* <5* <5* <5*  CREATININE 0.67  --  0.42* 0.56 0.45 0.49  CALCIUM 8.2*  --  8.0* 7.9* 8.1* 8.1*  MG 1.7 2.1  --   --   --   --     GFR: Estimated Creatinine Clearance: 57.1 mL/min (by C-G formula based on SCr of 0.49 mg/dL). Liver Function Tests: Recent Labs  Lab 02/08/21 1253 02/08/21 2150 02/09/21 0334  AST 62*  --  50*  ALT 34  --  28  ALKPHOS 79  --  69  BILITOT 1.4* 1.1 0.8  PROT 7.2  --  6.2*  ALBUMIN 3.7  --  3.0*    Recent Labs  Lab 02/08/21 1253  LIPASE 51    No results for input(s): AMMONIA in the last 168 hours. Coagulation Profile: No results for input(s): INR, PROTIME in the last 168 hours. Cardiac Enzymes: No results for input(s): CKTOTAL, CKMB, CKMBINDEX, TROPONINI in the last 168 hours. BNP (last 3 results) No results for input(s): PROBNP in the last 8760 hours. HbA1C: No results for input(s): HGBA1C in the last 72 hours. CBG: No results for input(s): GLUCAP in the last 168 hours. Lipid Profile: No results for input(s): CHOL, HDL, LDLCALC, TRIG, CHOLHDL, LDLDIRECT in the last 72 hours. Thyroid Function Tests: No results for input(s): TSH, T4TOTAL, FREET4, T3FREE, THYROIDAB in the last 72 hours.  Anemia Panel: No results for input(s): VITAMINB12, FOLATE, FERRITIN, TIBC, IRON, RETICCTPCT in the last 72 hours.  Sepsis Labs: No results for input(s): PROCALCITON, LATICACIDVEN in the last 168 hours.  Recent Results (from the past 240 hour(s))  Urine culture     Status: Abnormal   Collection Time: 02/08/21 12:41 PM   Specimen: Urine, Random  Result Value Ref Range Status    Specimen Description   Final    URINE, RANDOM Performed at [facility] [address], [place], [place] [address]    Special Requests   Final    NONE Performed at [facility], [place] [address], [place], [place] [address]    Culture >=100,000 COLONIES/mL ESCHERICHIA COLI (A)  Final   Report Status 02/10/2021 FINAL  Final   Organism ID, Bacteria ESCHERICHIA COLI (A)  Final      Susceptibility   Escherichia coli - MIC*    AMPICILLIN >=32  RESISTANT Resistant     CEFAZOLIN <=4 SENSITIVE Sensitive     CEFEPIME <=0.12 SENSITIVE Sensitive     CEFTRIAXONE <=0.25 SENSITIVE Sensitive     CIPROFLOXACIN <=0.25 SENSITIVE Sensitive     GENTAMICIN <=1 SENSITIVE Sensitive     IMIPENEM <=0.25 SENSITIVE Sensitive     NITROFURANTOIN <=16 SENSITIVE Sensitive     TRIMETH/SULFA <=20 SENSITIVE Sensitive     AMPICILLIN/SULBACTAM 4 SENSITIVE Sensitive     PIP/TAZO <=4 SENSITIVE Sensitive     * >=100,000 COLONIES/mL ESCHERICHIA COLI  Resp Panel by RT-PCR (Flu A&B, Covid) Nasopharyngeal Swab     Status: None   Collection Time: 02/08/21 12:53 PM   Specimen: Nasopharyngeal Swab; Nasopharyngeal(NP) swabs in vial transport medium  Result Value Ref Range Status   SARS Coronavirus 2 by RT PCR NEGATIVE NEGATIVE Final    Comment: (NOTE) SARS-CoV-2 target nucleic acids are NOT DETECTED.  The SARS-CoV-2 RNA is generally detectable in upper respiratory specimens during the acute phase of infection. The lowest concentration of SARS-CoV-2 viral copies this assay can detect is 138 copies/mL. A negative result does not preclude SARS-Cov-2 infection and should not be used as the sole basis for treatment or other patient management decisions. A negative result may occur with  improper specimen collection/handling, submission of specimen other than nasopharyngeal swab, presence of viral mutation(s) within the areas targeted by this assay, and inadequate number of viral copies(<138  copies/mL). A negative result must be combined with clinical observations, patient history, and epidemiological information. The expected result is Negative.  Fact Sheet for Patients:  EntrepreneurPulse.com.au  Fact Sheet for Healthcare Providers:  IncredibleEmployment.be  This test is no t yet approved or cleared by the [place] FDA and  has been authorized for detection and/or diagnosis of SARS-CoV-2 by FDA under an Emergency Use Authorization (EUA). This EUA will remain  in effect (meaning this test can be used) for the duration of the COVID-19 declaration under Section 564(b)(1) of the Act, 21 U.S.C.section 360bbb-3(b)(1), unless the authorization is terminated  or revoked sooner.       Influenza A by PCR NEGATIVE NEGATIVE Final   Influenza B by PCR NEGATIVE NEGATIVE Final    Comment: (NOTE) The Xpert Xpress SARS-CoV-2/FLU/RSV plus assay is intended as an aid in the diagnosis of influenza from Nasopharyngeal swab specimens and should not be used as a sole basis for treatment. Nasal washings and aspirates are unacceptable for Xpert Xpress SARS-CoV-2/FLU/RSV testing.  Fact Sheet for Patients: EntrepreneurPulse.com.au  Fact Sheet for Healthcare Providers: IncredibleEmployment.be  This test is not yet approved or cleared by the [place] FDA and has been authorized for detection and/or diagnosis of SARS-CoV-2 by FDA under an Emergency Use Authorization (EUA). This EUA will remain in effect (meaning this test can be used) for the duration of the COVID-19 declaration under Section 564(b)(1) of the Act, 21 U.S.C. section 360bbb-3(b)(1), unless the authorization is terminated or revoked.  Performed at [facility], [place] [address] [place], [place] [address]   C Difficile Quick Screen w PCR reflex     Status: None   Collection Time: 02/09/21 11:10 PM   Specimen: STOOL  Result  Value Ref Range Status   C Diff antigen NEGATIVE NEGATIVE Final   C Diff toxin NEGATIVE NEGATIVE Final   C Diff interpretation No C. difficile detected.  Final    Comment: Performed at [facility], [place] [address] [place], [place] [address]  Radiology Studies: CT BIOPSY  Result Date: 17-Feb-2021 INDICATION: Anemia, thrombocytopenia EXAM: CT GUIDED RIGHT ILIAC BONE MARROW ASPIRATION AND CORE BIOPSY Date:  2021-02-17 17-Feb-2021 10:16 am Radiologist:  M. Daryll Brod, MD Guidance:  CT FLUOROSCOPY TIME:  Fluoroscopy Time: None. MEDICATIONS: 1% lidocaine local ANESTHESIA/SEDATION: 2.0 mg IV Versed; 100 mcg IV Fentanyl Moderate Sedation Time:  10 minutes The patient was continuously monitored during the procedure by the interventional radiology nurse under my direct supervision. CONTRAST:  None. COMPLICATIONS: None PROCEDURE: Informed consent was obtained from the patient following explanation of the procedure, risks, benefits and alternatives. The patient understands, agrees and consents for the procedure. All questions were addressed. A time out was performed. The patient was positioned prone and non-contrast localization CT was performed of the pelvis to demonstrate the iliac marrow spaces. Maximal barrier sterile technique utilized including caps, mask, sterile gowns, sterile gloves, large sterile drape, hand hygiene, and Betadine prep. Under sterile conditions and local anesthesia, an 11 gauge coaxial bone biopsy needle was advanced into the right iliac marrow space. Needle position was confirmed with CT imaging. Initially, bone marrow aspiration was performed. Next, the 11 gauge outer cannula was utilized to obtain a right iliac bone marrow core biopsy. Needle was removed. Hemostasis was obtained with compression. The patient tolerated the procedure well. Samples were prepared with the cytotechnologist. No immediate complications. IMPRESSION: CT guided right iliac bone marrow  aspiration and core biopsy. Electronically Signed   By: Jerilynn Mages.  Shick M.D.   On: 2021/02/17 11:28   CT BONE MARROW BIOPSY & ASPIRATION  Result Date: 02-17-2021 INDICATION: Anemia, thrombocytopenia EXAM: CT GUIDED RIGHT ILIAC BONE MARROW ASPIRATION AND CORE BIOPSY Date:  February 17, 2021 2021-02-17 10:16 am Radiologist:  M. Daryll Brod, MD Guidance:  CT FLUOROSCOPY TIME:  Fluoroscopy Time: None. MEDICATIONS: 1% lidocaine local ANESTHESIA/SEDATION: 2.0 mg IV Versed; 100 mcg IV Fentanyl Moderate Sedation Time:  10 minutes The patient was continuously monitored during the procedure by the interventional radiology nurse under my direct supervision. CONTRAST:  None. COMPLICATIONS: None PROCEDURE: Informed consent was obtained from the patient following explanation of the procedure, risks, benefits and alternatives. The patient understands, agrees and consents for the procedure. All questions were addressed. A time out was performed. The patient was positioned prone and non-contrast localization CT was performed of the pelvis to demonstrate the iliac marrow spaces. Maximal barrier sterile technique utilized including caps, mask, sterile gowns, sterile gloves, large sterile drape, hand hygiene, and Betadine prep. Under sterile conditions and local anesthesia, an 11 gauge coaxial bone biopsy needle was advanced into the right iliac marrow space. Needle position was confirmed with CT imaging. Initially, bone marrow aspiration was performed. Next, the 11 gauge outer cannula was utilized to obtain a right iliac bone marrow core biopsy. Needle was removed. Hemostasis was obtained with compression. The patient tolerated the procedure well. Samples were prepared with the cytotechnologist. No immediate complications. IMPRESSION: CT guided right iliac bone marrow aspiration and core biopsy. Electronically Signed   By: Jerilynn Mages.  Shick M.D.   On: Feb 17, 2021 11:28        Scheduled Meds:  calcium carbonate  1 tablet Oral BID WC   fentaNYL        folic acid  1 mg Oral Daily   midazolam       pantoprazole  40 mg Oral BID   vitamin B-12  500 mcg Oral Daily   Continuous Infusions:  cefTRIAXone (ROCEPHIN)  IV 1 g (02/10/21 1805)     LOS: 3 days  Time spent: 35 minutes.     Elmarie Shiley, MD [facility]   If 7PM-7AM, please contact night-coverage [email or web address]  02/11/2021, 4:40 PM

## 2021-02-11 NOTE — Plan of Care (Signed)
  Problem: Education: Goal: Knowledge of General Education information will improve Description: Including pain rating scale, medication(s)/side effects and non-pharmacologic comfort measures Outcome: Progressing   Problem: Clinical Measurements: Goal: Diagnostic test results will improve Outcome: Progressing Goal: Respiratory complications will improve Outcome: Progressing   Problem: Activity: Goal: Risk for activity intolerance will decrease Outcome: Progressing   Problem: Safety: Goal: Ability to remain free from injury will improve Outcome: Progressing   

## 2021-02-11 NOTE — Progress Notes (Signed)
Inpatient Rehab Admissions Coordinator:   Consult received and chart reviewed.  Pt does not appear to have the medical necessity to warrant a CIR admission.  CIR will sign off.    Estill Dooms, PT, DPT Admissions Coordinator [phone] 02/11/21  3:26 PM

## 2021-02-11 NOTE — Progress Notes (Signed)
Chief Complaint: Patient was seen in consultation today for bone marrow biopsy  Referring Physician(s): Dr. Julien [name] [name]  Supervising Physician: [name] [name] [name]  Patient Status: [facility] - In-pt  History of Present Illness: [name] [name] [name] is [name] [name] 60 y.o. female with multiple medical issues being worked up for anemia and thrombocytopenia. IR is asked to perform bone marrow biopsy PMHx, meds, labs, imaging, allergies reviewed. Feels well, no recent fevers, chills, illness. Has been NPO today as directed.    History reviewed. No pertinent past medical history.  History reviewed. No pertinent surgical history.  Allergies: Patient has no known allergies.  Medications:  Current Facility-Administered Medications:    acetaminophen (TYLENOL) tablet 650 mg, 650 mg, Oral, Q6H PRN, 650 mg at 02/11/21 0658 **OR** acetaminophen (TYLENOL) suppository 650 mg, 650 mg, Rectal, Q6H PRN, [name] [name] [name], [name] [name] [name], [name] [name] [name]   calcium carbonate (OS-CAL - dosed in mg of elemental calcium) tablet 500 mg of elemental calcium, 1 tablet, Oral, BID WC, [name] [name] [name], [name] [name] [name], [name] [name] [name], 500 mg of elemental calcium at 02/10/21 1754   cefTRIAXone (ROCEPHIN) 1 g in sodium chloride 0.9 % 100 mL IVPB, 1 g, Intravenous, Q24H, [name] [name] [name], [name] [name] [name], [name] [name] [name], Last Rate: 200 mL/hr at 02/10/21 1805, 1 g at 54/36/06 7703   folic acid (FOLVITE) tablet 1 mg, 1 mg, Oral, Daily, [name] [name] [name], [name] [name] [name], 1 mg at 02/10/21 1007   hydrALAZINE (APRESOLINE) tablet 25 mg, 25 mg, Oral, Q8H PRN, [name] [name] [name], [name] [name] [name], [name] [name] [name]   LORazepam (ATIVAN) tablet 0.5 mg, 0.5 mg, Oral, Q6H PRN, [name] [name] [name], [name] [name] [name], [name] [name] [name], 0.5 mg at 02/11/21 0151   pantoprazole (PROTONIX) EC tablet 40 mg, 40 mg, Oral, BID, [name] [name] [name], [name] [name] [name], 40 mg at 02/10/21 2106   traMADol (ULTRAM) tablet 50 mg, 50 mg, Oral, Q6H PRN, [name] [name] [name], [name] [name] [name], [name] [name] [name], 50 mg at 02/10/21 2106   vitamin B-12 (CYANOCOBALAMIN) tablet 500 mcg, 500 mcg, Oral, Daily, [name] [name] [name], [name] [name] [name], [name] [name] [name], 500 mcg at 02/10/21 0955    History reviewed. No pertinent  family history.  Social History   Socioeconomic History   Marital status: Unknown    Spouse name: Not on file   Number of children: Not on file   Years of education: Not on file   Highest education level: Not on file  Occupational History   Not on file  Tobacco Use   Smoking status: Never   Smokeless tobacco: Never  Substance and Sexual Activity   Alcohol use: Yes    Comment: spcoa;   Drug use: Never   Sexual activity: Never  Other Topics Concern   Not on file  Social History Narrative   Not on file   Social Determinants of Health   Financial Resource Strain: Not on file  Food Insecurity: Not on file  Transportation Needs: Not on file  Physical Activity: Not on file  Stress: Not on file  Social Connections: Not on file    Review of Systems: [name] [name] 12 point ROS discussed and pertinent positives are indicated in the HPI above.  All other systems are negative.  Review of Systems  Vital Signs: BP (!) 139/101 (BP Location: Right Arm)   Pulse 95   Temp 98.2 F (36.8 C) (Oral)   Resp 20   Ht 5' 1"  (1.549 [name] [name])   Wt 49.9 kg   LMP  (LMP Unknown) Comment: Last menstrual period was 8-9 years ago  SpO2 100%   BMI 20.78 kg/[name] [name]   Physical Exam Constitutional:      Appearance: Normal appearance.  HENT:  Mouth/Throat:     Mouth: Mucous membranes are moist.     Pharynx: Oropharynx is clear.  Cardiovascular:     Rate and Rhythm: Normal rate and regular rhythm.     Heart sounds: Normal heart sounds.  Pulmonary:     Effort: Pulmonary effort is normal. No respiratory distress.     Breath sounds: Normal breath sounds.  Skin:    General: Skin is warm and dry.  Neurological:     General: No focal deficit present.     Mental Status: She is alert and oriented to person, place, and time.  Psychiatric:        Mood and Affect: Mood normal.        Thought Content: Thought content normal.        Judgment: Judgment normal.      Imaging: CT HEAD WO CONTRAST  Result Date:  02/08/2021 CLINICAL DATA:  Acute neuro deficit.  Vision change.  Unsteady gait EXAM: CT HEAD WITHOUT CONTRAST TECHNIQUE: Contiguous axial images were obtained from the base of the skull through the vertex without intravenous contrast. COMPARISON:  None. FINDINGS: Brain: Generalized atrophy. Periventricular white matter hypodensity bilaterally appears symmetric. Negative for acute infarct, hemorrhage, mass. 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 Electronically Signed   By: Franchot Gallo [name] [name].D.   On: 02/08/2021 13:34   MR BRAIN WO CONTRAST  Result Date: 02/08/2021 CLINICAL DATA:  Transient ischemic attack. EXAM: MRI HEAD WITHOUT CONTRAST TECHNIQUE: Multiplanar, multiecho pulse sequences of the brain and surrounding structures were obtained without intravenous contrast. COMPARISON:  Same day CT head. FINDINGS: Brain: No acute infarction, hemorrhage, hydrocephalus, extra-axial collection or mass lesion. Moderate atrophy with ventriculomegaly. Cerebellar tonsils are in normal position. Unremarkable sella. Vascular: Major arterial flow voids are maintained at the skull base. Skull and upper cervical spine: Normal marrow signal. Sinuses/Orbits: Mild ethmoid air cell mucosal thickening. No air-fluid levels. Unremarkable orbits. Other: No sizable mastoid effusions. IMPRESSION: 1. No acute infarct. 2. Ventriculomegaly, which may be ex vacuo in the setting of moderate atrophy. Normal pressure hydrocephalus could have [name] [name] similar appearance in the correct clinical setting. 3. Mild to moderate T2/FLAIR hyperintensities within the white matter. This finding is nonspecific but can be seen in the setting of chronic microvascular ischemia, [name] [name] demyelinating process such as multiple sclerosis, chronic migraines, or as the sequelae of [name] [name] prior infectious or inflammatory process.  Electronically Signed   By: Margaretha Sheffield [name] [name] [name]   On: 02/08/2021 17:34   CT ABDOMEN PELVIS W CONTRAST  Result Date: 02/09/2021 CLINICAL DATA:  Abdominal pain with nausea vomiting EXAM: CT ABDOMEN AND PELVIS WITH CONTRAST TECHNIQUE: Multidetector CT imaging of the abdomen and pelvis was performed using the standard protocol following bolus administration of intravenous contrast. CONTRAST:  76m OMNIPAQUE IOHEXOL 300 MG/ML  SOLN COMPARISON:  None. FINDINGS: Lower chest: No acute abnormality. Normal size heart. Left anterior descending coronary artery calcifications. No significant pericardial effusion/thickening. Hepatobiliary: Hepatomegaly measuring 18.2 cm in maximum craniocaudal dimension. Diffusely decreased attenuation of the hepatic parenchyma with geographic regions of hyperattenuation involving the gallbladder fossa anterior left lobe of the liver and posterior right lobe of the liver, which persist on delayed imaging, for instance on image 27/2 and 32/2. Gallbladder is grossly unremarkable. No biliary ductal dilation. Pancreas: Within normal limits. Spleen: Calcified splenic granulomata.  Normal size spleen. Adrenals/Urinary Tract: Adrenal glands are unremarkable. Kidneys are normal, without renal calculi,  focal lesion, or hydronephrosis. Bladder is unremarkable. Stomach/Bowel: Mild symmetric thickening of the gastric antrum. Normal positioning of the duodenum/ligament of Treitz. Radiopaque enteric contrast traverses the hepatic flexure. No pathologic dilation of small bowel. Normal appendix. Gas and fluid visualized throughout the colon. Vascular/Lymphatic: Aortic atherosclerosis without aneurysmal dilation. The hepatic, portal and splenic veins appear patent. Prominent periportal lymph nodes measuring up to 5 mm on image 32/2, likely reactive. No pathologically enlarged abdominal or pelvic lymph nodes. Reproductive: Status post hysterectomy. No adnexal masses. Other: No abdominopelvic ascites.  Musculoskeletal: Mild multilevel degenerative changes spine. No aggressive lytic or blastic lesion of bone. IMPRESSION: 1. Hepatomegaly with diffuse hepatic hypoattenuation and superimposed geographic regions of hyperattenuation involving the gallbladder fossa, anterior left lobe of the liver and posterior right lobe of the liver, which persist on delayed imaging. Findings which likely reflect hepatic steatosis with focal fatty sparing. 2. Mild symmetric thickening of the gastric antrum, which may represent gastritis. 3. Gas and fluid visualized throughout the colon suggestive of diarrheal state. No evidence of bowel obstruction. 4. Aortic atherosclerosis.  Aortic Atherosclerosis (ICD10-I70.0). Electronically Signed   By: Dahlia Bailiff [name] [name] [name]   On: 02/09/2021 13:59    Labs:  CBC: Recent Labs    02/08/21 1253 02/09/21 0334 02/10/21 0343 02/11/21 0346  WBC 11.9* 7.8 6.6 5.4  HGB 10.1* 8.5* 8.4* 8.3*  HCT 28.5* 24.8* 24.7* 24.2*  PLT 53* 42* 38* 33*    COAGS: No results for input(s): INR, APTT in the last 8760 hours.  BMP: Recent Labs    02/09/21 0334 02/09/21 1903 02/10/21 0809 02/11/21 0346  NA 135 133* 136 134*  K 3.5 3.1* 3.3* 4.0  CL 101 99 103 103  CO2 22 20* 23 26  GLUCOSE 101* 120* 112* 115*  BUN <5* <5* <5* <5*  CALCIUM 8.0* 7.9* 8.1* 8.1*  CREATININE 0.42* 0.56 0.45 0.49  GFRNONAA >60 >60 >60 >60    LIVER FUNCTION TESTS: Recent Labs    02/08/21 1253 02/08/21 2150 02/09/21 0334  BILITOT 1.4* 1.1 0.8  AST 62*  --  50*  ALT 34  --  28  ALKPHOS 79  --  69  PROT 7.2  --  6.2*  ALBUMIN 3.7  --  3.0*    TUMOR MARKERS: No results for input(s): AFPTM, CEA, CA199, CHROMGRNA in the last 8760 hours.  Assessment and Plan: Anemia, thrombocytopenia Plan for image guided bone marrow biopsy Labs reviewed. Risks and benefits of bone marrow bx was discussed with the patient and/or patient's family including, but not limited to bleeding, infection, damage to adjacent  structures or low yield requiring additional tests.  All of the questions were answered and there is agreement to proceed.  Consent signed and in chart.   Thank you for this interesting consult.  I greatly enjoyed meeting Natividad Brood and look forward to participating in their care.  [name] [name] copy of this report was sent to the requesting provider on this date.  Electronically Signed: Ascencion Dike, PA-C 02/11/2021, 9:00 AM   I spent [name] [name] total of 20 minutes in face to face in clinical consultation, greater than 50% of which was counseling/coordinating care for bone marrow bx

## 2021-02-11 NOTE — Progress Notes (Signed)
   02/11/21 3244  What Happened  Was fall witnessed? Yes  Who witnessed fall? Teresa Pelton, NT  Patients activity before fall bathroom-assisted  Point of contact other (comment) (floor)  Was patient injured? Yes  Follow Up  MD notified M. Denney (On call provider)  Time MD notified 91  Family notified Yes - comment (Sister Osborne Oman)  Time family notified [phone]  Additional tests No  Simple treatment Dressing  Progress note created (see row info) Yes  Adult Fall Risk Assessment  Risk Factor Category (scoring not indicated) High fall risk per protocol (document High fall risk)  Patient Fall Risk Level High fall risk  Adult Fall Risk Interventions  Required Bundle Interventions *See Row Information* High fall risk - low, moderate, and high requirements implemented  Additional Interventions PT/OT need assessed if change in mobility from baseline;Use of appropriate toileting equipment (bedpan, BSC, etc.)  Screening for Fall Injury Risk (To be completed on HIGH fall risk patients) - Assessing Need for Floor Mats  Risk For Fall Injury- Criteria for Floor Mats Previous fall this admission  Vitals  BP (!) 139/101  MAP (mmHg) 112  BP Location Right Arm  BP Method Automatic  Patient Position (if appropriate) Lying  Pulse Rate 95  Pulse Rate Source Monitor  Resp 20  Oxygen Therapy  SpO2 100 %  O2 Device Room Air  Pain Assessment  Pain Scale 0-10  Pain Score 4  Pain Type Acute pain  Pain Location Finger (Comment which one) (right 2nd finger)  Pain Orientation Distal;Right  Pain Descriptors / Indicators Aching;Discomfort  Pain Frequency Constant  Pain Onset Gradual  Patients Stated Pain Goal 2  Pain Intervention(s) Medication (See eMAR);Elevated extremity;Emotional support  Multiple Pain Sites No  Neurological  Neuro (WDL) WDL  Level of Consciousness Alert  Orientation Level Oriented X4  Cognition Follows commands;Appropriate at baseline  Speech Clear  Pupil  Assessment  Yes  R Pupil Size (mm) 4  R Pupil Shape Round  R Pupil Reaction Brisk  L Pupil Size (mm) 4  L Pupil Shape Round  L Pupil Reaction Brisk  R Hand Grip Present;Moderate  L Hand Grip Present;Moderate   Glasgow Coma Scale  Eye Opening 4  Best Verbal Response (NON-intubated) 5  Best Motor Response 6  Glasgow Coma Scale Score 15  Musculoskeletal  Musculoskeletal (WDL) X  Assistive Device BSC;Front wheel walker  Generalized Weakness Yes  Weight Bearing Restrictions No  Integumentary  Integumentary (WDL) X  Skin Color Appropriate for ethnicity  Skin Condition Dry  Skin Integrity Abrasion;Ecchymosis  Abrasion Location Knee  Abrasion Location Orientation Bilateral  Abrasion Intervention Cleansed;Thin film  Ecchymosis Location Finger (Comment which one);Knee (right 2nd finger)  Ecchymosis Location Orientation Right;Left;Posterior;Distal;Anterior  Ecchymosis Intervention Cleansed  Skin Turgor Non-tenting  Pain Assessment  Date Pain First Started 02/08/21  Result of Injury No  Pain Assessment  Work-Related Injury No  Pain Screening  Clinical Progression Not changed

## 2021-02-11 NOTE — Procedures (Signed)
Interventional Radiology Procedure Note  Procedure: CT BM ASP AND CORE    Complications: None  Estimated Blood Loss:  MIN  Findings: 11 G CORE AND ASP    [name], MD    

## 2021-02-11 NOTE — Telephone Encounter (Signed)
R/s per 6/16 sch msg, left message on sister phone , no answer on main line.

## 2021-02-11 NOTE — Progress Notes (Signed)
    BRIEF OVERNIGHT PROGRESS REPORT  Called by RN for patient witnessed fall in room. Patient was with NT and was using the toilet and "slid down to her knees" She apparently did not lose consciousness. She has equal strength against resistance in both legs, against gravity, is able to flex knees and when pushing against resistance. The Left knee has a slight abrasion with no discoloration. She also states that she "hit a finger on something" (right hand 3rd finger) No obvious deformity, she has grip strength and can close hand to a fist. She states that she is in no pain or distress and appears comfortable in bed.     Chinita Greenland MSNA ACNPC-AG Acute Care Nurse Practitioner [facility]  [facility]

## 2021-02-12 LAB — CBC
HCT: 26.8 % — ABNORMAL LOW (ref 36.0–46.0)
Hemoglobin: 9.4 g/dL — ABNORMAL LOW (ref 12.0–15.0)
MCH: 36.2 pg — ABNORMAL HIGH (ref 26.0–34.0)
MCHC: 35.1 g/dL (ref 30.0–36.0)
MCV: 103.1 fL — ABNORMAL HIGH (ref 80.0–100.0)
Platelets: 34 10*3/uL — ABNORMAL LOW (ref 150–400)
RBC: 2.6 MIL/uL — ABNORMAL LOW (ref 3.87–5.11)
RDW: 16.1 % — ABNORMAL HIGH (ref 11.5–15.5)
WBC: 8.3 10*3/uL (ref 4.0–10.5)
nRBC: 0.2 % (ref 0.0–0.2)

## 2021-02-12 MED ORDER — SUCRALFATE 1 G PO TABS
1.0000 g | ORAL_TABLET | Freq: Three times a day (TID) | ORAL | Status: DC
  Administered 2021-02-12 – 2021-02-13 (×5): 1 g via ORAL
  Filled 2021-02-12 (×5): qty 1

## 2021-02-12 MED ORDER — ARTIFICIAL TEARS OPHTHALMIC OINT
TOPICAL_OINTMENT | OPHTHALMIC | Status: DC | PRN
  Filled 2021-02-12: qty 3.5

## 2021-02-12 NOTE — Progress Notes (Signed)
OT Cancellation Note  Patient Details Name: [name] [name] MRN: [id number] DOB: 1961/05/10   Cancelled Treatment:    Reason Eval/Treat Not Completed: Fatigue/lethargy limiting ability to participate. Pt reports she recently worked with PT and struggled greatly with mobility/transfers. Pt requesting to rest for now. Plan to reattempt at a later time/date.   Raynald Kemp, OT Acute Rehabilitation Services Pager: [phone] Office: [phone]  02/12/2021, 1:08 PM

## 2021-02-12 NOTE — Progress Notes (Signed)
PROGRESS NOTE    [name] [name]  PFX:[id number] DOB: [birth year] DOA: 02/08/2021 PCP: Pcp, No   Brief Narrative: 60 year old with no significant past medical history who presents complaining of nausea vomiting and vision changes and weakness.  She reports intermittent nausea and vomiting for the last 3 months.  Reports intermittent blurry vision.  She denies headache.  She reports worsening numbness and tingling in her hands and feet.  She feels weak all over.  Evaluation in the ED: Patient was found to have a potassium of 2.4.  UA consistent with UTI.     Assessment & Plan:   Active Problems:   Hypokalemia   Lower urinary tract infectious disease  1-Hypokalemia, Hyponatremia, Hypocalcemia: -Patient received IV potassium and oral potassium. Continue with oral potassium.  Received calcium supplement. Started oral calcium Improved. Monitor labs,   2-Anemia; Thrombocytopenia;  Folic acid low. Replete IV.  Thrombocytopenia could de related to infection, HO alcohol use.  Peripheral smear. Macrocytic anemia, thrombocytopenia.  Platelet further decreased today. Hematology consulted.  Dr Julien Nordmann recommend Bone marrow biopsy. Bone marrow biopsy 6/16. Bone marrow biopsy pending.  Platelet stable.   3-Epigastric pain;  CT abdomen ; possible gastritis, lipase negative Started PPI. Added Carafate today.  Might need GI follow up. Endoscopy/. Patient decline inpatient evaluation.  Tramadol PRN.  C diff negative.    4-UTI; Continue with ceftriaxone. Plan for 5 days treatment.  Follow urine culture. Growing E coli.   5-Neuropathy;  Numbness and tingling stable.  Started H29 and folic acid.  MRI negative for stroke. Discussed with neurology MRI finding are non specific, appears more related to chronic vascular diseases than MS. Needs follow up with neurologist out patient.   Mild transaminases; CT hepatic steatosis. Hepatomegaly.  HTN; BP better, without medication.  Fall, denies  hitting head. Has small hematoma second finger right hands. Plan to apply ice, keep elevated. Decline x ray at this time.  Blurry vision, decrease vision right Eye;  Ophthalmology consulted.   Estimated body mass index is 20.78 kg/m as calculated from the following:   Height as of this encounter: 5' 1" (1.549 m).   Weight as of this encounter: 49.9 kg.   DVT prophylaxis: SCD Code Status: Partial Code; no intubation  Family Communication: Care discussed with patient.  Disposition Plan:  Status is: Inpatient  Remains inpatient appropriate because:IV treatments appropriate due to intensity of illness or inability to take PO  Dispo: The patient is from:               Anticipated d/c is to: Home              Patient currently is not medically stable to d/c.   Difficult to place patient No        Consultants:  Neurology phone   Procedures:  None  Antimicrobials:    Subjective: She is having epigastric pain. Mild nausea today.   Objective: Vitals:   02/11/21 1028 02/11/21 2009 02/12/21 0627 02/12/21 1438  BP: (!) 137/95 (!) 140/99 (!) 152/98 136/72  Pulse: 97 (!) 109 (!) 101 77  Resp: _0 Temp: 98.1 F (36.7 C) 98.3 F (36.8 C) 98.2 F (36.8 C) 97.6 F (36.4 C)  TempSrc:    Oral  SpO2: 99% 99% 100% 96%  Weight:      Height:        Intake/Output Summary (Last 24 hours) at 02/12/2021 1852 Last data filed at 02/12/2021 0930 Gross per 24 hour  Intake 240 ml  Output --  Net 240 ml    Filed Weights   02/08/21 1210  Weight: 49.9 kg    Examination:  General exam: NAD Respiratory system: CTA Cardiovascular system: S 1, S 2 RRR Gastrointestinal system: BS present, soft,  Central nervous system: Non focal.  Extremities: Symmetric power. Bruise finger after fall. Less swelling finger,    Data Reviewed: I have personally reviewed following labs and imaging studies  CBC: Recent Labs  Lab 02/08/21 1253 02/09/21 0334 02/10/21 0343  02/11/21 0346 02/12/21 0949  WBC 11.9* 7.8 6.6 5.4 8.3  NEUTROABS 9.7*  --   --   --   --   HGB 10.1* 8.5* 8.4* 8.3* 9.4*  HCT 28.5* 24.8* 24.7* 24.2* 26.8*  MCV 102.2* 103.3* 103.3* 104.8* 103.1*  PLT 53* 42* 38* 33* 34*    Basic Metabolic Panel: Recent Labs  Lab 02/08/21 1253 02/08/21 2150 02/09/21 0334 02/09/21 1903 02/10/21 0809 02/11/21 0346  NA 134*  --  135 133* 136 134*  K 2.4*  --  3.5 3.1* 3.3* 4.0  CL 96*  --  101 99 103 103  CO2 24  --  22 20* 23 26  GLUCOSE 136*  --  101* 120* 112* 115*  BUN 7  --  <5* <5* <5* <5*  CREATININE 0.67  --  0.42* 0.56 0.45 0.49  CALCIUM 8.2*  --  8.0* 7.9* 8.1* 8.1*  MG 1.7 2.1  --   --   --   --     GFR: Estimated Creatinine Clearance: 57.1 mL/min (by C-G formula based on SCr of 0.49 mg/dL). Liver Function Tests: Recent Labs  Lab 02/08/21 1253 02/08/21 2150 02/09/21 0334  AST 62*  --  50*  ALT 34  --  28  ALKPHOS 79  --  69  BILITOT 1.4* 1.1 0.8  PROT 7.2  --  6.2*  ALBUMIN 3.7  --  3.0*    Recent Labs  Lab 02/08/21 1253  LIPASE 51    No results for input(s): AMMONIA in the last 168 hours. Coagulation Profile: No results for input(s): INR, PROTIME in the last 168 hours. Cardiac Enzymes: No results for input(s): CKTOTAL, CKMB, CKMBINDEX, TROPONINI in the last 168 hours. BNP (last 3 results) No results for input(s): PROBNP in the last 8760 hours. HbA1C: No results for input(s): HGBA1C in the last 72 hours. CBG: No results for input(s): GLUCAP in the last 168 hours. Lipid Profile: No results for input(s): CHOL, HDL, LDLCALC, TRIG, CHOLHDL, LDLDIRECT in the last 72 hours. Thyroid Function Tests: No results for input(s): TSH, T4TOTAL, FREET4, T3FREE, THYROIDAB in the last 72 hours.  Anemia Panel: No results for input(s): VITAMINB12, FOLATE, FERRITIN, TIBC, IRON, RETICCTPCT in the last 72 hours.  Sepsis Labs: No results for input(s): PROCALCITON, LATICACIDVEN in the last 168 hours.  Recent Results (from  the past 240 hour(s))  Urine culture     Status: Abnormal   Collection Time: 02/08/21 12:41 PM   Specimen: Urine, Random  Result Value Ref Range Status   Specimen Description   Final    URINE, RANDOM Performed at [facility] [address], [place], [place] [address]    Special Requests   Final    NONE Performed at [facility], [place] [address], [place], [place] [address]    Culture >=100,000 COLONIES/mL ESCHERICHIA COLI (A)  Final   Report Status 02/10/2021 FINAL  Final   Organism ID, Bacteria ESCHERICHIA COLI (  A)  Final      Susceptibility   Escherichia coli - MIC*    AMPICILLIN >=32 RESISTANT Resistant     CEFAZOLIN <=4 SENSITIVE Sensitive     CEFEPIME <=0.12 SENSITIVE Sensitive     CEFTRIAXONE <=0.25 SENSITIVE Sensitive     CIPROFLOXACIN <=0.25 SENSITIVE Sensitive     GENTAMICIN <=1 SENSITIVE Sensitive     IMIPENEM <=0.25 SENSITIVE Sensitive     NITROFURANTOIN <=16 SENSITIVE Sensitive     TRIMETH/SULFA <=20 SENSITIVE Sensitive     AMPICILLIN/SULBACTAM 4 SENSITIVE Sensitive     PIP/TAZO <=4 SENSITIVE Sensitive     * >=100,000 COLONIES/mL ESCHERICHIA COLI  Resp Panel by RT-PCR (Flu A&B, Covid) Nasopharyngeal Swab     Status: None   Collection Time: 02/08/21 12:53 PM   Specimen: Nasopharyngeal Swab; Nasopharyngeal(NP) swabs in vial transport medium  Result Value Ref Range Status   SARS Coronavirus 2 by RT PCR NEGATIVE NEGATIVE Final    Comment: (NOTE) SARS-CoV-2 target nucleic acids are NOT DETECTED.  The SARS-CoV-2 RNA is generally detectable in upper respiratory specimens during the acute phase of infection. The lowest concentration of SARS-CoV-2 viral copies this assay can detect is 138 copies/mL. A negative result does not preclude SARS-Cov-2 infection and should not be used as the sole basis for treatment or other patient management decisions. A negative result may occur with  improper specimen collection/handling, submission of  specimen other than nasopharyngeal swab, presence of viral mutation(s) within the areas targeted by this assay, and inadequate number of viral copies(<138 copies/mL). A negative result must be combined with clinical observations, patient history, and epidemiological information. The expected result is Negative.  Fact Sheet for Patients:  EntrepreneurPulse.com.au  Fact Sheet for Healthcare Providers:  IncredibleEmployment.be  This test is no t yet approved or cleared by the [place] FDA and  has been authorized for detection and/or diagnosis of SARS-CoV-2 by FDA under an Emergency Use Authorization (EUA). This EUA will remain  in effect (meaning this test can be used) for the duration of the COVID-19 declaration under Section 564(b)(1) of the Act, 21 U.S.C.section 360bbb-3(b)(1), unless the authorization is terminated  or revoked sooner.       Influenza A by PCR NEGATIVE NEGATIVE Final   Influenza B by PCR NEGATIVE NEGATIVE Final    Comment: (NOTE) The Xpert Xpress SARS-CoV-2/FLU/RSV plus assay is intended as an aid in the diagnosis of influenza from Nasopharyngeal swab specimens and should not be used as a sole basis for treatment. Nasal washings and aspirates are unacceptable for Xpert Xpress SARS-CoV-2/FLU/RSV testing.  Fact Sheet for Patients: EntrepreneurPulse.com.au  Fact Sheet for Healthcare Providers: IncredibleEmployment.be  This test is not yet approved or cleared by the [place] FDA and has been authorized for detection and/or diagnosis of SARS-CoV-2 by FDA under an Emergency Use Authorization (EUA). This EUA will remain in effect (meaning this test can be used) for the duration of the COVID-19 declaration under Section 564(b)(1) of the Act, 21 U.S.C. section 360bbb-3(b)(1), unless the authorization is terminated or revoked.  Performed at [facility], [place]  [address] [place], [place] [address]   C Difficile Quick Screen w PCR reflex     Status: None   Collection Time: 02/09/21 11:10 PM   Specimen: STOOL  Result Value Ref Range Status   C Diff antigen NEGATIVE NEGATIVE Final   C Diff toxin NEGATIVE NEGATIVE Final   C Diff interpretation No C. difficile detected.  Final    Comment: Performed  at [facility], [place] [address] [place], [place] [address]          Radiology Studies: CT BIOPSY  Result Date: 02-21-2021 INDICATION: Anemia, thrombocytopenia EXAM: CT GUIDED RIGHT ILIAC BONE MARROW ASPIRATION AND CORE BIOPSY Date:  2021/02/21 Feb 21, 2021 10:16 am Radiologist:  M. Daryll Brod, MD Guidance:  CT FLUOROSCOPY TIME:  Fluoroscopy Time: None. MEDICATIONS: 1% lidocaine local ANESTHESIA/SEDATION: 2.0 mg IV Versed; 100 mcg IV Fentanyl Moderate Sedation Time:  10 minutes The patient was continuously monitored during the procedure by the interventional radiology nurse under my direct supervision. CONTRAST:  None. COMPLICATIONS: None PROCEDURE: Informed consent was obtained from the patient following explanation of the procedure, risks, benefits and alternatives. The patient understands, agrees and consents for the procedure. All questions were addressed. A time out was performed. The patient was positioned prone and non-contrast localization CT was performed of the pelvis to demonstrate the iliac marrow spaces. Maximal barrier sterile technique utilized including caps, mask, sterile gowns, sterile gloves, large sterile drape, hand hygiene, and Betadine prep. Under sterile conditions and local anesthesia, an 11 gauge coaxial bone biopsy needle was advanced into the right iliac marrow space. Needle position was confirmed with CT imaging. Initially, bone marrow aspiration was performed. Next, the 11 gauge outer cannula was utilized to obtain a right iliac bone marrow core biopsy. Needle was removed. Hemostasis was obtained with compression. The  patient tolerated the procedure well. Samples were prepared with the cytotechnologist. No immediate complications. IMPRESSION: CT guided right iliac bone marrow aspiration and core biopsy. Electronically Signed   By: Jerilynn Mages.  Shick M.D.   On: 02/21/2021 11:28   CT BONE MARROW BIOPSY & ASPIRATION  Result Date: 21-Feb-2021 INDICATION: Anemia, thrombocytopenia EXAM: CT GUIDED RIGHT ILIAC BONE MARROW ASPIRATION AND CORE BIOPSY Date:  02/21/21 February 21, 2021 10:16 am Radiologist:  M. Daryll Brod, MD Guidance:  CT FLUOROSCOPY TIME:  Fluoroscopy Time: None. MEDICATIONS: 1% lidocaine local ANESTHESIA/SEDATION: 2.0 mg IV Versed; 100 mcg IV Fentanyl Moderate Sedation Time:  10 minutes The patient was continuously monitored during the procedure by the interventional radiology nurse under my direct supervision. CONTRAST:  None. COMPLICATIONS: None PROCEDURE: Informed consent was obtained from the patient following explanation of the procedure, risks, benefits and alternatives. The patient understands, agrees and consents for the procedure. All questions were addressed. A time out was performed. The patient was positioned prone and non-contrast localization CT was performed of the pelvis to demonstrate the iliac marrow spaces. Maximal barrier sterile technique utilized including caps, mask, sterile gowns, sterile gloves, large sterile drape, hand hygiene, and Betadine prep. Under sterile conditions and local anesthesia, an 11 gauge coaxial bone biopsy needle was advanced into the right iliac marrow space. Needle position was confirmed with CT imaging. Initially, bone marrow aspiration was performed. Next, the 11 gauge outer cannula was utilized to obtain a right iliac bone marrow core biopsy. Needle was removed. Hemostasis was obtained with compression. The patient tolerated the procedure well. Samples were prepared with the cytotechnologist. No immediate complications. IMPRESSION: CT guided right iliac bone marrow aspiration and  core biopsy. Electronically Signed   By: Jerilynn Mages.  Shick M.D.   On: 21-Feb-2021 11:28        Scheduled Meds:  calcium carbonate  1 tablet Oral BID WC   folic acid  1 mg Oral Daily   pantoprazole  40 mg Oral BID   sucralfate  1 g Oral TID WC & HS   vitamin B-12  500 mcg Oral Daily  Continuous Infusions:     LOS: 4 days    Time spent: 35 minutes.     Elmarie Shiley, MD [facility]   If 7PM-7AM, please contact night-coverage [email or web address]  02/12/2021, 6:52 PM

## 2021-02-12 NOTE — Consult Note (Signed)
Chief Complaint  Patient presents with   Weakness  :      Ophthalmology HPI: This is a 60 y.o.  female with a past ocular history of refractive error that presents with blurry vision OU starting last Friday. State she woke up and felt like things are more blurry than they were before. Worse with reading than distance.  No eye pain, flashes of light, floaters, double vision, curtains coming over vision. This is equal and in both eyes.   She initially presented to the ED with n/v and weakness. Found to be hypokalemic, hyponatremic, hypocalcemic, thrombocytopenic, anemic, and have a UTI.  Patient has a history of alcohol abuse.      Past Ocular History: Refractive error    Last Eye Exam: 10 years ago    Primary Eye Care:      Social History   Socioeconomic History   Marital status: Unknown    Spouse name: Not on file   Number of children: Not on file   Years of education: Not on file   Highest education level: Not on file  Occupational History   Not on file  Tobacco Use   Smoking status: Never   Smokeless tobacco: Never  Substance and Sexual Activity   Alcohol use: Yes    Comment: spcoa;   Drug use: Never   Sexual activity: Never  Other Topics Concern   Not on file  Social History Narrative   Not on file   Social Determinants of Health   Financial Resource Strain: Not on file  Food Insecurity: Not on file  Transportation Needs: Not on file  Physical Activity: Not on file  Stress: Not on file  Social Connections: Not on file  Intimate Partner Violence: Not on file     No Known Allergies   No current facility-administered medications on file prior to encounter.   Current Outpatient Medications on File Prior to Encounter  Medication Sig Dispense Refill   naproxen sodium (ALEVE) 220 MG tablet Take 220 mg by mouth daily as needed (pain).       ROS    Exam:  General: Awake, Alert, Oriented *3  Vision (near): with correction   OD: 20/100  equivalent  OS: 20/100 equivalent  Confrontational Field:   Full to count fingers, both eyes  Extraocular Motility:  Full ductions and versions, both eyes  [place]:   Trace commitant exodeviation without vertical.   External:   Normal Symmetry, V1-3 intact, infraorbital nerve appears intact.   Pupils  OD: 71mm to 85mm reactive without afferent pupillary defect (APD)  OS: 58mm to 102mm reactive without afferent pupillary defect (APD)   IOP(tonopen  OD: 15  OS: 14  Slit Lamp Exam:  Lids/Lashes  OD: Normal Lids and lashes, No lesion or injury  OS: Normal lids and lashes, nor lesion or injury  Conjucntiva/Sclera  OD: White and quiet  OS: White and quiet  Cornea  OD: Clear without abrasion or defect  OS: Clear without abrasion or defect  Anterior Chamber  OD: Deep and quiet  OS: Deep and quiet  Iris  OD: Normal iris architecture  OS: Normal Iris Architecture   Lens  OD: 1+ NSC  OS: 1+ NSC   Anterior Vitreous  OD: Clear, without cell  OS: Clear without cell   POSTERIOR POLE EXAM (Dialated with phenylephrine and tropicamide.Dilation may last up to 24 hours)  View:   OD: 20/20 view without opacities  OS: 20/20 view without opacities  Vitreous:  OD: Clear, no cell  OS: Clear, no cell  Disc:   OD: flat, sharp margin, with appropriate color  OS: flat, sharp margin, with appropriate color  C:D Ratio:   OD: 0.1  OS: 0.1  Macula  OD: Flat, with appropriate light reflex  OS: Flat with appropriate light reflex  Vessels  OD: Normal vasculature  OS: Normal vasculature  Periphery  OD: Flat 360 degrees without tear, hole or detachment  OS: Flat 360 degrees without tear, hole or detachment     Assessment and Plan:   This is 60 y.o.  female with dry eyes, refractive error and mild cataracts. Unable to rule out methanol or b12 deficency optic neuropathy while inpatient.   Recommend artificial tears as needed while in the hospital.   No acute ophthalmic  intervention required.   Follow up as outpatient within 1 month of discharge for further evaluation and management of blurry vision.      Mack Hook, M.D.  [facility] [address] [place], [place] [address] [phone] ([phone]

## 2021-02-12 NOTE — Plan of Care (Signed)
  Problem: Health Behavior/Discharge Planning: Goal: Ability to manage health-related needs will improve Outcome: Progressing   Problem: Activity: Goal: Risk for activity intolerance will decrease Outcome: Progressing   Problem: Coping: Goal: Level of anxiety will decrease Outcome: Progressing   

## 2021-02-12 NOTE — Progress Notes (Addendum)
Physical Therapy Treatment Patient Details Name: [name] [name] MRN: [id number] DOB: 1961-01-16 Today's Date: 02/12/2021    History of Present Illness 60 year old with no significant past medical history who presents complaining of nausea vomiting and vision changes and weakness.  She reports intermittent nausea and vomiting for the last 3 months.  Reports intermittent blurry vision. She reports worsening numbness and tingling in her hands and feet. She feels weak all over.  Pt found to be hypokalemic and with UTI. MRI revealed: 1. No acute infarct.  2. Ventriculomegaly, which may be ex vacuo in the setting of  moderate atrophy. Normal pressure hydrocephalus could have a similar  appearance in the correct clinical setting.  3. Mild to moderate T2/FLAIR hyperintensities within the white  matter. This finding is nonspecific but can be seen in the setting  of chronic microvascular ischemia, a demyelinating process such as multiple sclerosis, chronic migraines, or as the sequelae of a prior  infectious or inflammatory process.    PT Comments    Pt is NOT progressing with her mobility.  Present with symptoms of Korsakoff.  Both vision and motor control is worse.  General Gait Details: PROFOUND ATAXIA with inabliity to safely amb on her own.  Total GROSS motor impairment.  Pt required Max/Total Assist for trunk control/weightshift to prevent total LOB.  Had to assist pt "Pinocchio Style" to amb to bathroom.  HIGH FALL RISK. General transfer comment: gait is way worse....profound Gross Motor Ataxia with inability to control safely without assist.  Pt was UNABLE to rise from toilet.  Poor sitting balance control.  HIGH FALL RISK even just reaching for toilet paper.General bed mobility comments: pt self able to transition to EOB but present with profound gross motor ataxia and inability to prevent total "face plant" forward. Pt reports vision is worse esp in her right eye.  Follow Up Recommendations  SNF      Equipment Recommendations  None recommended by PT    Recommendations for Other Services       Precautions / Restrictions Precautions Precautions: Fall Precaution Comments: profound ataxia and decreased vision R>L    Mobility  Bed Mobility Overal bed mobility: Needs Assistance Bed Mobility: Supine to Sit;Sit to Supine     Supine to sit: Supervision Sit to supine: Supervision   General bed mobility comments: pt self able to transition to EOB but present with profound gross motor ataxia and inability to prevent total "face plant" forward.    Transfers Overall transfer level: Needs assistance Equipment used: Rolling walker (2 wheeled);None Transfers: Sit to/from [employer] Sit to Stand: Max assist;Total assist Stand pivot transfers: Total assist       General transfer comment: gait is way worse....profound Gross Motor Ataxia (similiar to [place]) with inability to control safely without assist.  Pt was UNABLE to rise from toilet.  Poor sitting balance control.  HIGH FALL RISK even just reaching for toilet paper.  Ambulation/Gait Ambulation/Gait assistance: Max assist;Total assist Gait Distance (Feet): 20 Feet (10 feet x 2 to and from bathroom) Assistive device: Rolling walker (2 wheeled) Gait Pattern/deviations: Step-to pattern;Decreased stride length;Narrow base of support Gait velocity: decreased   General Gait Details: PROFOUND ATAXIA with inabliity to safely amb on her own.  Total GROSS motor impairment.  Pt required Max/Total Assist for trunk control/weightshift to prevent total LOB.  Had to assist pt "Pinocchio Style" to amb to bathroom.  HIGH FALL RISK.   Stairs  Wheelchair Mobility    Modified Rankin (Stroke Patients Only)       Balance                                            Cognition Arousal/Alertness: Awake/alert   Overall Cognitive Status: Within Functional Limits for tasks  assessed                                 General Comments: A&Ox4 very pleasant lady but very worried and does not know what is going on with her medically "why can't I walk",  "why can't I see".  "It's getting worse"      Exercises      General Comments        Pertinent Vitals/Pain Pain Assessment: No/denies pain    Home Living                      Prior Function            PT Goals (current goals can now be found in the care plan section) Progress towards PT goals: Progressing toward goals    Frequency    Min 3X/week      PT Plan Current plan remains appropriate    Co-evaluation              AM-PAC PT "6 Clicks" Mobility   Outcome Measure  Help needed turning from your back to your side while in a flat bed without using bedrails?: A Lot Help needed moving from lying on your back to sitting on the side of a flat bed without using bedrails?: A Lot Help needed moving to and from a bed to a chair (including a wheelchair)?: Total Help needed standing up from a chair using your arms (e.g., wheelchair or bedside chair)?: Total Help needed to walk in hospital room?: Total Help needed climbing 3-5 steps with a railing? : A Little 6 Click Score: 10    End of Session Equipment Utilized During Treatment: Gait belt Activity Tolerance: Other (comment) (vision and gait is way worse) Patient left: in bed;with bed alarm set;with call bell/phone within reach Nurse Communication: Mobility status PT Visit Diagnosis: Unsteadiness on feet (R26.81);Other abnormalities of gait and mobility (R26.89);Difficulty in walking, not elsewhere classified (R26.2)     Time: 5102-5852 PT Time Calculation (min) (ACUTE ONLY): 26 min  Charges:  $Gait Training: 8-22 mins $Therapeutic Activity: 8-22 mins                     Felecia Shelling  PTA Acute  Rehabilitation Services Pager      [phone] Office      [phone]  Evaluating therapist reviewed and  agree with updated d/c plan to SNF.  Tori Collin Hendley PT, DPT 02/12/21, 1:00 PM

## 2021-02-13 ENCOUNTER — Inpatient Hospital Stay (HOSPITAL_COMMUNITY): Payer: BC Managed Care – PPO

## 2021-02-13 DIAGNOSIS — R531 Weakness: Secondary | ICD-10-CM

## 2021-02-13 LAB — BASIC METABOLIC PANEL
Anion gap: 8 (ref 5–15)
BUN: <5 mg/dL — ABNORMAL LOW (ref 6–20)
CO2: 24 mmol/L (ref 22–32)
Calcium: 8.4 mg/dL — ABNORMAL LOW (ref 8.9–10.3)
Chloride: 101 mmol/L (ref 98–111)
Creatinine, Ser: 0.52 mg/dL (ref 0.44–1.00)
GFR, Estimated: >60 mL/min (ref >60)
Glucose, Bld: 109 mg/dL — ABNORMAL HIGH (ref 70–99)
Potassium: 3 mmol/L — ABNORMAL LOW (ref 3.5–5.1)
Sodium: 133 mmol/L — ABNORMAL LOW (ref 135–145)

## 2021-02-13 LAB — CBC
HCT: 24.2 % — ABNORMAL LOW (ref 36.0–46.0)
Hemoglobin: 8.4 g/dL — ABNORMAL LOW (ref 12.0–15.0)
MCH: 35.6 pg — ABNORMAL HIGH (ref 26.0–34.0)
MCHC: 34.7 g/dL (ref 30.0–36.0)
MCV: 102.5 fL — ABNORMAL HIGH (ref 80.0–100.0)
Platelets: 32 10*3/uL — ABNORMAL LOW (ref 150–400)
RBC: 2.36 MIL/uL — ABNORMAL LOW (ref 3.87–5.11)
RDW: 16.2 % — ABNORMAL HIGH (ref 11.5–15.5)
WBC: 6.5 10*3/uL (ref 4.0–10.5)
nRBC: 0.3 % — ABNORMAL HIGH (ref 0.0–0.2)

## 2021-02-13 LAB — MAGNESIUM: Magnesium: 1.6 mg/dL — ABNORMAL LOW (ref 1.7–2.4)

## 2021-02-13 IMAGING — MR MR CERVICAL SPINE WO/W CM
3 series · 31 of 48 positions shown · IV contrast (gadavist)
Comparison: None.

CLINICAL DATA: Myelopathy

EXAM:
MRI CERVICAL SPINE WITHOUT AND WITH CONTRAST
TECHNIQUE: Multiplanar and multiecho pulse sequences of the cervical spine, to
include the craniocervical junction and cervicothoracic junction,
were obtained without and with intravenous contrast.
CONTRAST:  4mL GADAVIST GADOBUTROL 1 MMOL/ML IV SOLN

[Series 9: T2 post-contrast · sagittal · 3.0mm · 0.69mm/px · 11 of 15 slices shown]
[im 1/15]
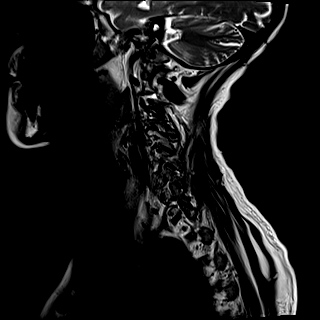
[im 2/15]
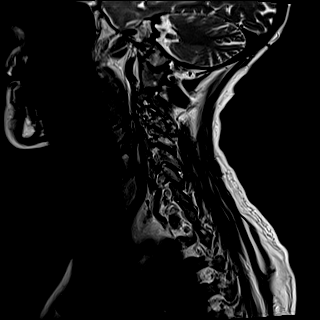
[im 3/15]
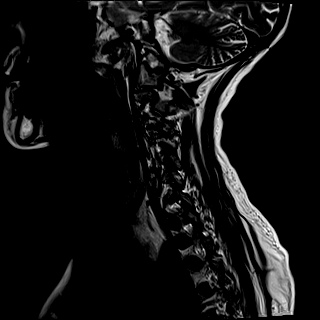
[im 4/15]
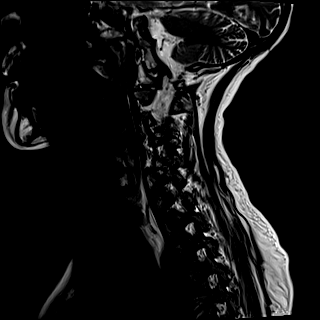
[im 6/15]
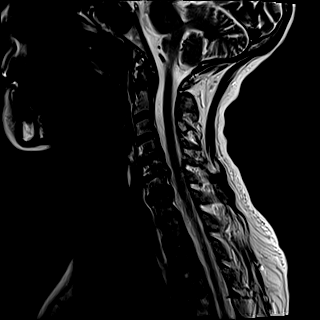
[im 7/15]
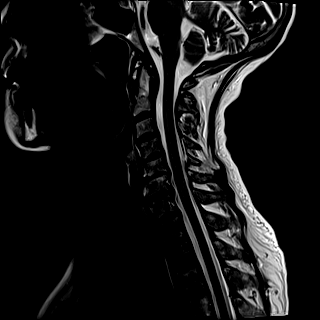
[im 8/15]
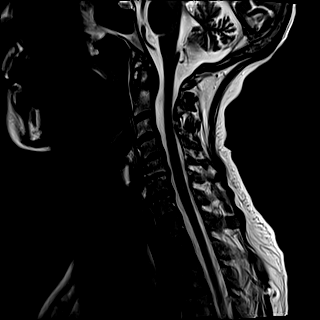
[im 11/15]
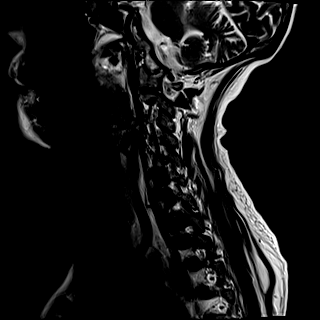
[im 12/15]
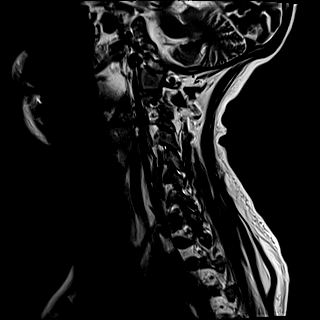
[im 13/15]
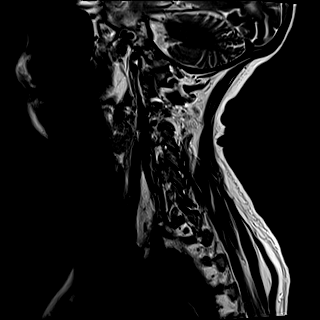
[im 15/15]
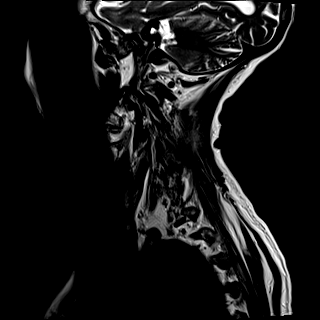

[Series 10: T1 fat-sat post-contrast · sagittal · 3.0mm · 0.69mm/px · 9 of 15 slices shown]
[im 1/15]
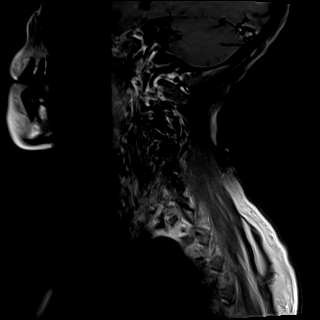
[im 3/15]
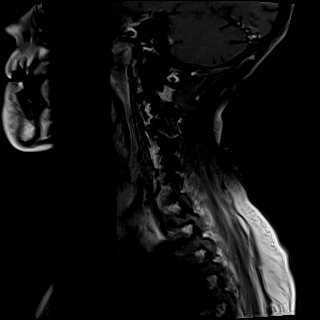
[im 5/15]
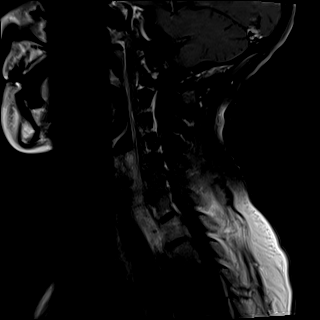
[im 6/15]
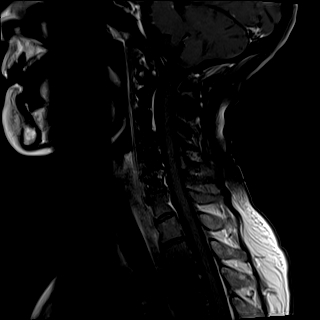
[im 8/15]
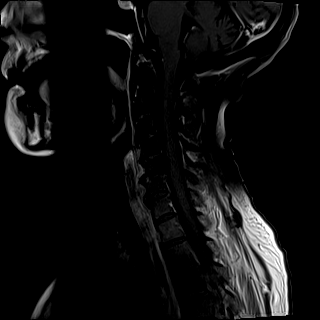
[im 9/15]
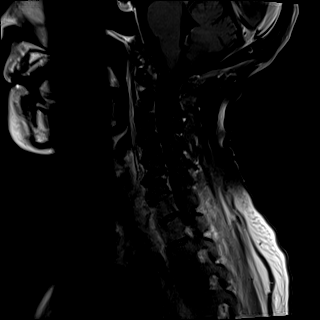
[im 10/15]
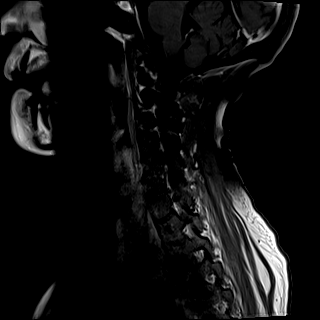
[im 12/15]
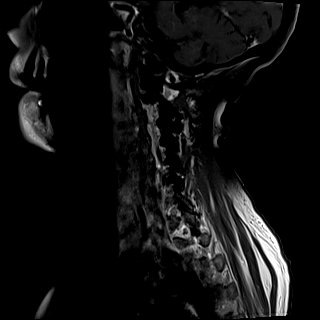
[im 15/15]
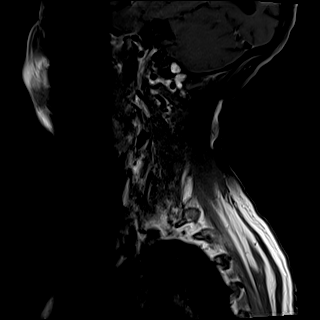

[Series 11: T1 post-contrast · axial · 3.0mm · 0.35mm/px · z∈[+71,+147]mm · 11 of 27 slices shown]
[im 2/27]
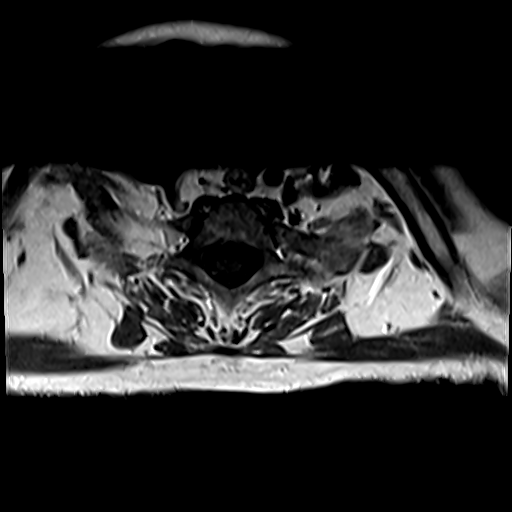
[im 4/27]
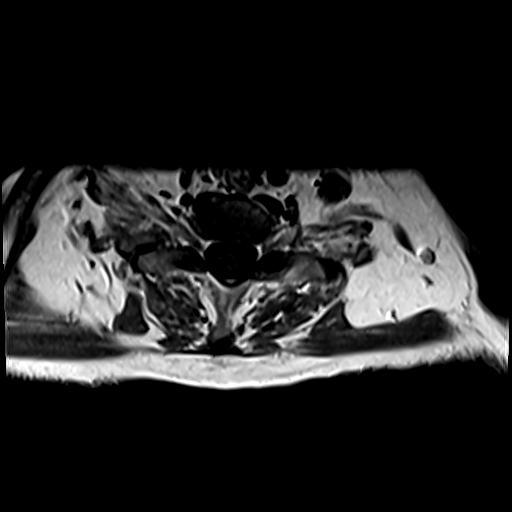
[im 5/27]
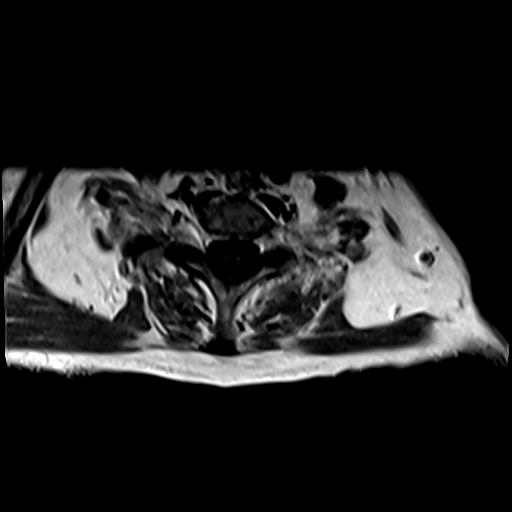
[im 9/27]
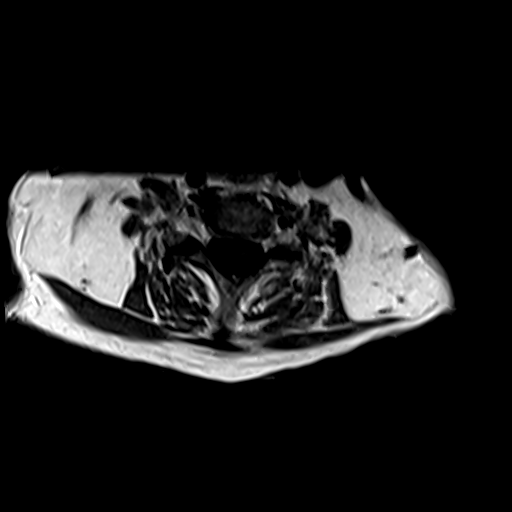
[im 12/27]
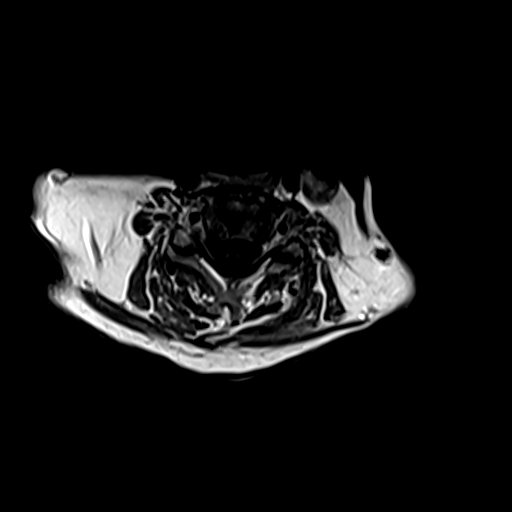
[im 14/27]
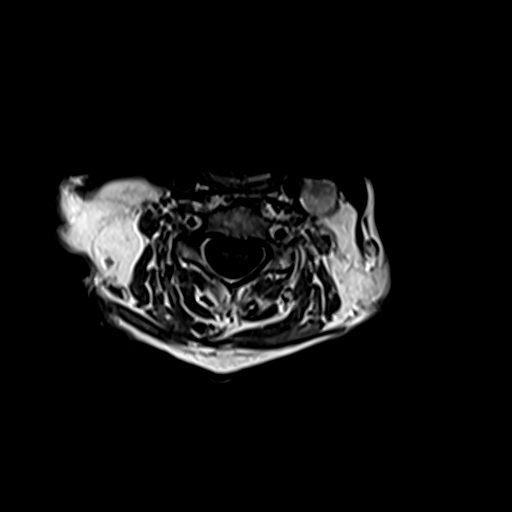
[im 15/27]
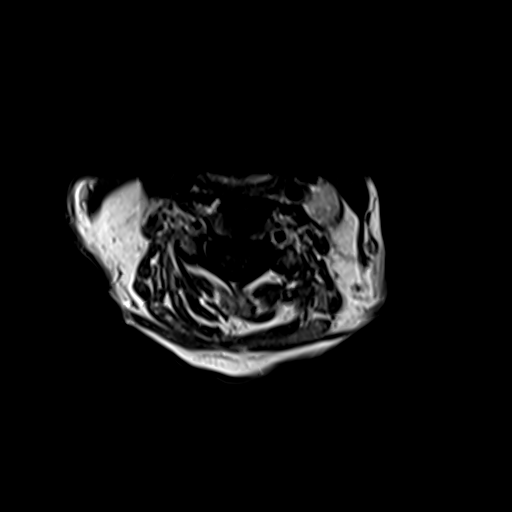
[im 18/27]
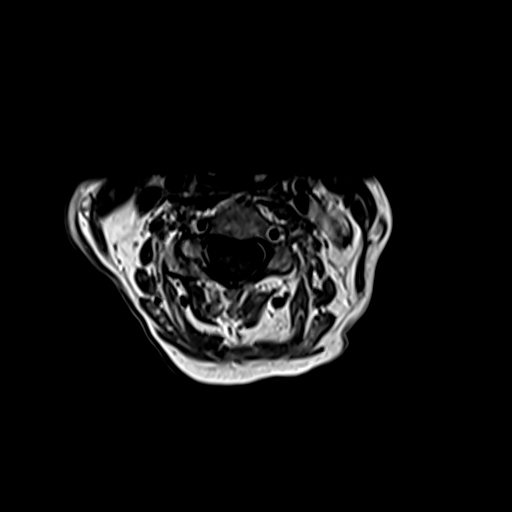
[im 22/27]
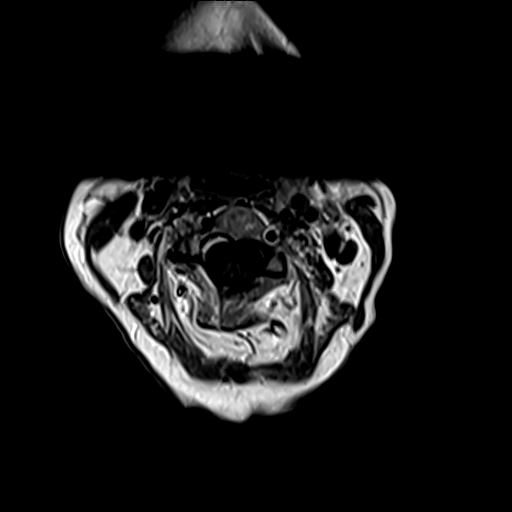
[im 23/27]
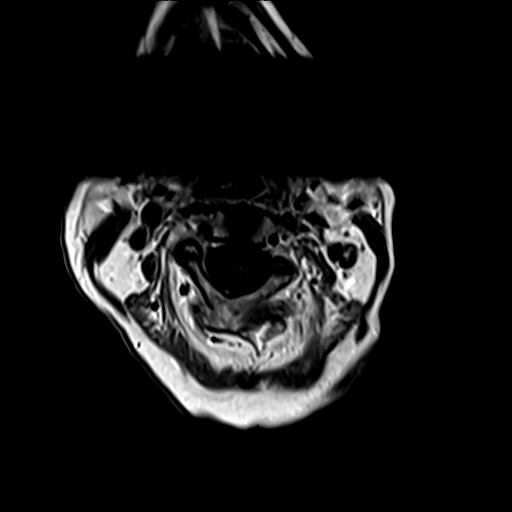
[im 25/27]
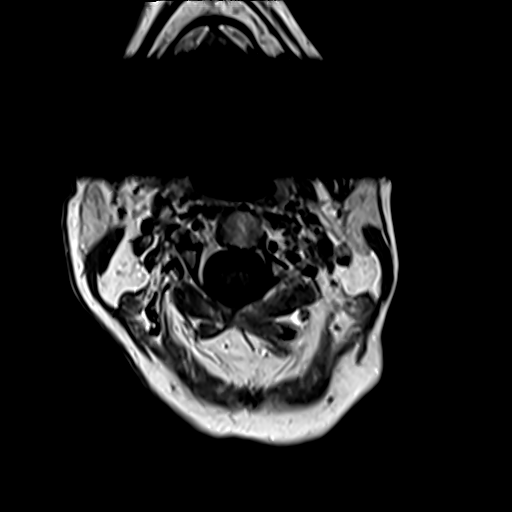

[31 of 48 positions shown; findings below may reference images not displayed]

FINDINGS: Alignment: Grade 1 retrolisthesis at C5-6

Vertebrae: No fracture, evidence of discitis, or bone lesion.

Cord: Normal signal and morphology. No abnormal contrast
enhancement.

Posterior Fossa, vertebral arteries, paraspinal tissues: Negative.

Disc levels:

C1-C2: Normal.

C2-C3: Normal disc space and facets. No spinal canal or
neuroforaminal stenosis.

C3-C4: Moderate right facet hypertrophy. No spinal canal or neural
foraminal stenosis.

C4-C5: Normal disc space and facets. No spinal canal or
neuroforaminal stenosis.

C5-C6: Small disc bulge with bilateral uncovertebral osteophytes.
Moderate right foraminal stenosis. No spinal canal stenosis.

C6-C7: Small central disc protrusion without spinal canal or neural
foraminal stenosis.

C7-T1: Normal disc space and facets. No spinal canal or
neuroforaminal stenosis.
IMPRESSION: 1. No spinal canal stenosis.
2. Moderate right C5-6 neural foraminal stenosis due to
uncovertebral osteophytes.
3. Small central disc protrusion at C6-C7 without spinal canal or
neural foraminal stenosis.

## 2021-02-13 IMAGING — MR MR CERVICAL SPINE WO/W CM
5 series · 33 of 48 positions shown · IV contrast (gadavist)
Comparison: None.

CLINICAL DATA: Myelopathy

EXAM:
MRI CERVICAL SPINE WITHOUT AND WITH CONTRAST
TECHNIQUE: Multiplanar and multiecho pulse sequences of the cervical spine, to
include the craniocervical junction and cervicothoracic junction,
were obtained without and with intravenous contrast.
CONTRAST:  4mL GADAVIST GADOBUTROL 1 MMOL/ML IV SOLN

[Series 5: T1 · sagittal · 3.0mm · 0.69mm/px · 6 of 15 slices shown (1 of 2)]
[im 1/15]
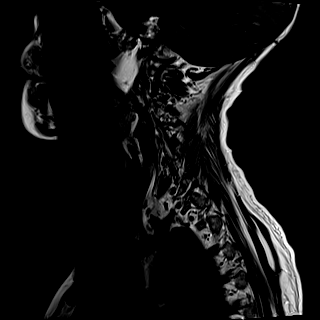
[im 3/15]
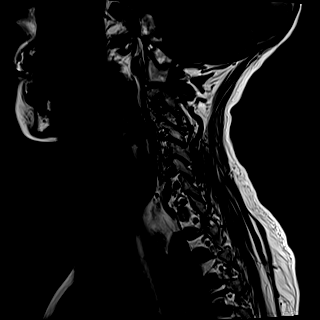
[im 6/15]
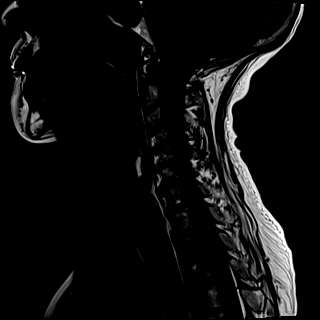
[im 9/15]
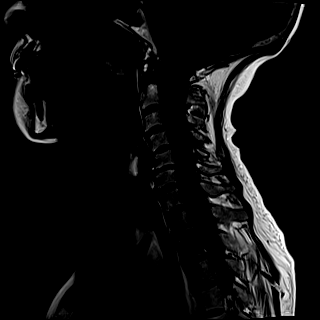
[im 12/15]
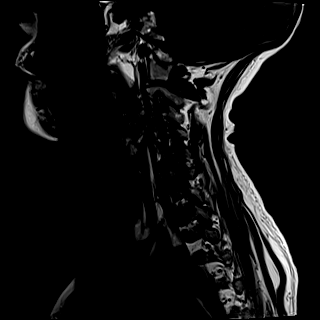
[im 15/15]
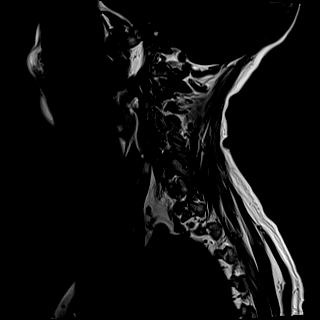

[Series 6: STIR · sagittal · 3.0mm · 0.86mm/px · 6 of 15 slices shown]
[im 1/15]
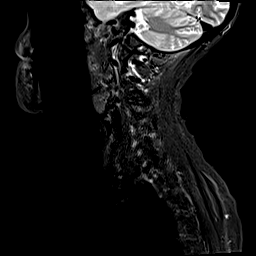
[im 3/15]
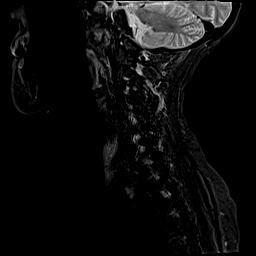
[im 6/15]
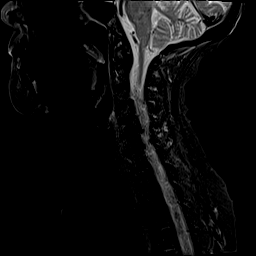
[im 9/15]
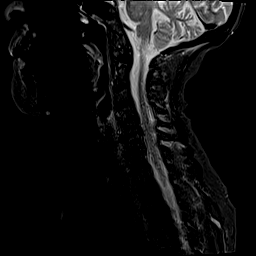
[im 12/15]
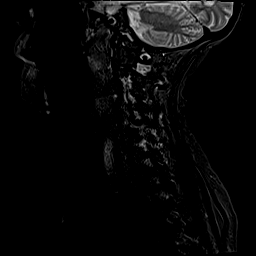
[im 15/15]
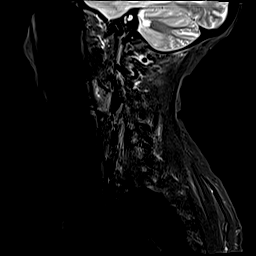

[Series 7: T2 · axial · 3.0mm · 0.70mm/px · z∈[-80,+4]mm · 9 of 27 slices shown]
[im 1/27]
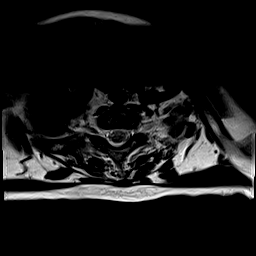
[im 5/27]
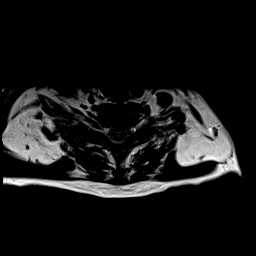
[im 8/27]
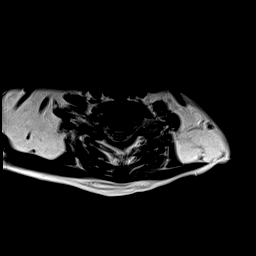
[im 12/27]
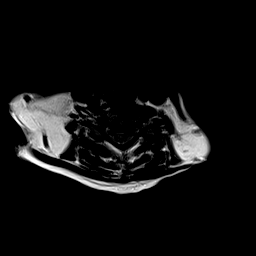
[im 15/27]
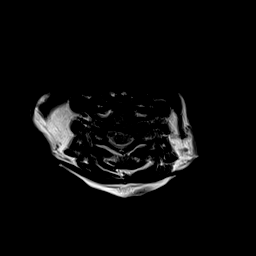
[im 19/27]
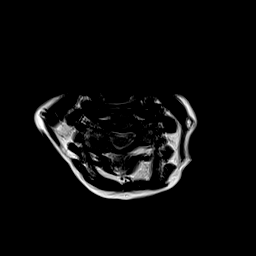
[im 22/27]
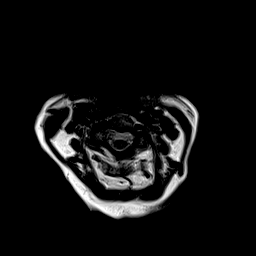
[im 24/27]
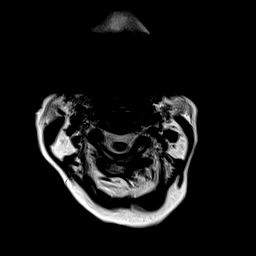
[im 27/27]
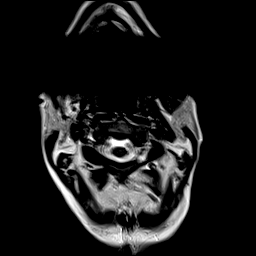

[Series 8: GRE · axial · 3.0mm · 0.39mm/px · z∈[-85,-62]mm · 3 of 27 slices shown]
[im 1/27]
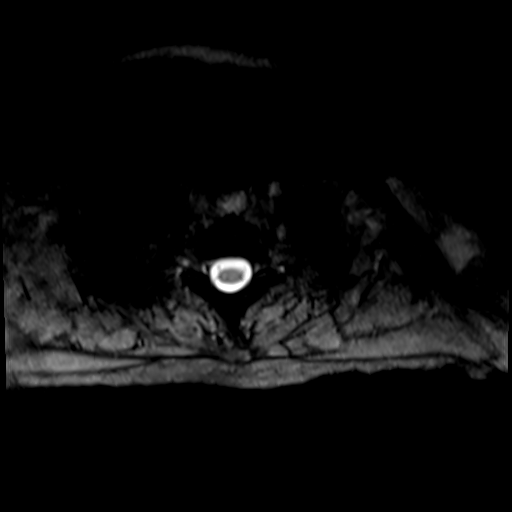
[im 5/27]
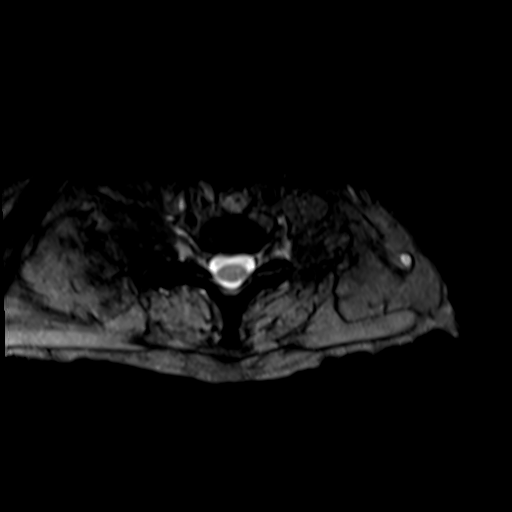
[im 8/27]
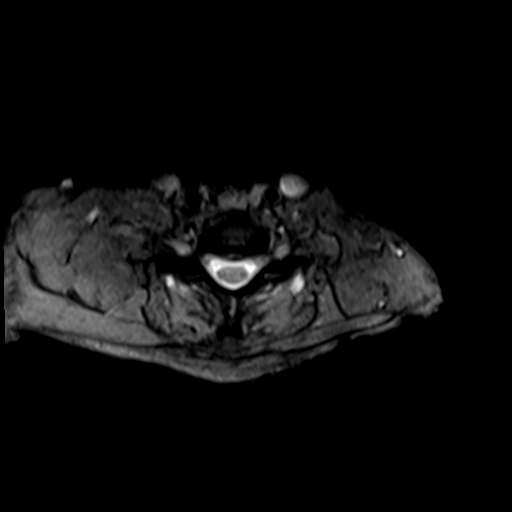

[Series 9: T1 · axial · 3.0mm · 0.39mm/px · z∈[-85,+0]mm · 9 of 27 slices shown (2 of 2)]
[im 1/27]
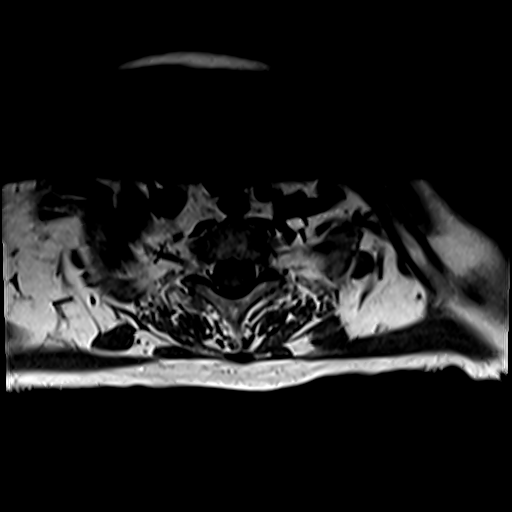
[im 5/27]
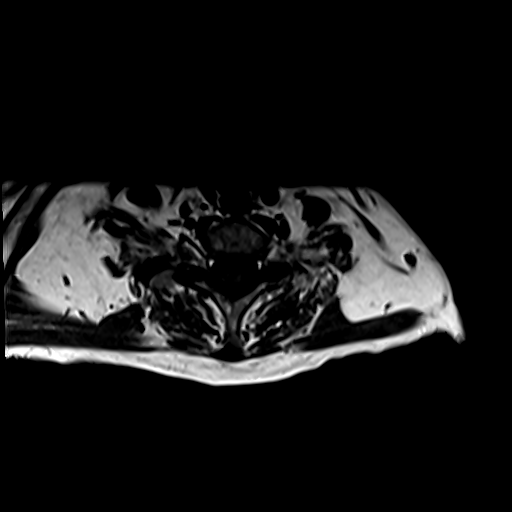
[im 8/27]
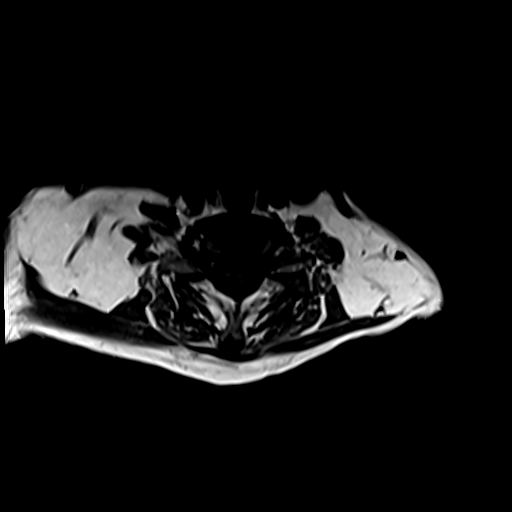
[im 12/27]
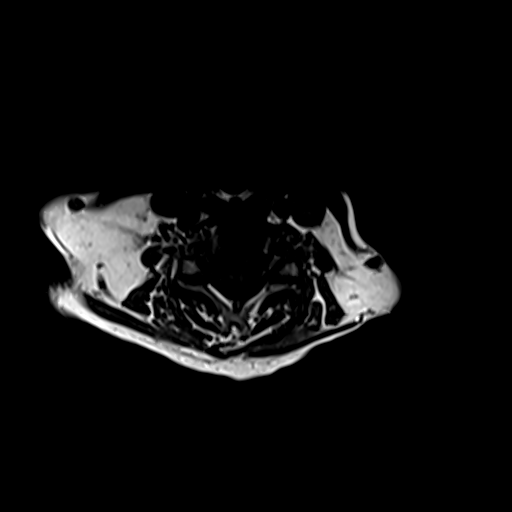
[im 15/27]
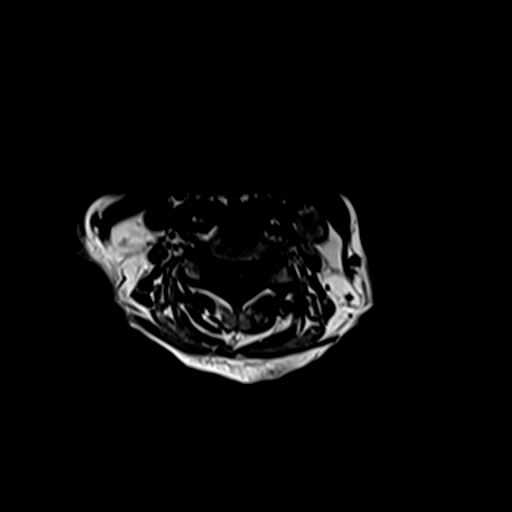
[im 19/27]
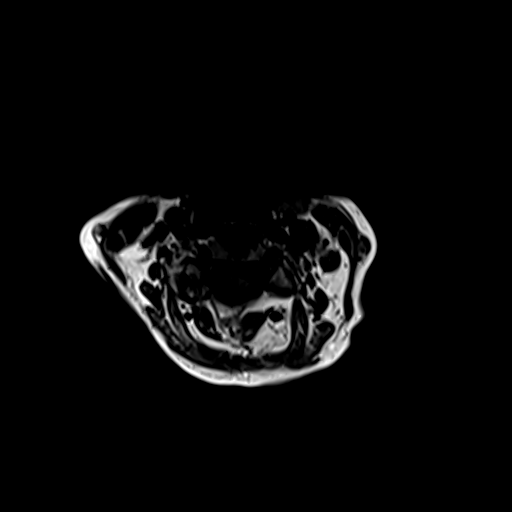
[im 22/27]
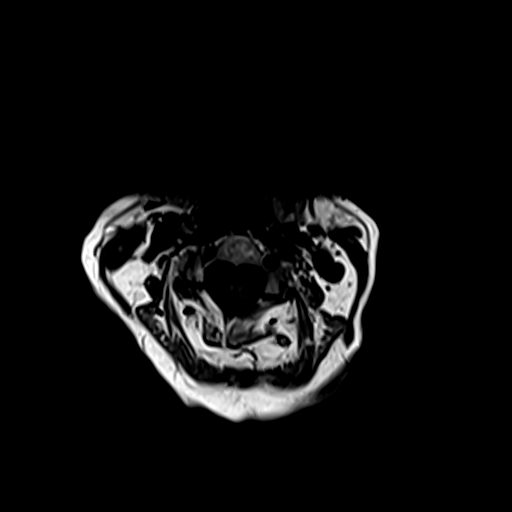
[im 24/27]
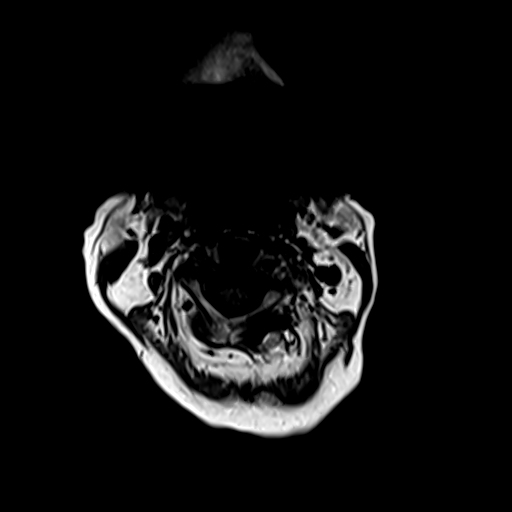
[im 27/27]
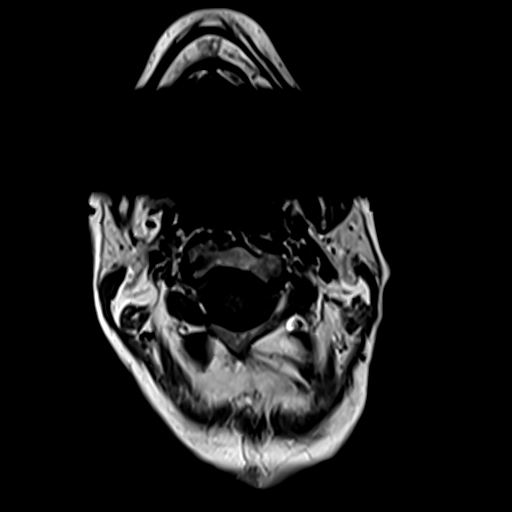

[33 of 48 positions shown; findings below may reference images not displayed]

FINDINGS: Alignment: Grade 1 retrolisthesis at C5-6

Vertebrae: No fracture, evidence of discitis, or bone lesion.

Cord: Normal signal and morphology. No abnormal contrast
enhancement.

Posterior Fossa, vertebral arteries, paraspinal tissues: Negative.

Disc levels:

C1-C2: Normal.

C2-C3: Normal disc space and facets. No spinal canal or
neuroforaminal stenosis.

C3-C4: Moderate right facet hypertrophy. No spinal canal or neural
foraminal stenosis.

C4-C5: Normal disc space and facets. No spinal canal or
neuroforaminal stenosis.

C5-C6: Small disc bulge with bilateral uncovertebral osteophytes.
Moderate right foraminal stenosis. No spinal canal stenosis.

C6-C7: Small central disc protrusion without spinal canal or neural
foraminal stenosis.

C7-T1: Normal disc space and facets. No spinal canal or
neuroforaminal stenosis.
IMPRESSION: 1. No spinal canal stenosis.
2. Moderate right C5-6 neural foraminal stenosis due to
uncovertebral osteophytes.
3. Small central disc protrusion at C6-C7 without spinal canal or
neural foraminal stenosis.

## 2021-02-13 IMAGING — MR MR THORACIC SPINE WO/W CM
8 of 9 series · 34 of 48 positions shown · IV contrast (4 ML GADAVIST)
Comparison: None.

CLINICAL DATA: Myelopathy

EXAM:
MRI THORACIC WITHOUT AND WITH CONTRAST
TECHNIQUE: Multiplanar and multiecho pulse sequences of the thoracic spine were
obtained without and with intravenous contrast.
CONTRAST:  4mL GADAVIST GADOBUTROL 1 MMOL/ML IV SOLN

[Series 16: T1 · sagittal · 4.0mm · 1.72mm/px · 2 of 5 slices shown (1 of 3)]
[im 1/5]
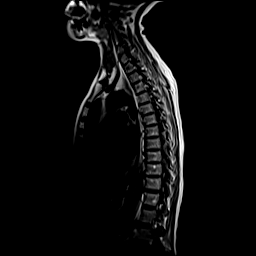
[im 5/5]
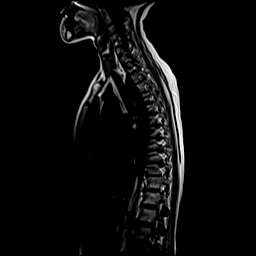

[Series 17: STIR · sagittal · 3.0mm · 1.00mm/px · 4 of 15 slices shown]
[im 1/15]
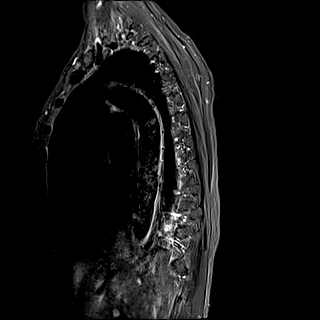
[im 5/15]
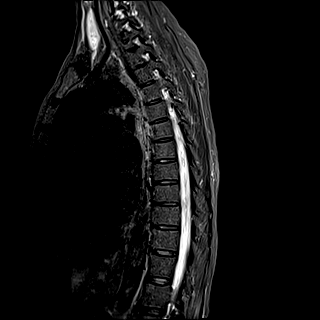
[im 10/15]
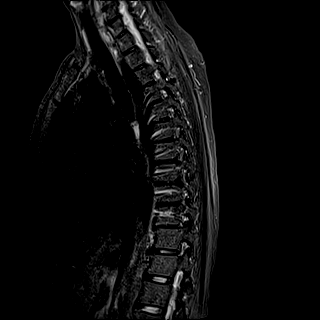
[im 15/15]
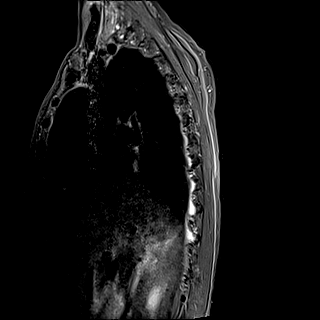

[Series 18: T1 · sagittal · 3.0mm · 1.00mm/px · 4 of 15 slices shown (2 of 3)]
[im 1/15]
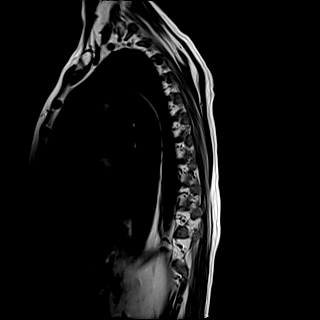
[im 5/15]
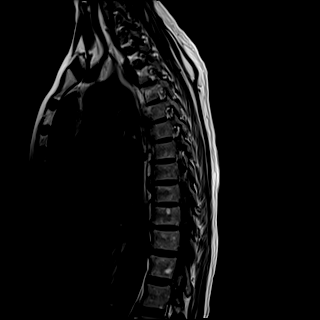
[im 10/15]
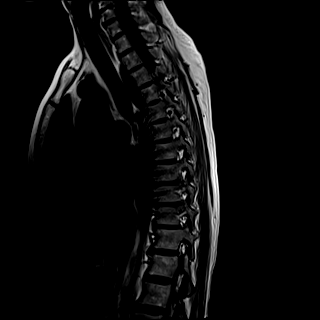
[im 15/15]
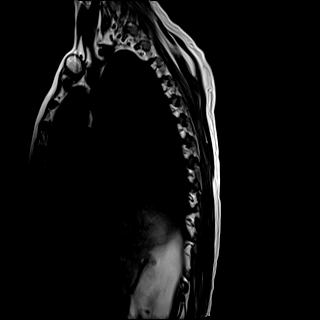

[Series 19: T2 · axial · 4.0mm · 0.78mm/px · z∈[-261,-70]mm · 8 of 39 slices shown]
[im 1/39]
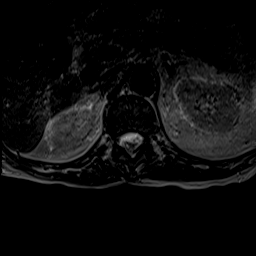
[im 6/39]
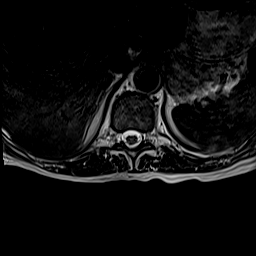
[im 11/39]
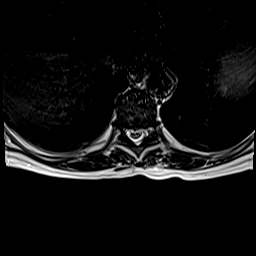
[im 17/39]
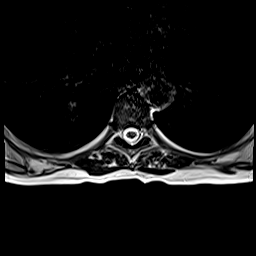
[im 22/39]
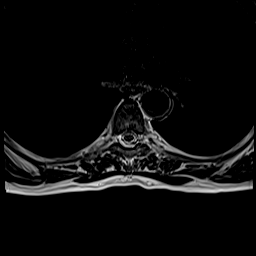
[im 28/39]
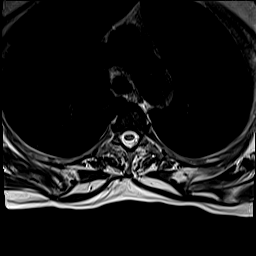
[im 33/39]
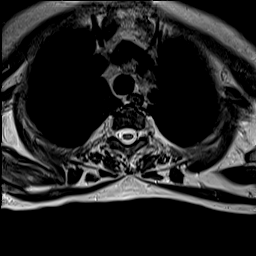
[im 39/39]
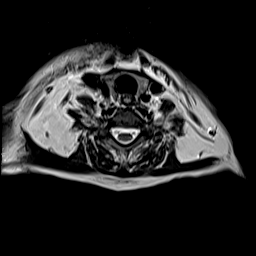

[Series 21: T1 · axial · 4.0mm · 0.43mm/px · z∈[-259,-74]mm · 8 of 39 slices shown (3 of 3)]
[im 1/39]
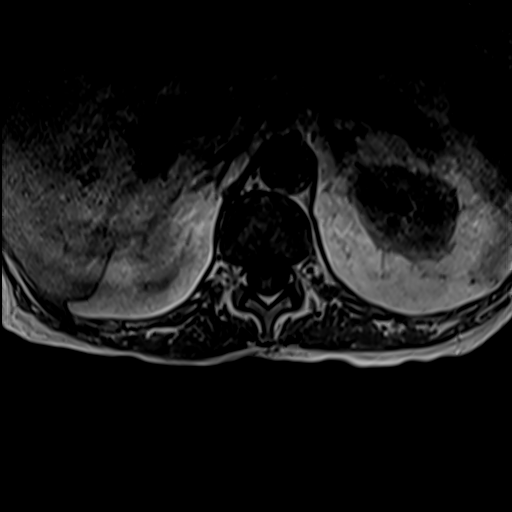
[im 6/39]
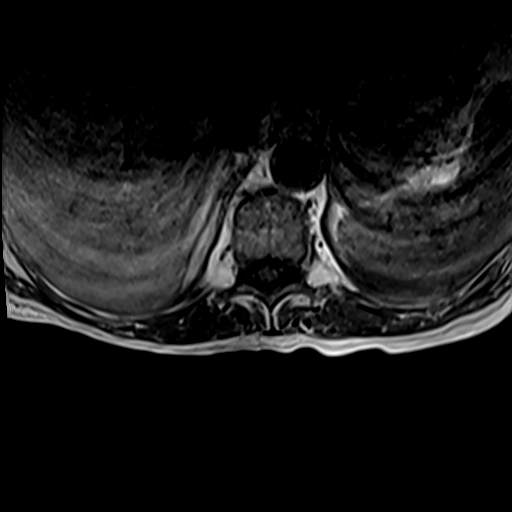
[im 11/39]
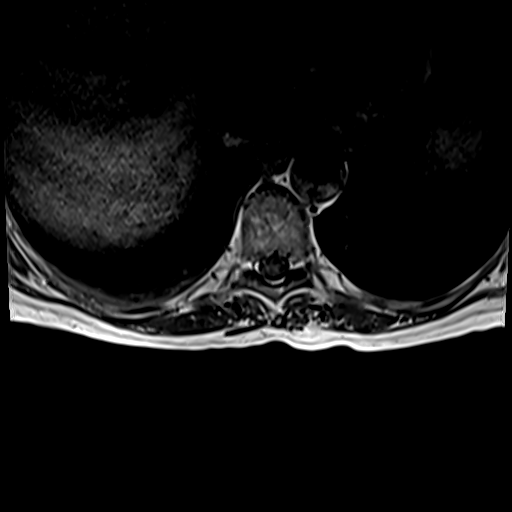
[im 17/39]
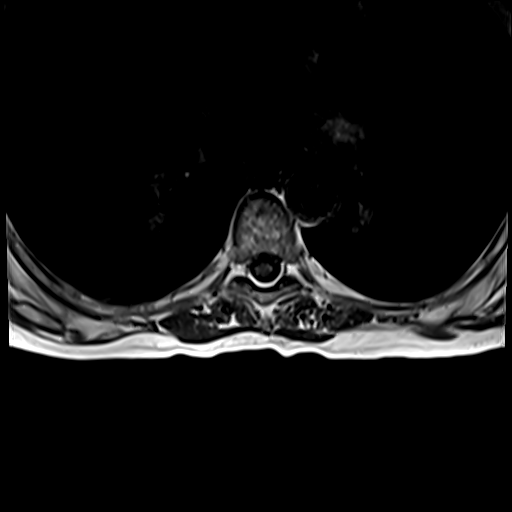
[im 22/39]
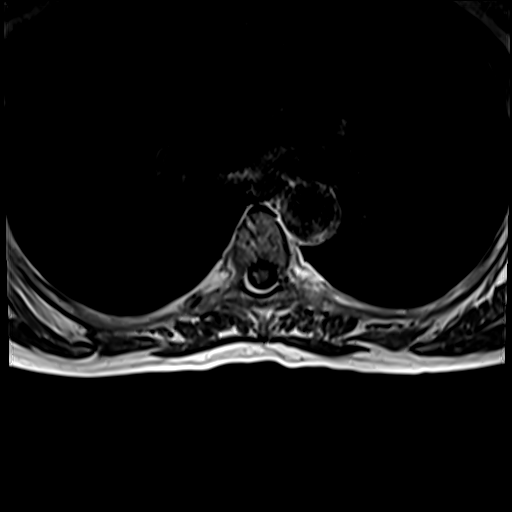
[im 28/39]
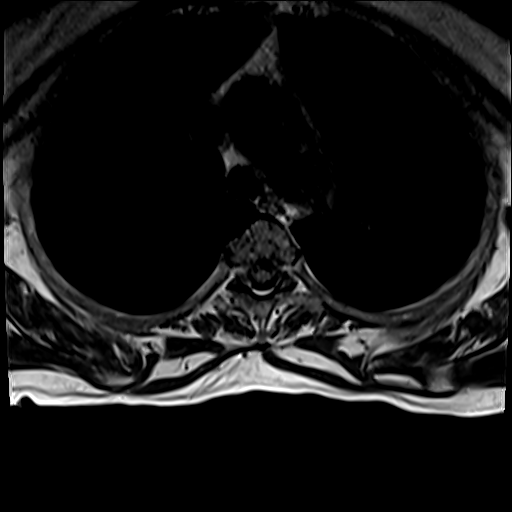
[im 33/39]
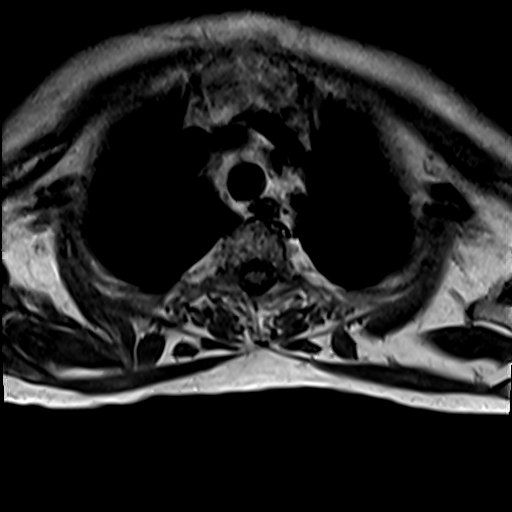
[im 39/39]
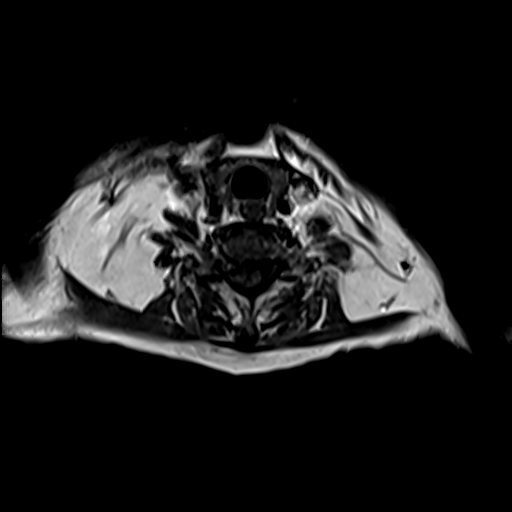

[Series 22: T2 post-contrast · sagittal · 3.0mm · 0.83mm/px · 3 of 15 slices shown]
[im 1/15]
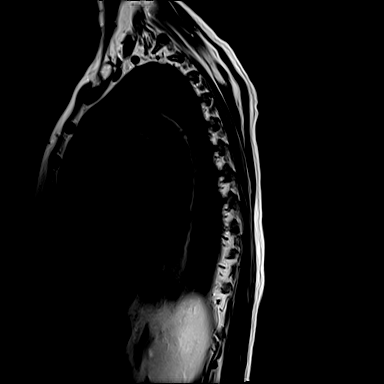
[im 8/15]
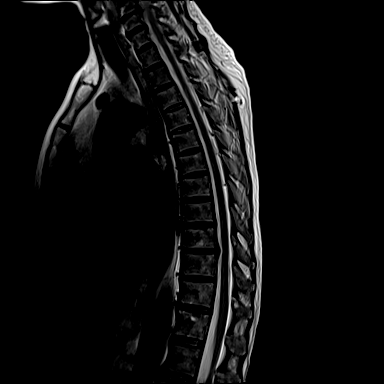
[im 15/15]
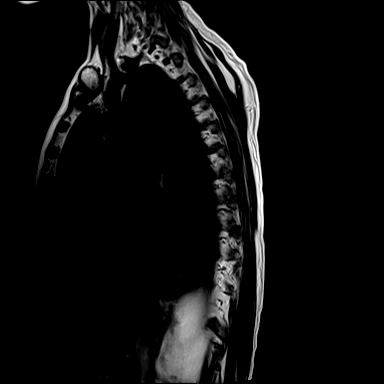

[Series 23: T1 fat-sat post-contrast · sagittal · 3.0mm · 1.00mm/px · 3 of 15 slices shown]
[im 1/15]
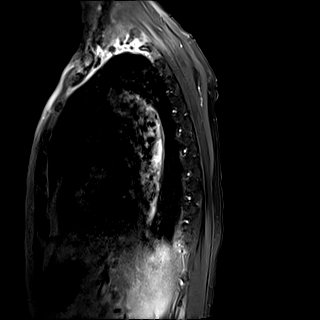
[im 8/15]
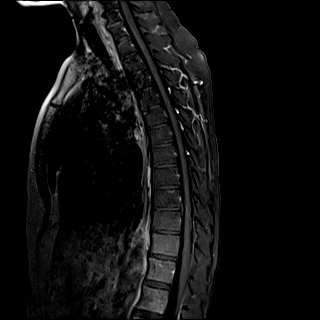
[im 15/15]
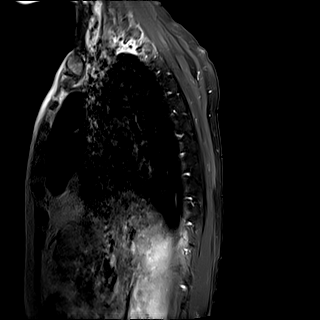

[Series 24: T1 post-contrast · axial · 4.0mm · 0.43mm/px · z∈[-259,-224]mm · 2 of 39 slices shown]
[im 1/39]
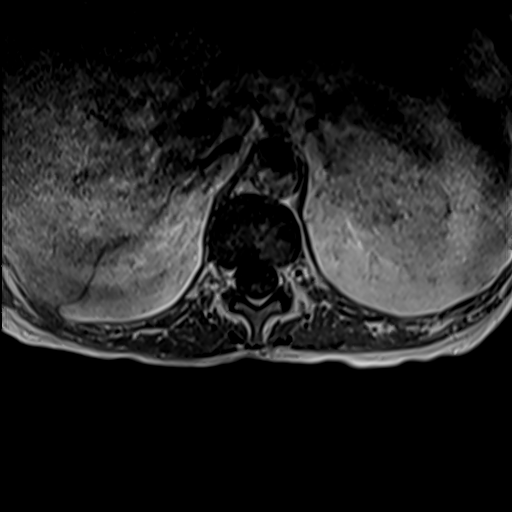
[im 6/39]
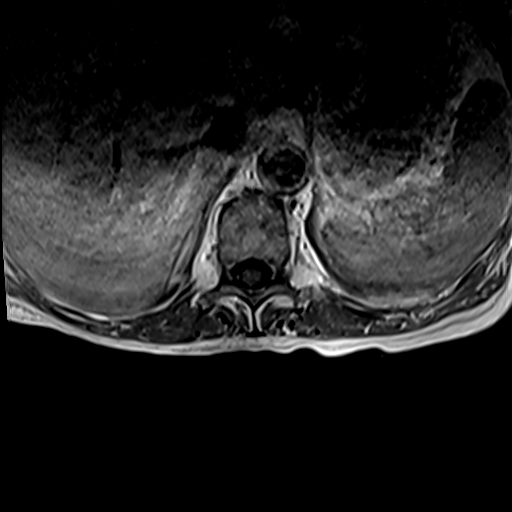

[34 of 48 positions shown; findings below may reference images not displayed]

FINDINGS: Alignment:  Physiologic.

Vertebrae: No fracture, evidence of discitis, or bone lesion.

Cord: Normal signal and morphology. No abnormal contrast enhancement

Paraspinal and other soft tissues: Negative

Disc levels:

There is a small central disc protrusion at T10-11 without spinal
canal or neural foraminal stenosis. The other disc levels are
normal.
IMPRESSION: 1. No thoracic spinal canal or neural foraminal stenosis.
2. Small central disc protrusion at T10-11 without spinal canal or
neural foraminal stenosis.

## 2021-02-13 MED ORDER — POTASSIUM CHLORIDE CRYS ER 20 MEQ PO TBCR
40.0000 meq | EXTENDED_RELEASE_TABLET | ORAL | Status: AC
  Administered 2021-02-13 (×2): 40 meq via ORAL
  Filled 2021-02-13 (×2): qty 2

## 2021-02-13 MED ORDER — GADOBUTROL 1 MMOL/ML IV SOLN
4.0000 mL | Freq: Once | INTRAVENOUS | Status: AC | PRN
  Administered 2021-02-13: 4 mL via INTRAVENOUS

## 2021-02-13 MED ORDER — VITAMIN B-12 1000 MCG PO TABS
2000.0000 ug | ORAL_TABLET | Freq: Every day | ORAL | Status: DC
  Administered 2021-02-14 – 2021-02-27 (×14): 2000 ug via ORAL
  Filled 2021-02-13 (×14): qty 2

## 2021-02-13 MED ORDER — SUCRALFATE 1 GM/10ML PO SUSP
1.0000 g | Freq: Three times a day (TID) | ORAL | Status: DC
  Administered 2021-02-13 – 2021-02-27 (×51): 1 g via ORAL
  Filled 2021-02-13 (×52): qty 10

## 2021-02-13 MED ORDER — SODIUM CHLORIDE 0.9 % IV BOLUS
500.0000 mL | Freq: Once | INTRAVENOUS | Status: AC
  Administered 2021-02-13: 500 mL via INTRAVENOUS

## 2021-02-13 MED ORDER — SODIUM CHLORIDE 0.9 % IV SOLN: INTRAVENOUS | Status: DC

## 2021-02-13 MED ORDER — POTASSIUM CHLORIDE CRYS ER 20 MEQ PO TBCR: 20.0000 meq | EXTENDED_RELEASE_TABLET | Freq: Every day | ORAL | Status: DC

## 2021-02-13 MED ORDER — MAGNESIUM SULFATE 2 GM/50ML IV SOLN
2.0000 g | Freq: Once | INTRAVENOUS | Status: AC
  Administered 2021-02-13: 2 g via INTRAVENOUS
  Filled 2021-02-13: qty 50

## 2021-02-13 MED ORDER — POTASSIUM CHLORIDE 20 MEQ PO PACK
20.0000 meq | PACK | Freq: Every day | ORAL | Status: DC
  Administered 2021-02-14 – 2021-02-15 (×2): 20 meq via ORAL
  Filled 2021-02-13 (×2): qty 1

## 2021-02-13 NOTE — TOC Progression Note (Signed)
Transition of Care [facility]) - Progression Note    Patient Details  Name: [name] [name] MRN: [id number] Date of Birth: 1961-04-19  [facility] CM/SW Contact  Geni Bers, RN Phone Number: 02/13/2021, 11:06 AM  Clinical Narrative:     Pt will discharge home with [facility] for Home Health PT. Referral given to in house rep.         Expected Discharge Plan and Services                                                 Social Determinants of Health (SDOH) Interventions    Readmission Risk Interventions No flowsheet data found.

## 2021-02-13 NOTE — Progress Notes (Signed)
   02/13/21 1000  What Happened  Was fall witnessed? Yes  Who witnessed fall? Olivia Lanzillotti  Patients activity before fall to/from bed, chair, or stretcher  Point of contact other (comment) (knees)  Was patient injured? No  Follow Up  MD notified regaladol  Time MD notified 53  Family notified No - patient refusal  Additional tests No  Progress note created (see row info) Yes  Adult Fall Risk Assessment  Risk Factor Category (scoring not indicated) Fall has occurred during this admission (document High fall risk)  Patient Fall Risk Level High fall risk  Adult Fall Risk Interventions  Required Bundle Interventions *See Row Information* High fall risk - low, moderate, and high requirements implemented  Additional Interventions PT/OT need assessed if change in mobility from baseline;Use of appropriate toileting equipment (bedpan, BSC, etc.)  Screening for Fall Injury Risk (To be completed on HIGH fall risk patients) - Assessing Need for Floor Mats  Risk For Fall Injury- Criteria for Floor Mats Previous fall this admission  Will Implement Floor Mats Yes  Vitals  Temp 98.1 F (36.7 C)  Temp Source Oral  BP 129/84  BP Location Left Arm  BP Method Automatic  Patient Position (if appropriate) Lying  Pulse Rate (!) 134  Pulse Rate Source Dinamap  Resp 20  Oxygen Therapy  SpO2 99 %  O2 Device Room Air  PCA/Epidural/Spinal Assessment  Respiratory Pattern Irregular  Neurological  Neuro (WDL) X  Level of Consciousness Alert  Orientation Level Oriented X4  Cognition Appropriate at baseline  Speech Clear  Musculoskeletal  Musculoskeletal (WDL) X  Assistive Device BSC  Generalized Weakness Yes  Weight Bearing Restrictions No  Musculoskeletal Details  RLE Weakness  LLE Weakness

## 2021-02-13 NOTE — Consult Note (Addendum)
Neurology Consultation  Reason for Consult: Bilateral lower extremity weakness  Referring Physician: Dr. Sunnie Nielsen  CC: Bilateral lower extremity weakness  History is obtained from: Chart review, Patient  HPI: [name] [name] is a 60 y.o. female with no significant past medical history who presented to the ED 6/14 for evaluation of bilateral lower extremity numbness, tingling, and weakness with gait imbalance and blurry vision that started approximately 2 weeks prior to hospital arrival. She states that she initially noticed numbness and tingling in her feet, then she noticed the numbness and tingling in her hands before the lower extremity sensation changes progressed to involve her entire bilateral lower extremities. She states that she fell at home once on the day that she arrived at the hospital but denies dizziness or lightheadedness with standing or loss of consciousness. She also endorses vomiting at home that she says started on Easter and occurs every morning when she wakes up but endorses that it occurs once daily and she is still able to tolerate PO intake with some diversion due to nausea. She states that the sensory changes are constant and that her legs "feel like plastic" and "when I touch them I feel like I am touching a doll's leg". She endorses recent weight loss but is unable to quantify her weight loss because she has not weighed herself. She also endorses blurry vision that started approximately 8 days ago. She states that she went to watch television and could not see the channels like she could before.   Inpatient workup revealed patient to be hypokalemic, hyponatremic, thrombocytopenic, anemic, and with a UA suggestive of a UTI. She was placed on antibiotics. When asked about her history of alcohol use she states she last drank wine before Easter and would only have 2 - 3 glasses per week and considers herself a social drinker but denies heavy alcohol use.  ROS: Unable to obtain  due to altered mental status.   History reviewed. No pertinent past medical history.  History reviewed. No pertinent family history.  Social History:   reports that she has never smoked. She has never used smokeless tobacco. She reports current alcohol use. She reports that she does not use drugs.  Medications  Current Facility-Administered Medications:    0.9 %  sodium chloride infusion, , Intravenous, Continuous, Regalado, Belkys A, MD, Last Rate: 75 mL/hr at 02/13/21 1251, Rate Change at 02/13/21 1251   acetaminophen (TYLENOL) tablet 650 mg, 650 mg, Oral, Q6H PRN, 650 mg at 02/11/21 0658 **OR** acetaminophen (TYLENOL) suppository 650 mg, 650 mg, Rectal, Q6H PRN, Ronaldo Miyamoto, Tyrone A, DO   artificial tears (LACRILUBE) ophthalmic ointment, , Both Eyes, Q4H PRN, Regalado, Belkys A, MD   calcium carbonate (OS-CAL - dosed in mg of elemental calcium) tablet 500 mg of elemental calcium, 1 tablet, Oral, BID WC, Regalado, Belkys A, MD, 500 mg of elemental calcium at 02/13/21 0745   folic acid (FOLVITE) tablet 1 mg, 1 mg, Oral, Daily, Earl Many M, RPH, 1 mg at 02/13/21 4540   hydrALAZINE (APRESOLINE) tablet 25 mg, 25 mg, Oral, Q8H PRN, Regalado, Belkys A, MD   LORazepam (ATIVAN) tablet 0.5 mg, 0.5 mg, Oral, Q6H PRN, Ronaldo Miyamoto, Tyrone A, DO, 0.5 mg at 02/13/21 0014   pantoprazole (PROTONIX) EC tablet 40 mg, 40 mg, Oral, BID, Earl Many M, RPH, 40 mg at 02/13/21 0908   potassium chloride SA (KLOR-CON) CR tablet 40 mEq, 40 mEq, Oral, Q4H, Regalado, Belkys A, MD, 40 mEq at 02/13/21 0908   sucralfate (  CARAFATE) tablet 1 g, 1 g, Oral, TID WC & HS, Regalado, Belkys A, MD, 1 g at 02/13/21 0745   traMADol (ULTRAM) tablet 50 mg, 50 mg, Oral, Q6H PRN, Regalado, Belkys A, MD, 50 mg at 02/13/21 0907   vitamin B-12 (CYANOCOBALAMIN) tablet 500 mcg, 500 mcg, Oral, Daily, Regalado, Belkys A, MD, 500 mcg at 02/13/21 0908  Exam: Current vital signs: BP (!) 134/97 (BP Location: Right Arm)   Pulse (!) 103   Temp 98.4  F (36.9 C) (Oral)   Resp 20   Ht 5\' 1"  (1.549 m)   Wt 49.9 kg   LMP  (LMP Unknown) Comment: Last menstrual period was 8-9 years ago  SpO2 99%   BMI 20.78 kg/m  Vital signs in last 24 hours: Temp:  [97.4 F (36.3 C)-98.4 F (36.9 C)] 98.4 F (36.9 C) (06/18 1229) Pulse Rate:  [77-134] 103 (06/18 1229) Resp:  [18-20] 20 (06/18 1000) BP: (111-136)/(72-97) 134/97 (06/18 1229) SpO2:  [96 %-100 %] 99 % (06/18 1229)  GENERAL: Thin woman sitting up in bed. Awake, alert, in no acute distress Psych: Affect appropriate for situation, calm and cooperative with examination Head: Normocephalic and atraumatic without obvious abnromality EENT: Normal conjunctivae, no OP obstruction, dry mucous membranes LUNGS - Normal respiratory effort, non-labored breathing, on room air CV: Tachycardic on cardiac monitor, no pedal edema noted ABDOMEN - Soft, non-distended Ext: warm, well perfused. Ecchymosis present on the right fourth digit due to injury from a fall.  NEURO:  Mental Status: Awake, alert, and oriented to person, place, time, and situation. She is able to provide a clear and coherent history of present illness.  Speech is intact without dysarthria or aphasia. Naming, repetition, fluency, and comprehension intact. No neglect is noted.  Cranial Nerves:  II: PERRL 4 mm/brisk. Visual fields full.  III, IV, VI: EOMI without ptosis V: Sensation is intact to light touch and symmetrical to face.  VII: Face is symmetric resting and smiling.  VIII: Hearing is intact to voice IX, X: Palate elevation is symmetric. Phonation normal.  XI: Normal sternocleidomastoid and trapezius muscle strength XII: Tongue protrudes midline without fasciculations.   Motor: 4-/5 strength in bilateral deltoids, biceps, triceps, grip strength, finger extension and finger flexion. RUE able to sustain antigravity movement without vertical drift. LUE with minimal pronator drift on assessment. BUE with decreased tone and  muscle bulk diffusely.  Right lower extremity able to withstand antigravity movement without vertical drift, left lower extremity with minimal drift on assessment. Bilateral lower extremities with 4-/5 plantar and dorsiflexion (can be overcome by examiner), knee flexion and extension 4-/5, and 4/-5 hip flexion without noticeable asymmetry. Tone is decreased in BLE; bulk is also decreased diffusely in all muscle groups  Sensation: Intact to light touch throughout with numbness and tingling in bilateral lower extremities, decreased sensation to light touch in the left arm. Coordination: FTN intact bilaterally. HKS with ataxia noted bilaterally, right minimal ataxia and worse on the left lower extremity.  DTRs: 0 bilateral brachioradialis, biceps, patellae and achilles reflexes. Unable to elicit reflexes throughout. She has myotactic responses which are not true reflexes.  Plantar: Toes downgoing on the right and upgoing on the left.  Gait: Patient unable to bear weight on standing, collapsed with attempt to stand, further assessment of standing/gait deferred.   Labs I have reviewed labs in epic and the results pertinent to this consultation are: CBC    Component Value Date/Time   WBC 6.5 02/13/2021 0437   RBC 2.36 (  L) 02/13/2021 0437   HGB 8.4 (L) 02/13/2021 0437   HCT 24.2 (L) 02/13/2021 0437   PLT 32 (L) 02/13/2021 0437   MCV 102.5 (H) 02/13/2021 0437   MCH 35.6 (H) 02/13/2021 0437   MCHC 34.7 02/13/2021 0437   RDW 16.2 (H) 02/13/2021 0437   LYMPHSABS 1.0 02/08/2021 1253   MONOABS 1.1 (H) 02/08/2021 1253   EOSABS 0.0 02/08/2021 1253   BASOSABS 0.1 02/08/2021 1253   CMP     Component Value Date/Time   NA 133 (L) 02/13/2021 0437   K 3.0 (L) 02/13/2021 0437   CL 101 02/13/2021 0437   CO2 24 02/13/2021 0437   GLUCOSE 109 (H) 02/13/2021 0437   BUN <5 (L) 02/13/2021 0437   CREATININE 0.52 02/13/2021 0437   CALCIUM 8.4 (L) 02/13/2021 0437   PROT 6.2 (L) 02/09/2021 0334   ALBUMIN 3.0  (L) 02/09/2021 0334   AST 50 (H) 02/09/2021 0334   ALT 28 02/09/2021 0334   ALKPHOS 69 02/09/2021 0334   BILITOT 0.8 02/09/2021 0334   GFRNONAA >60 02/13/2021 0437   Lipid Panel  No results found for: CHOL, TRIG, HDL, CHOLHDL, VLDL, LDLCALC, LDLDIRECT  Urinalysis    Component Value Date/Time   COLORURINE AMBER (A) 02/08/2021 1241   APPEARANCEUR CLOUDY (A) 02/08/2021 1241   LABSPEC 1.017 02/08/2021 1241   PHURINE 5.0 02/08/2021 1241   GLUCOSEU 50 (A) 02/08/2021 1241   HGBUR LARGE (A) 02/08/2021 1241   BILIRUBINUR SMALL (A) 02/08/2021 1241   KETONESUR 20 (A) 02/08/2021 1241   PROTEINUR 30 (A) 02/08/2021 1241   NITRITE NEGATIVE 02/08/2021 1241   LEUKOCYTESUR LARGE (A) 02/08/2021 1241    Results for orders placed or performed during the hospital encounter of 02/08/21  Urine culture     Status: Abnormal   Collection Time: 02/08/21 12:41 PM   Specimen: Urine, Random  Result Value Ref Range Status   Specimen Description   Final    URINE, RANDOM Performed at [facility], [address], [place], [place] [address]    Special Requests   Final    NONE Performed at [facility], [address], [place], [place] [address]    Culture >=100,000 COLONIES/mL ESCHERICHIA COLI (A)  Final   Report Status 02/10/2021 FINAL  Final   Organism ID, Bacteria ESCHERICHIA COLI (A)  Final      Susceptibility   Escherichia coli - MIC*    AMPICILLIN >=32 RESISTANT Resistant     CEFAZOLIN <=4 SENSITIVE Sensitive     CEFEPIME <=0.12 SENSITIVE Sensitive     CEFTRIAXONE <=0.25 SENSITIVE Sensitive     CIPROFLOXACIN <=0.25 SENSITIVE Sensitive     GENTAMICIN <=1 SENSITIVE Sensitive     IMIPENEM <=0.25 SENSITIVE Sensitive     NITROFURANTOIN <=16 SENSITIVE Sensitive     TRIMETH/SULFA <=20 SENSITIVE Sensitive     AMPICILLIN/SULBACTAM 4 SENSITIVE Sensitive     PIP/TAZO <=4 SENSITIVE Sensitive     * >=100,000 COLONIES/mL ESCHERICHIA COLI  Resp Panel by RT-PCR (Flu  A&B, Covid) Nasopharyngeal Swab     Status: None   Collection Time: 02/08/21 12:53 PM   Specimen: Nasopharyngeal Swab; Nasopharyngeal(NP) swabs in vial transport medium  Result Value Ref Range Status   SARS Coronavirus 2 by RT PCR NEGATIVE NEGATIVE Final    Comment: (NOTE) SARS-CoV-2 target nucleic acids are NOT DETECTED.  The SARS-CoV-2 RNA is generally detectable in upper respiratory specimens during the acute phase of infection. The lowest concentration of SARS-CoV-2 viral copies  this assay can detect is 138 copies/mL. A negative result does not preclude SARS-Cov-2 infection and should not be used as the sole basis for treatment or other patient management decisions. A negative result may occur with  improper specimen collection/handling, submission of specimen other than nasopharyngeal swab, presence of viral mutation(s) within the areas targeted by this assay, and inadequate number of viral copies(<138 copies/mL). A negative result must be combined with clinical observations, patient history, and epidemiological information. The expected result is Negative.  Fact Sheet for Patients:  BloggerCourse.com  Fact Sheet for Healthcare Providers:  SeriousBroker.it  This test is no t yet approved or cleared by the [place] FDA and  has been authorized for detection and/or diagnosis of SARS-CoV-2 by FDA under an Emergency Use Authorization (EUA). This EUA will remain  in effect (meaning this test can be used) for the duration of the COVID-19 declaration under Section 564(b)(1) of the Act, 21 U.S.C.section 360bbb-3(b)(1), unless the authorization is terminated  or revoked sooner.       Influenza A by PCR NEGATIVE NEGATIVE Final   Influenza B by PCR NEGATIVE NEGATIVE Final    Comment: (NOTE) The Xpert Xpress SARS-CoV-2/FLU/RSV plus assay is intended as an aid in the diagnosis of influenza from Nasopharyngeal swab specimens  and should not be used as a sole basis for treatment. Nasal washings and aspirates are unacceptable for Xpert Xpress SARS-CoV-2/FLU/RSV testing.  Fact Sheet for Patients: BloggerCourse.com  Fact Sheet for Healthcare Providers: SeriousBroker.it  This test is not yet approved or cleared by the [place] FDA and has been authorized for detection and/or diagnosis of SARS-CoV-2 by FDA under an Emergency Use Authorization (EUA). This EUA will remain in effect (meaning this test can be used) for the duration of the COVID-19 declaration under Section 564(b)(1) of the Act, 21 U.S.C. section 360bbb-3(b)(1), unless the authorization is terminated or revoked.  Performed at [facility], [address] [place], [place] [address]   C Difficile Quick Screen w PCR reflex     Status: None   Collection Time: 02/09/21 11:10 PM   Specimen: STOOL  Result Value Ref Range Status   C Diff antigen NEGATIVE NEGATIVE Final   C Diff toxin NEGATIVE NEGATIVE Final   C Diff interpretation No C. difficile detected.  Final    Comment: Performed at [facility], [address], [place], [place] [address]   Lab Results  Component Value Date   VITAMINB12 374 02/08/2021   Imaging I have reviewed the images obtained:  CT-scan of the brain No acute abnormality Atrophy and bilateral white matter ischemia which appears chronic.  MRI examination of the brain 02/08/2021: 1. No acute infarct. 2. Ventriculomegaly, which may be ex vacuo in the setting of moderate atrophy. Normal pressure hydrocephalus could have a similar appearance in the correct clinical setting. 3. Mild to moderate T2/FLAIR hyperintensities within the white matter. This finding is nonspecific but can be seen in the setting of chronic microvascular ischemia, a demyelinating process such as multiple sclerosis, chronic migraines, or as the sequelae of a prior  infectious or inflammatory process.  Assessment: 60 y.o. female with no PMHx who presented for evaluation of BLE numbness and tingling, incoordinated gait, blurry vision, and loss of bilateral hand dexterity.  - Examination reveals left > right lower extremity ataxia BUE ataxia, prominent muscle wasting as well as 4-/5 weakness of bilateral lower extremities with decreased sensation to light touch of the left arm. Attempted to stand patient  up and patient was unable to bear weight with her bilateral lower extremities and immediately collapsed and was assisted to the bed by NP.  - Localization based on exam includes spinal cord lesion or lesions resulting in a flaccid tetraparesis, versus a polyradiculoneuropathy.  - Ophthalmology evaluated patient yesterday 6/17 and diagnosed Ms. Mceachron with dry eyes, refractive error, and mild cataracts and suggested that no acute ophthalmic intervention was required, with outpatient follow up.  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 NPH was mentioned on the report as a possibility, but on review of the images by Neurology, the findings appear most consistent with atrophy with NPH being considered to be negligibly likely given the lack of abnormal periependymal FLAIR signal and the pronounced cortical atrophy.  - DDx for ataxia includes posterior column cord lesion and sensory ataxia due to peripheral nerve involvement. Cerebellar stroke ruled out by MRI. Vitamin B12 deficiency is known to cause lower extremity weakness; her level is low-normal at 374 but should be supplemented and an MMA and homocysteine level obtained to further evaluate. Chronic inflammatory demyelinating polyneuropathy and GBS are also on the differential diagnosis given her weakness, areflexia and her progressive sensory deficits. Vitamin deficiency myelopathy also on the DDx.   Recommendations: - Vitamin B12 low for neurological standards; goal B12 > 400, agree with  supplementation, which has been increased to 2000 mcg po qd. She should continue this as an outpatient.  - Vitamin E level (ordered) - Copper level (ordered) - Obtain Thiamine level (ordered) - MRI of cervical and thoracic spine with and without contrast (ordered) - LP with CSF studies if MRI of cervical and thoracic spine is unrevealing.  - Anti-GM1 antibody (ordered) - MMA and homocysteine level (ordered)  Pt seen by NP/Neuro  Lanae Boast, AGAC-NP [facility] Pager: [phone]  I have seen and examined the patient. I have formulated the assessment and recommendations. 60 y.o. female with no PMHx who presented for evaluation of BLE numbness and tingling, incoordinated gait, blurry vision, and loss of bilateral hand dexterity. DDx includes myelopathy and polyradiculoneuropathy. If MRI of cervical and thoracic spine is unrevealing, will need to obtain LP under fluoro.  Electronically signed: Dr. Caryl Pina

## 2021-02-13 NOTE — Progress Notes (Signed)
PROGRESS NOTE    [name] [name]  FEO:[id number] DOB: 07/01/1961 DOA: 02/08/2021 PCP: Pcp, No   Brief Narrative: 60 year old with no significant past medical history who presents complaining of nausea vomiting and vision changes and weakness.  She reports intermittent nausea and vomiting for the last 3 months.  Reports intermittent blurry vision.  She denies headache.  She reports worsening numbness and tingling in her hands and feet.  She feels weak all over.  Evaluation in the ED: Patient was found to have a potassium of 2.4.  UA consistent with UTI.     Assessment & Plan:   Active Problems:   Hypokalemia   Lower urinary tract infectious disease  1-Hypokalemia, Hyponatremia, Hypocalcemia: -Patient received IV potassium and oral potassium. Continue with oral potassium.  Received calcium supplement. Started oral calcium Replete mg and potasium.   2-Anemia; Thrombocytopenia;  Folic acid low. Replete IV.  Thrombocytopenia could de related to infection, HO alcohol use.  Peripheral smear. Macrocytic anemia, thrombocytopenia.  Platelet further decreased today. Hematology consulted.  Dr Julien Nordmann recommend Bone marrow biopsy. Bone marrow biopsy 6/16. Bone marrow biopsy pending.  Platelet stable.   3-Epigastric pain;  CT abdomen ; possible gastritis, lipase negative Started PPI. Added Carafate today.  Might need GI follow up. Endoscopy/. Patient decline inpatient evaluation.  Tramadol PRN.  C diff negative.    4-UTI; Continue with ceftriaxone. Plan for 5 days treatment.  Follow urine culture. Growing E coli.   5-Neuropathy; Lower extremities weakness.  Numbness and tingling stable.  Started T25 and folic acid.  MRI negative for stroke. Discussed with neurology MRI finding are non specific, appears more related to chronic vascular diseases than MS. Needs follow up with neurologist out patient.  Continue to complain of LE weakness, feels weakness is worse. I have consulted  neurology   Mild transaminases; CT hepatic steatosis. Hepatomegaly.  HTN; BP better, without medication.  Fall, denies hitting head. Has small hematoma second finger right hands. Plan to apply ice, keep elevated. Decline x ray at this time.  Blurry vision, decrease vision right Eye;  Ophthalmology consulted. Follow up out patient.   Tachycardia; sinus. IV bolus, IV fluids.   Estimated body mass index is 20.78 kg/m as calculated from the following:   Height as of this encounter: 5' 1"  (1.549 m).   Weight as of this encounter: 49.9 kg.   DVT prophylaxis: SCD Code Status: Partial Code; no intubation  Family Communication: Care discussed with patient. Sister updated 6/17 Disposition Plan:  Status is: Inpatient  Remains inpatient appropriate because:IV treatments appropriate due to intensity of illness or inability to take PO  Dispo: The patient is from:               Anticipated d/c is to: Home              Patient currently is not medically stable to d/c.   Difficult to place patient No        Consultants:  Neurology phone   Procedures:  None  Antimicrobials:    Subjective: Had another fall today, leg weakness. Feels leg weakness is worse.     Objective: Vitals:   02/13/21 0508 02/13/21 0510 02/13/21 1000 02/13/21 1229  BP: (!) 111/92  129/84 (!) 134/97  Pulse: (!) 116 (!) 102 (!) 134 (!) 103  Resp: 20  20   Temp: (!) 97.4 F (36.3 C) 98.1 F (36.7 C) 98.1 F (36.7 C) 98.4 F (36.9 C)  TempSrc: Oral  Oral Oral  SpO2: 96%  99% 99%  Weight:      Height:        Intake/Output Summary (Last 24 hours) at 02/13/2021 1538 Last data filed at 02/13/2021 1300 Gross per 24 hour  Intake 700 ml  Output 250 ml  Net 450 ml    Filed Weights   02/08/21 1210  Weight: 49.9 kg    Examination:  General exam: NAD Respiratory system: CTA Cardiovascular system: S 1, S 2 RRR Gastrointestinal system: BS present, soft, nt, nd Central nervous system: Non Focal.   Extremities: Symmetric power. Bruise finger after fall. Less swelling finger,    Data Reviewed: I have personally reviewed following labs and imaging studies  CBC: Recent Labs  Lab 02/08/21 1253 02/09/21 0334 02/10/21 0343 02/11/21 0346 02/12/21 0949 02/13/21 0437  WBC 11.9* 7.8 6.6 5.4 8.3 6.5  NEUTROABS 9.7*  --   --   --   --   --   HGB 10.1* 8.5* 8.4* 8.3* 9.4* 8.4*  HCT 28.5* 24.8* 24.7* 24.2* 26.8* 24.2*  MCV 102.2* 103.3* 103.3* 104.8* 103.1* 102.5*  PLT 53* 42* 38* 33* 34* 32*    Basic Metabolic Panel: Recent Labs  Lab 02/08/21 1253 02/08/21 2150 02/09/21 0334 02/09/21 1903 02/10/21 0809 02/11/21 0346 02/13/21 0437  NA 134*  --  135 133* 136 134* 133*  K 2.4*  --  3.5 3.1* 3.3* 4.0 3.0*  CL 96*  --  101 99 103 103 101  CO2 24  --  22 20* 23 26 24   GLUCOSE 136*  --  101* 120* 112* 115* 109*  BUN 7  --  <5* <5* <5* <5* <5*  CREATININE 0.67  --  0.42* 0.56 0.45 0.49 0.52  CALCIUM 8.2*  --  8.0* 7.9* 8.1* 8.1* 8.4*  MG 1.7 2.1  --   --   --   --  1.6*    GFR: Estimated Creatinine Clearance: 57.1 mL/min (by C-G formula based on SCr of 0.52 mg/dL). Liver Function Tests: Recent Labs  Lab 02/08/21 1253 02/08/21 2150 02/09/21 0334  AST 62*  --  50*  ALT 34  --  28  ALKPHOS 79  --  69  BILITOT 1.4* 1.1 0.8  PROT 7.2  --  6.2*  ALBUMIN 3.7  --  3.0*    Recent Labs  Lab 02/08/21 1253  LIPASE 51    No results for input(s): AMMONIA in the last 168 hours. Coagulation Profile: No results for input(s): INR, PROTIME in the last 168 hours. Cardiac Enzymes: No results for input(s): CKTOTAL, CKMB, CKMBINDEX, TROPONINI in the last 168 hours. BNP (last 3 results) No results for input(s): PROBNP in the last 8760 hours. HbA1C: No results for input(s): HGBA1C in the last 72 hours. CBG: No results for input(s): GLUCAP in the last 168 hours. Lipid Profile: No results for input(s): CHOL, HDL, LDLCALC, TRIG, CHOLHDL, LDLDIRECT in the last 72 hours. Thyroid  Function Tests: No results for input(s): TSH, T4TOTAL, FREET4, T3FREE, THYROIDAB in the last 72 hours.  Anemia Panel: No results for input(s): VITAMINB12, FOLATE, FERRITIN, TIBC, IRON, RETICCTPCT in the last 72 hours.  Sepsis Labs: No results for input(s): PROCALCITON, LATICACIDVEN in the last 168 hours.  Recent Results (from the past 240 hour(s))  Urine culture     Status: Abnormal   Collection Time: 02/08/21 12:41 PM   Specimen: Urine, Random  Result Value Ref Range Status   Specimen Description   Final    URINE, RANDOM Performed at  [facility], [place] [address], [place], [place] [address]    Special Requests   Final    NONE Performed at [facility], [place] [address] [place], [place] [address]    Culture >=100,000 COLONIES/mL ESCHERICHIA COLI (A)  Final   Report Status 02/10/2021 FINAL  Final   Organism ID, Bacteria ESCHERICHIA COLI (A)  Final      Susceptibility   Escherichia coli - MIC*    AMPICILLIN >=32 RESISTANT Resistant     CEFAZOLIN <=4 SENSITIVE Sensitive     CEFEPIME <=0.12 SENSITIVE Sensitive     CEFTRIAXONE <=0.25 SENSITIVE Sensitive     CIPROFLOXACIN <=0.25 SENSITIVE Sensitive     GENTAMICIN <=1 SENSITIVE Sensitive     IMIPENEM <=0.25 SENSITIVE Sensitive     NITROFURANTOIN <=16 SENSITIVE Sensitive     TRIMETH/SULFA <=20 SENSITIVE Sensitive     AMPICILLIN/SULBACTAM 4 SENSITIVE Sensitive     PIP/TAZO <=4 SENSITIVE Sensitive     * >=100,000 COLONIES/mL ESCHERICHIA COLI  Resp Panel by RT-PCR (Flu A&B, Covid) Nasopharyngeal Swab     Status: None   Collection Time: 02/08/21 12:53 PM   Specimen: Nasopharyngeal Swab; Nasopharyngeal(NP) swabs in vial transport medium  Result Value Ref Range Status   SARS Coronavirus 2 by RT PCR NEGATIVE NEGATIVE Final    Comment: (NOTE) SARS-CoV-2 target nucleic acids are NOT DETECTED.  The SARS-CoV-2 RNA is generally detectable in upper respiratory specimens during the acute phase of  infection. The lowest concentration of SARS-CoV-2 viral copies this assay can detect is 138 copies/mL. A negative result does not preclude SARS-Cov-2 infection and should not be used as the sole basis for treatment or other patient management decisions. A negative result may occur with  improper specimen collection/handling, submission of specimen other than nasopharyngeal swab, presence of viral mutation(s) within the areas targeted by this assay, and inadequate number of viral copies(<138 copies/mL). A negative result must be combined with clinical observations, patient history, and epidemiological information. The expected result is Negative.  Fact Sheet for Patients:  EntrepreneurPulse.com.au  Fact Sheet for Healthcare Providers:  IncredibleEmployment.be  This test is no t yet approved or cleared by the [place] FDA and  has been authorized for detection and/or diagnosis of SARS-CoV-2 by FDA under an Emergency Use Authorization (EUA). This EUA will remain  in effect (meaning this test can be used) for the duration of the COVID-19 declaration under Section 564(b)(1) of the Act, 21 U.S.C.section 360bbb-3(b)(1), unless the authorization is terminated  or revoked sooner.       Influenza A by PCR NEGATIVE NEGATIVE Final   Influenza B by PCR NEGATIVE NEGATIVE Final    Comment: (NOTE) The Xpert Xpress SARS-CoV-2/FLU/RSV plus assay is intended as an aid in the diagnosis of influenza from Nasopharyngeal swab specimens and should not be used as a sole basis for treatment. Nasal washings and aspirates are unacceptable for Xpert Xpress SARS-CoV-2/FLU/RSV testing.  Fact Sheet for Patients: EntrepreneurPulse.com.au  Fact Sheet for Healthcare Providers: IncredibleEmployment.be  This test is not yet approved or cleared by the [place] FDA and has been authorized for detection and/or diagnosis of SARS-CoV-2  by FDA under an Emergency Use Authorization (EUA). This EUA will remain in effect (meaning this test can be used) for the duration of the COVID-19 declaration under Section 564(b)(1) of the Act, 21 U.S.C. section 360bbb-3(b)(1), unless the authorization is terminated or revoked.  Performed at [facility], [place] [address], [place], [place] [address]  C Difficile Quick Screen w PCR reflex     Status: None   Collection Time: 02/09/21 11:10 PM   Specimen: STOOL  Result Value Ref Range Status   C Diff antigen NEGATIVE NEGATIVE Final   C Diff toxin NEGATIVE NEGATIVE Final   C Diff interpretation No C. difficile detected.  Final    Comment: Performed at [facility], [place] [address] [place], [place] [address]          Radiology Studies: No results found.      Scheduled Meds:  calcium carbonate  1 tablet Oral BID WC   folic acid  1 mg Oral Daily   pantoprazole  40 mg Oral BID   sucralfate  1 g Oral TID WC & HS   vitamin B-12  500 mcg Oral Daily   Continuous Infusions:  sodium chloride 75 mL/hr at 02/13/21 1251      LOS: 5 days    Time spent: 35 minutes.     Elmarie Shiley, MD [facility]   If 7PM-7AM, please contact night-coverage [email or web address]  02/13/2021, 3:38 PM

## 2021-02-13 NOTE — Progress Notes (Signed)
   02/13/21 1000  Assess: MEWS Score  Temp 98.1 F (36.7 C)  BP 129/84  Pulse Rate (!) 134  Resp 20  Level of Consciousness Alert  SpO2 99 %  O2 Device Room Air  Assess: MEWS Score  MEWS Temp 0  MEWS Systolic 0  MEWS Pulse 3  MEWS RR 0  MEWS LOC 0  MEWS Score 3  MEWS Score Color Yellow  Assess: if the MEWS score is Yellow or Red  Were vital signs taken at a resting state? Yes  Focused Assessment No change from prior assessment  Does the patient meet 2 or more of the SIRS criteria? No  MEWS guidelines implemented *See Row Information* Yes  Take Vital Signs  Increase Vital Sign Frequency  Yellow: Q 2hr X 2 then Q 4hr X 2, if remains yellow, continue Q 4hrs  Escalate  MEWS: Escalate Yellow: discuss with charge nurse/RN and consider discussing with provider and RRT  Notify: Charge Nurse/RN  Name of Charge Nurse/RN Notified Leonie Green, RN  Date Charge Nurse/RN Notified 02/13/21  Time Charge Nurse/RN Notified 1000  Notify: Provider  Provider Name/Title Regalado  Date Provider Notified 02/13/21  Time Provider Notified 1005  Notification Type Face-to-face  Notification Reason Other (Comment) (heart rate increased)  Provider response See new orders  Date of Provider Response 02/13/21  Time of Provider Response 1005  Assess: SIRS CRITERIA  SIRS Temperature  0  SIRS Pulse 1  SIRS Respirations  0  SIRS WBC 0  SIRS Score Sum  1

## 2021-02-13 NOTE — Progress Notes (Signed)
Assisted patient up to [facility] (2 max assist d/t weakness/multiple falls), patient voided approximately 50 mls urine. Bladder scan performed when back in bed, read as 203.  Will continue to monitor for urinary retention.

## 2021-02-14 LAB — CBC
HCT: 24.8 % — ABNORMAL LOW (ref 36.0–46.0)
Hemoglobin: 8.5 g/dL — ABNORMAL LOW (ref 12.0–15.0)
MCH: 35.6 pg — ABNORMAL HIGH (ref 26.0–34.0)
MCHC: 34.3 g/dL (ref 30.0–36.0)
MCV: 103.8 fL — ABNORMAL HIGH (ref 80.0–100.0)
Platelets: 32 10*3/uL — ABNORMAL LOW (ref 150–400)
RBC: 2.39 MIL/uL — ABNORMAL LOW (ref 3.87–5.11)
RDW: 16.2 % — ABNORMAL HIGH (ref 11.5–15.5)
WBC: 5.5 10*3/uL (ref 4.0–10.5)
nRBC: 0.5 % — ABNORMAL HIGH (ref 0.0–0.2)

## 2021-02-14 LAB — BASIC METABOLIC PANEL
Anion gap: 5 (ref 5–15)
BUN: <5 mg/dL — ABNORMAL LOW (ref 6–20)
CO2: 26 mmol/L (ref 22–32)
Calcium: 8.6 mg/dL — ABNORMAL LOW (ref 8.9–10.3)
Chloride: 104 mmol/L (ref 98–111)
Creatinine, Ser: 0.43 mg/dL — ABNORMAL LOW (ref 0.44–1.00)
GFR, Estimated: >60 mL/min (ref >60)
Glucose, Bld: 110 mg/dL — ABNORMAL HIGH (ref 70–99)
Potassium: 3.3 mmol/L — ABNORMAL LOW (ref 3.5–5.1)
Sodium: 135 mmol/L (ref 135–145)

## 2021-02-14 LAB — MISC LABCORP TEST (SEND OUT)

## 2021-02-14 MED ORDER — THIAMINE HCL 100 MG/ML IJ SOLN
100.0000 mg | Freq: Every day | INTRAMUSCULAR | Status: DC
  Administered 2021-02-14 – 2021-02-16 (×3): 100 mg via INTRAVENOUS
  Filled 2021-02-14 (×3): qty 2

## 2021-02-14 MED ORDER — POTASSIUM CHLORIDE CRYS ER 20 MEQ PO TBCR
40.0000 meq | EXTENDED_RELEASE_TABLET | Freq: Once | ORAL | Status: AC
  Administered 2021-02-14: 40 meq via ORAL
  Filled 2021-02-14: qty 2

## 2021-02-14 MED ORDER — MAGNESIUM SULFATE 2 GM/50ML IV SOLN
2.0000 g | Freq: Once | INTRAVENOUS | Status: AC
  Administered 2021-02-14: 2 g via INTRAVENOUS
  Filled 2021-02-14: qty 50

## 2021-02-14 MED ORDER — SODIUM CHLORIDE 0.9 % IV BOLUS
500.0000 mL | Freq: Once | INTRAVENOUS | Status: AC
  Administered 2021-02-14: 500 mL via INTRAVENOUS

## 2021-02-14 NOTE — Progress Notes (Signed)
PROGRESS NOTE    [name] [name]  WGN:[id number] DOB: 1961/07/22 DOA: 02/08/2021 PCP: Pcp, No   Brief Narrative: 60 year old with no significant past medical history who presents complaining of nausea vomiting and vision changes and weakness.  She reports intermittent nausea and vomiting for the last 3 months.  Reports intermittent blurry vision.  She denies headache.  She reports worsening numbness and tingling in her hands and feet.  She feels weak all over.  Evaluation in the ED: Patient was found to have a potassium of 2.4.  UA consistent with UTI.     Assessment & Plan:   Active Problems:   Hypokalemia   Lower urinary tract infectious disease  1-Hypokalemia, Hyponatremia, Hypocalcemia: -Patient received IV potassium and oral potassium. Received calcium supplement. Started oral calcium Replete mg and potasium.   2-Anemia; Thrombocytopenia;  Folic acid low. Replete IV.  Thrombocytopenia could de related to infection, HO alcohol use.  Peripheral smear. Macrocytic anemia, thrombocytopenia.  Platelet further decreased today. Hematology consulted.  Dr Julien Nordmann recommend Bone marrow biopsy. Bone marrow biopsy 6/16. Bone marrow biopsy pending.  Platelet stable.   3-Epigastric pain;  CT abdomen ; possible gastritis, lipase negative Started PPI. Added Carafate today.  Might need GI follow up. Endoscopy/. Patient decline inpatient evaluation.  Tramadol PRN.  C diff negative.    4-UTI; Completed 5 days ceftriaxone.  Follow urine culture. Growing E coli.   5-Lower extremities weakness.  Numbness and tingling stable.  Started H84 and folic acid.  MRI negative for stroke. Discussed with neurology MRI finding are non specific, appears more related to chronic vascular diseases than MS. Needs follow up with neurologist out patient.  Continue to complain of LE weakness, feels weakness is worse. I have consulted neurology 6/18. MRI cervical and thoracic unrevealing.  Plan for LP;   Differential; AIDP or CIDP, Vs less likely severe nutritional Neuropathy.  Labs Pending; Anti- GM1, MMA, Homocysteine, Thiamine level, copper, vitamin E;   Mild transaminases; CT hepatic steatosis. Hepatomegaly.  HTN; BP better, without medication.  Fall, denies hitting head. Has small hematoma second finger right hands. Plan to apply ice, keep elevated. Decline x ray at this time.  Blurry vision, decrease vision right Eye;  Ophthalmology consulted. Patient diagnose with dry eye, glaucoma. Follow up out patient.   Tachycardia; sinus. IV bolus, IV fluids.   Estimated body mass index is 20.78 kg/m as calculated from the following:   Height as of this encounter: $RemoveBeforeD'5\' 1"'lLAifXsmJRqosx$  (1.549 m).   Weight as of this encounter: 49.9 kg.   DVT prophylaxis: SCD Code Status: Partial Code; no intubation  Family Communication: Care discussed with patient. Sister updated 6/17 Disposition Plan:  Status is: Inpatient  Remains inpatient appropriate because:IV treatments appropriate due to intensity of illness or inability to take PO  Dispo: The patient is from:               Anticipated d/c is to: Home              Patient currently is not medically stable to d/c.   Difficult to place patient No        Consultants:  Neurology phone   Procedures:  None  Antimicrobials:    Subjective: She denies worsening of her symptoms today.  Mild epigastric pain   Objective: Vitals:   02/13/21 1500 02/13/21 1800 02/13/21 2142 02/14/21 0446  BP: (!) 131/91 (!) 143/96 (!) 128/94 (!) 129/98  Pulse:  (!) 102 (!) 101 93  Resp:  15 13  Temp: 97.8 F (36.6 C) 98.8 F (37.1 C) 98.1 F (36.7 C) 98 F (36.7 C)  TempSrc: Oral Oral  Oral  SpO2: 99% 98% 94% 98%  Weight:      Height:        Intake/Output Summary (Last 24 hours) at 02/14/2021 1110 Last data filed at 02/14/2021 0900 Gross per 24 hour  Intake 240 ml  Output 527 ml  Net -287 ml    Filed Weights   02/08/21 1210  Weight: 49.9 kg     Examination:  General exam: NAD Respiratory system: CTA Cardiovascular system: S 1, S 2 RRR Gastrointestinal system: BS present, soft, mild tenderness Central nervous system: Generalized weakness Extremities: Symmetric power. Bruise finger after fall. Less swelling finger,    Data Reviewed: I have personally reviewed following labs and imaging studies  CBC: Recent Labs  Lab 02/08/21 1253 02/09/21 0334 02/10/21 0343 02/11/21 0346 02/12/21 0949 02/13/21 0437 02/14/21 0852  WBC 11.9*   < > 6.6 5.4 8.3 6.5 5.5  NEUTROABS 9.7*  --   --   --   --   --   --   HGB 10.1*   < > 8.4* 8.3* 9.4* 8.4* 8.5*  HCT 28.5*   < > 24.7* 24.2* 26.8* 24.2* 24.8*  MCV 102.2*   < > 103.3* 104.8* 103.1* 102.5* 103.8*  PLT 53*   < > 38* 33* 34* 32* 32*   < > = values in this interval not displayed.    Basic Metabolic Panel: Recent Labs  Lab 02/08/21 1253 02/08/21 2150 02/09/21 0334 02/09/21 1903 02/10/21 0809 02/11/21 0346 02/13/21 0437 02/14/21 0852  NA 134*  --    < > 133* 136 134* 133* 135  K 2.4*  --    < > 3.1* 3.3* 4.0 3.0* 3.3*  CL 96*  --    < > 99 103 103 101 104  CO2 24  --    < > 20* _0 GLUCOSE 136*  --    < > 120* 112* 115* 109* 110*  BUN 7  --    < > <5* <5* <5* <5* <5*  CREATININE 0.67  --    < > 0.56 0.45 0.49 0.52 0.43*  CALCIUM 8.2*  --    < > 7.9* 8.1* 8.1* 8.4* 8.6*  MG 1.7 2.1  --   --   --   --  1.6*  --    < > = values in this interval not displayed.    GFR: Estimated Creatinine Clearance: 57.1 mL/min (A) (by C-G formula based on SCr of 0.43 mg/dL (L)). Liver Function Tests: Recent Labs  Lab 02/08/21 1253 02/08/21 2150 02/09/21 0334  AST 62*  --  50*  ALT 34  --  28  ALKPHOS 79  --  69  BILITOT 1.4* 1.1 0.8  PROT 7.2  --  6.2*  ALBUMIN 3.7  --  3.0*    Recent Labs  Lab 02/08/21 1253  LIPASE 51    No results for input(s): AMMONIA in the last 168 hours. Coagulation Profile: No results for input(s): INR, PROTIME in the last 168  hours. Cardiac Enzymes: No results for input(s): CKTOTAL, CKMB, CKMBINDEX, TROPONINI in the last 168 hours. BNP (last 3 results) No results for input(s): PROBNP in the last 8760 hours. HbA1C: No results for input(s): HGBA1C in the last 72 hours. CBG: No results for input(s): GLUCAP in the last 168 hours. Lipid Profile: No  results for input(s): CHOL, HDL, LDLCALC, TRIG, CHOLHDL, LDLDIRECT in the last 72 hours. Thyroid Function Tests: No results for input(s): TSH, T4TOTAL, FREET4, T3FREE, THYROIDAB in the last 72 hours.  Anemia Panel: No results for input(s): VITAMINB12, FOLATE, FERRITIN, TIBC, IRON, RETICCTPCT in the last 72 hours.  Sepsis Labs: No results for input(s): PROCALCITON, LATICACIDVEN in the last 168 hours.  Recent Results (from the past 240 hour(s))  Urine culture     Status: Abnormal   Collection Time: 02/08/21 12:41 PM   Specimen: Urine, Random  Result Value Ref Range Status   Specimen Description   Final    URINE, RANDOM Performed at [place] [address] [place], [place] [address]    Special Requests   Final    NONE Performed at [facility], [place] [address], [place], [place] [address]    Culture >=100,000 COLONIES/mL ESCHERICHIA COLI (A)  Final   Report Status 02/10/2021 FINAL  Final   Organism ID, Bacteria ESCHERICHIA COLI (A)  Final      Susceptibility   Escherichia coli - MIC*    AMPICILLIN >=32 RESISTANT Resistant     CEFAZOLIN <=4 SENSITIVE Sensitive     CEFEPIME <=0.12 SENSITIVE Sensitive     CEFTRIAXONE <=0.25 SENSITIVE Sensitive     CIPROFLOXACIN <=0.25 SENSITIVE Sensitive     GENTAMICIN <=1 SENSITIVE Sensitive     IMIPENEM <=0.25 SENSITIVE Sensitive     NITROFURANTOIN <=16 SENSITIVE Sensitive     TRIMETH/SULFA <=20 SENSITIVE Sensitive     AMPICILLIN/SULBACTAM 4 SENSITIVE Sensitive     PIP/TAZO <=4 SENSITIVE Sensitive     * >=100,000 COLONIES/mL ESCHERICHIA COLI  Resp Panel by RT-PCR (Flu A&B,  Covid) Nasopharyngeal Swab     Status: None   Collection Time: 02/08/21 12:53 PM   Specimen: Nasopharyngeal Swab; Nasopharyngeal(NP) swabs in vial transport medium  Result Value Ref Range Status   SARS Coronavirus 2 by RT PCR NEGATIVE NEGATIVE Final    Comment: (NOTE) SARS-CoV-2 target nucleic acids are NOT DETECTED.  The SARS-CoV-2 RNA is generally detectable in upper respiratory specimens during the acute phase of infection. The lowest concentration of SARS-CoV-2 viral copies this assay can detect is 138 copies/mL. A negative result does not preclude SARS-Cov-2 infection and should not be used as the sole basis for treatment or other patient management decisions. A negative result may occur with  improper specimen collection/handling, submission of specimen other than nasopharyngeal swab, presence of viral mutation(s) within the areas targeted by this assay, and inadequate number of viral copies(<138 copies/mL). A negative result must be combined with clinical observations, patient history, and epidemiological information. The expected result is Negative.  Fact Sheet for Patients:  EntrepreneurPulse.com.au  Fact Sheet for Healthcare Providers:  IncredibleEmployment.be  This test is no t yet approved or cleared by the [place] FDA and  has been authorized for detection and/or diagnosis of SARS-CoV-2 by FDA under an Emergency Use Authorization (EUA). This EUA will remain  in effect (meaning this test can be used) for the duration of the COVID-19 declaration under Section 564(b)(1) of the Act, 21 U.S.C.section 360bbb-3(b)(1), unless the authorization is terminated  or revoked sooner.       Influenza A by PCR NEGATIVE NEGATIVE Final   Influenza B by PCR NEGATIVE NEGATIVE Final    Comment: (NOTE) The Xpert Xpress SARS-CoV-2/FLU/RSV plus assay is intended as an aid in the diagnosis of influenza from Nasopharyngeal swab specimens and should  not be used as a  sole basis for treatment. Nasal washings and aspirates are unacceptable for Xpert Xpress SARS-CoV-2/FLU/RSV testing.  Fact Sheet for Patients: EntrepreneurPulse.com.au  Fact Sheet for Healthcare Providers: IncredibleEmployment.be  This test is not yet approved or cleared by the [place] FDA and has been authorized for detection and/or diagnosis of SARS-CoV-2 by FDA under an Emergency Use Authorization (EUA). This EUA will remain in effect (meaning this test can be used) for the duration of the COVID-19 declaration under Section 564(b)(1) of the Act, 21 U.S.C. section 360bbb-3(b)(1), unless the authorization is terminated or revoked.  Performed at [facility], [place] [address], [place], [place] [address]   C Difficile Quick Screen w PCR reflex     Status: None   Collection Time: 02/09/21 11:10 PM   Specimen: STOOL  Result Value Ref Range Status   C Diff antigen NEGATIVE NEGATIVE Final   C Diff toxin NEGATIVE NEGATIVE Final   C Diff interpretation No C. difficile detected.  Final    Comment: Performed at [facility], [place] [address], [place], [place] [address]          Radiology Studies: MR CERVICAL SPINE W WO CONTRAST  Result Date: 02/13/2021 CLINICAL DATA:  Myelopathy EXAM: MRI CERVICAL SPINE WITHOUT AND WITH CONTRAST TECHNIQUE: Multiplanar and multiecho pulse sequences of the cervical spine, to include the craniocervical junction and cervicothoracic junction, were obtained without and with intravenous contrast. CONTRAST:  46m GADAVIST GADOBUTROL 1 MMOL/ML IV SOLN COMPARISON:  None. FINDINGS: Alignment: Grade 1 retrolisthesis at C5-6 Vertebrae: No fracture, evidence of discitis, or bone lesion. Cord: Normal signal and morphology. No abnormal contrast enhancement. Posterior Fossa, vertebral arteries, paraspinal tissues: Negative. Disc levels: C1-C2: Normal. C2-C3: Normal disc space and  facets. No spinal canal or neuroforaminal stenosis. C3-C4: Moderate right facet hypertrophy. No spinal canal or neural foraminal stenosis. C4-C5: Normal disc space and facets. No spinal canal or neuroforaminal stenosis. C5-C6: Small disc bulge with bilateral uncovertebral osteophytes. Moderate right foraminal stenosis. No spinal canal stenosis. C6-C7: Small central disc protrusion without spinal canal or neural foraminal stenosis. C7-T1: Normal disc space and facets. No spinal canal or neuroforaminal stenosis. IMPRESSION: 1. No spinal canal stenosis. 2. Moderate right C5-6 neural foraminal stenosis due to uncovertebral osteophytes. 3. Small central disc protrusion at C6-C7 without spinal canal or neural foraminal stenosis. Electronically Signed   By: KUlyses JarredM.D.   On: 02/13/2021 21:05   MR THORACIC SPINE W WO CONTRAST  Result Date: 02/13/2021 CLINICAL DATA:  Myelopathy EXAM: MRI THORACIC WITHOUT AND WITH CONTRAST TECHNIQUE: Multiplanar and multiecho pulse sequences of the thoracic spine were obtained without and with intravenous contrast. CONTRAST:  435mGADAVIST GADOBUTROL 1 MMOL/ML IV SOLN COMPARISON:  None. FINDINGS: Alignment:  Physiologic. Vertebrae: No fracture, evidence of discitis, or bone lesion. Cord: Normal signal and morphology. No abnormal contrast enhancement Paraspinal and other soft tissues: Negative Disc levels: There is a small central disc protrusion at T10-11 without spinal canal or neural foraminal stenosis. The other disc levels are normal. IMPRESSION: 1. No thoracic spinal canal or neural foraminal stenosis. 2. Small central disc protrusion at T10-11 without spinal canal or neural foraminal stenosis. Electronically Signed   By: KeUlyses Jarred.D.   On: 02/13/2021 21:11        Scheduled Meds:  calcium carbonate  1 tablet Oral BID WC   folic acid  1 mg Oral Daily   pantoprazole  40 mg Oral BID   potassium chloride  20 mEq Oral Daily  potassium chloride  40 mEq Oral Once    sucralfate  1 g Oral TID WC & HS   thiamine injection  100 mg Intravenous Daily   vitamin B-12  2,000 mcg Oral Daily   Continuous Infusions:  sodium chloride 75 mL/hr at 02/13/21 1855   magnesium sulfate bolus IVPB        LOS: 6 days    Time spent: 35 minutes.     Elmarie Shiley, MD [facility]   If 7PM-7AM, please contact night-coverage [email or web address]  02/14/2021, 11:10 AM

## 2021-02-14 NOTE — Progress Notes (Addendum)
MRI of cervical spine reveals no spinal cord lesions. Minor degenerative changes mentioned in the report do not account for the patient's weakness.   MRI of thoracic spine reveals no thoracic spinal canal or neural foraminal stenosis. No spinal cord lesions seen.   A/R:  60 y.o. female with no PMHx who presented for evaluation of BLE numbness and tingling, incoordinated gait, blurry vision, and loss of bilateral hand dexterity. - Given the negative MRI findings, which essentially rule out a transverse myelitis and demyelinating disease, the most likely localization for the patient's deficits is a multifocal or diffuse process involving the motor nerves, such as AIDP or CIDP. A severe nutritional neuropathy is also on the DDx but less likely.  - Will need fluoro-guided LP at [facility]. Labs to include cell count with differential, protein, glucose, VDRL, IgG index and oligoclonal bands (ordered) - Use SCDs rather than Lovenox for DVT prophylaxis prior to LP (SCDs have been ordered) - Labs pending: Anti-GM1 Ab, MMA, homocysteine, thiamine level, copper, vitamin E level.   Electronically signed: Dr. Caryl Pina

## 2021-02-15 ENCOUNTER — Telehealth: Payer: Self-pay | Admitting: Internal Medicine

## 2021-02-15 ENCOUNTER — Inpatient Hospital Stay (HOSPITAL_COMMUNITY): Payer: BC Managed Care – PPO

## 2021-02-15 LAB — CBC
HCT: 23.2 % — ABNORMAL LOW (ref 36.0–46.0)
Hemoglobin: 8 g/dL — ABNORMAL LOW (ref 12.0–15.0)
MCH: 35.9 pg — ABNORMAL HIGH (ref 26.0–34.0)
MCHC: 34.5 g/dL (ref 30.0–36.0)
MCV: 104 fL — ABNORMAL HIGH (ref 80.0–100.0)
Platelets: 35 10*3/uL — ABNORMAL LOW (ref 150–400)
RBC: 2.23 MIL/uL — ABNORMAL LOW (ref 3.87–5.11)
RDW: 16.7 % — ABNORMAL HIGH (ref 11.5–15.5)
WBC: 6.4 10*3/uL (ref 4.0–10.5)
nRBC: 0 % (ref 0.0–0.2)

## 2021-02-15 LAB — CBC WITH DIFFERENTIAL/PLATELET
Abs Immature Granulocytes: 0.06 10*3/uL (ref 0.00–0.07)
Basophils Absolute: 0.1 10*3/uL (ref 0.0–0.1)
Basophils Relative: 1 %
Eosinophils Absolute: 0.2 10*3/uL (ref 0.0–0.5)
Eosinophils Relative: 2 %
HCT: 24.5 % — ABNORMAL LOW (ref 36.0–46.0)
Hemoglobin: 8.4 g/dL — ABNORMAL LOW (ref 12.0–15.0)
Immature Granulocytes: 1 %
Lymphocytes Relative: 20 %
Lymphs Abs: 1.6 10*3/uL (ref 0.7–4.0)
MCH: 35.7 pg — ABNORMAL HIGH (ref 26.0–34.0)
MCHC: 34.3 g/dL (ref 30.0–36.0)
MCV: 104.3 fL — ABNORMAL HIGH (ref 80.0–100.0)
Monocytes Absolute: 0.7 10*3/uL (ref 0.1–1.0)
Monocytes Relative: 9 %
Neutro Abs: 5.2 10*3/uL (ref 1.7–7.7)
Neutrophils Relative %: 67 %
Platelets: 60 10*3/uL — ABNORMAL LOW (ref 150–400)
RBC: 2.35 MIL/uL — ABNORMAL LOW (ref 3.87–5.11)
RDW: 16.6 % — ABNORMAL HIGH (ref 11.5–15.5)
WBC: 7.7 10*3/uL (ref 4.0–10.5)
nRBC: 0 % (ref 0.0–0.2)

## 2021-02-15 LAB — GRAM STAIN: Gram Stain: NONE SEEN

## 2021-02-15 LAB — PROTEIN, CSF: Total  Protein, CSF: 38 mg/dL (ref 15–45)

## 2021-02-15 LAB — BASIC METABOLIC PANEL
Anion gap: 5 (ref 5–15)
BUN: <5 mg/dL — ABNORMAL LOW (ref 6–20)
CO2: 26 mmol/L (ref 22–32)
Calcium: 8.5 mg/dL — ABNORMAL LOW (ref 8.9–10.3)
Chloride: 105 mmol/L (ref 98–111)
Creatinine, Ser: 0.41 mg/dL — ABNORMAL LOW (ref 0.44–1.00)
GFR, Estimated: >60 mL/min (ref >60)
Glucose, Bld: 110 mg/dL — ABNORMAL HIGH (ref 70–99)
Potassium: 3.6 mmol/L (ref 3.5–5.1)
Sodium: 136 mmol/L (ref 135–145)

## 2021-02-15 LAB — SURGICAL PATHOLOGY

## 2021-02-15 LAB — CSF CELL COUNT WITH DIFFERENTIAL
RBC Count, CSF: 1 /mm3 — ABNORMAL HIGH
Tube #: 4
WBC, CSF: 1 /mm3 (ref 0–5)

## 2021-02-15 LAB — HOMOCYSTEINE: Homocysteine: 26 umol/L — ABNORMAL HIGH (ref 0.0–14.5)

## 2021-02-15 LAB — ABO/RH: ABO/RH(D): A POS

## 2021-02-15 LAB — MAGNESIUM: Magnesium: 2 mg/dL (ref 1.7–2.4)

## 2021-02-15 LAB — GLUCOSE, CSF: Glucose, CSF: 63 mg/dL (ref 40–70)

## 2021-02-15 LAB — TYPE AND SCREEN
ABO/RH(D): A POS
Antibody Screen: NEGATIVE

## 2021-02-15 IMAGING — RF DG FLUORO GUIDE SPINAL/SI JT INJ*L*
2 series · 4 of 4 positions shown · non-contrast
Comparison: Thoracic spine MRI [DATE].

CLINICAL DATA: Tetraplegia.

EXAM:
DIAGNOSTIC LUMBAR PUNCTURE UNDER FLUOROSCOPIC GUIDANCE

[Series 1: cp_standard · 0.17mm/px · 1 of 1 slices shown (1 of 2)]
[im 1/1]
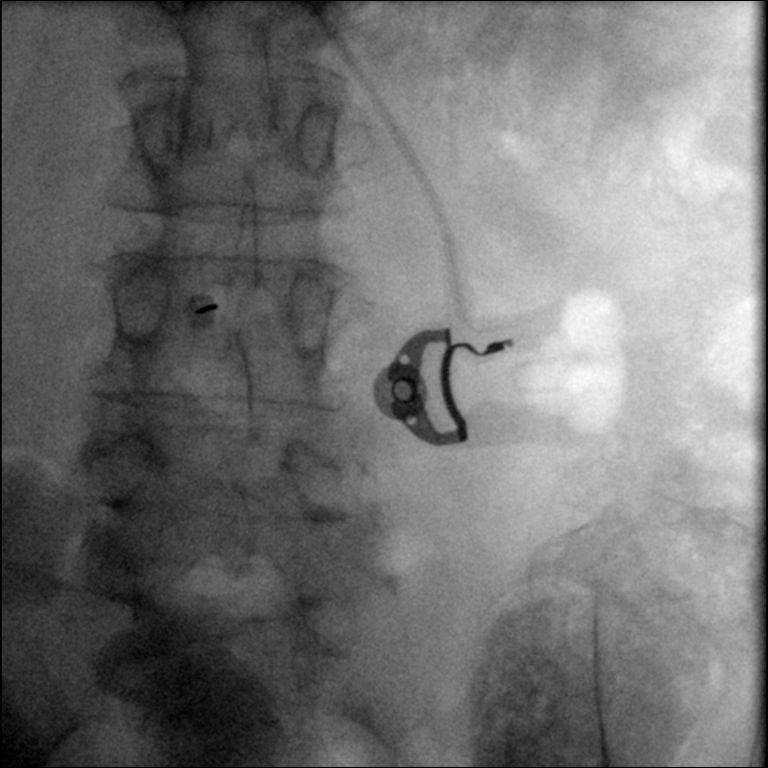

[Series 2: cp_standard · 0.34mm/px · 3 of 4 frames shown (2 of 2)]
[frame 1/4]
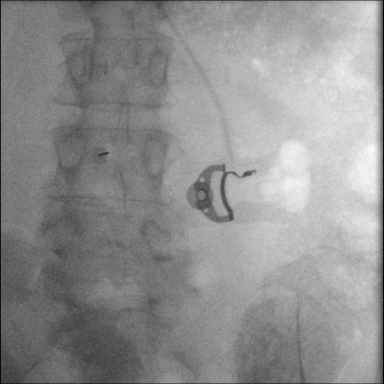
[frame 3/4]
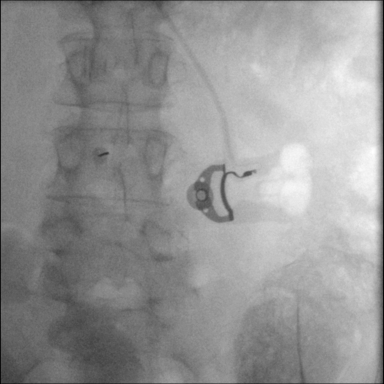
[frame 4/4]
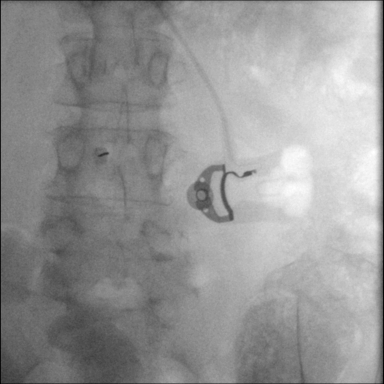

[4 of 4 positions shown; findings below may reference images not displayed]

CT abdomen/pelvis
[DATE].

FLUOROSCOPY TIME:  Fluoroscopy Time:  6 seconds

Radiation Exposure Index (if provided by the fluoroscopic device):
4.6 mGy

Number of Acquired Spot Images: None

PROCEDURE:
Informed consent was obtained from the patient prior to the
procedure, including a discussion of potential complications
including but not limited to headache, allergy, pain, bleeding,
infection, spinal cord/nerve root injury. With the patient prone,
the lower back was prepped with Betadine. 1% Lidocaine was used for
local anesthesia. Lumbar puncture was performed at the L3-L4 level
using a 20 gauge needle with return of clear CSF. 16 ml of CSF were
obtained for laboratory studies. The patient tolerated the procedure
well without immediate postprocedure complication.
IMPRESSION: Technically successful L3-L4 fluoroscopically guided lumbar
puncture.

16 mL of CSF obtained for laboratory studies.

No immediate post-procedure complication.

## 2021-02-15 MED ORDER — METOPROLOL TARTRATE 25 MG PO TABS
12.5000 mg | ORAL_TABLET | Freq: Two times a day (BID) | ORAL | Status: DC
  Administered 2021-02-15 – 2021-02-18 (×7): 12.5 mg via ORAL
  Filled 2021-02-15 (×7): qty 1

## 2021-02-15 MED ORDER — SODIUM CHLORIDE 0.9% IV SOLUTION: Freq: Once | INTRAVENOUS | Status: AC

## 2021-02-15 MED ORDER — LIDOCAINE HCL (PF) 1 % IJ SOLN
INTRAMUSCULAR | Status: AC
  Filled 2021-02-15: qty 10

## 2021-02-15 MED ORDER — ENSURE ENLIVE PO LIQD
237.0000 mL | Freq: Two times a day (BID) | ORAL | Status: DC
  Administered 2021-02-16 – 2021-02-23 (×7): 237 mL via ORAL

## 2021-02-15 NOTE — Progress Notes (Signed)
Physical Therapy Treatment Patient Details Name: [name] [name] MRN: [id number] DOB: 1961-06-10 Today's Date: 02/15/2021    History of Present Illness 60 year old with no significant past medical history who presents complaining of nausea vomiting and vision changes and weakness.  She reports intermittent nausea and vomiting for the last 3 months.  Reports intermittent blurry vision. She reports worsening numbness and tingling in her hands and feet. She feels weak all over.  Pt found to be hypokalemic and with UTI. MRI revealed: 1. No acute infarct.  2. Ventriculomegaly, which may be ex vacuo in the setting of  moderate atrophy. Normal pressure hydrocephalus could have a similar  appearance in the correct clinical setting.  3. Mild to moderate T2/FLAIR hyperintensities within the white  matter. This finding is nonspecific but can be seen in the setting  of chronic microvascular ischemia, a demyelinating process such as multiple sclerosis, chronic migraines, or as the sequelae of a prior  infectious or inflammatory process.    PT Comments    Pt is not progressing d/t significant ataxia, decr vision, uncontrolled ballistic type movements bil LEs--requiring incr assist overall. Focused on sit ot stand transfers and sitting balance today. Continue PT POC.  Follow Up Recommendations  SNF     Equipment Recommendations  None recommended by PT    Recommendations for Other Services       Precautions / Restrictions Precautions Precautions: Fall Precaution Comments: profound ataxia and decreased vision R>L Restrictions Weight Bearing Restrictions: No    Mobility  Bed Mobility Overal bed mobility: Needs Assistance Bed Mobility: Supine to Sit     Supine to sit: Min assist Sit to supine: Min guard   General bed mobility comments: min assist/facilitation LEs off bed. min/guard for safety on return to supine, pt with uncontrolled ataxic movments bil LEs    Transfers Overall transfer level:  Needs assistance Equipment used: 2 person hand held assist Transfers: Sit to/from Stand Sit to Stand: Min assist;Mod assist;+2 physical assistance;+2 safety/equipment         General transfer comment: reapeated sit to stand transfers with bil knee support, tactile input through feet,  bil HHA. assist with anterior-superior wt shift and to bring COG over BOS. pt with significant retropulsion in standing; pt with one episode of bil knee buckling requiring +2 max assist to maintain upright  Ambulation/Gait             General Gait Details: deferred d/t knee bucklignin standing   Stairs             Wheelchair Mobility    Modified Rankin (Stroke Patients Only)       Balance   Sitting-balance support: Single extremity supported;Bilateral upper extremity supported;Feet supported;Feet unsupported Sitting balance-Leahy Scale: Poor Sitting balance - Comments: pt with posterior bias in sitting, ballistic movements LEs in sitting; min-guard to heavy min assist to maintain midline   Standing balance support: Bilateral upper extremity supported Standing balance-Leahy Scale: Poor Standing balance comment: reliant on UE support,  and external assist                            Cognition Arousal/Alertness: Awake/alert Behavior During Therapy: WFL for tasks assessed/performed;Flat affect Overall Cognitive Status: Within Functional Limits for tasks assessed  Exercises General Exercises - Lower Extremity Long Arc Quad: AROM;Both;5 reps    General Comments        Pertinent Vitals/Pain Pain Assessment: Faces Faces Pain Scale: Hurts even more Pain Location: hands, feet Pain Descriptors / Indicators: Discomfort;Grimacing;Guarding;Pins and needles;Heaviness Pain Intervention(s): Limited activity within patient's tolerance;Monitored during session;Repositioned    Home Living                      Prior  Function            PT Goals (current goals can now be found in the care plan section) Acute Rehab PT Goals Patient Stated Goal: regain independence PT Goal Formulation: With patient Time For Goal Achievement: 02/24/21 Potential to Achieve Goals: Fair Progress towards PT goals: Progressing toward goals    Frequency    Min 3X/week      PT Plan Current plan remains appropriate    Co-evaluation PT/OT/SLP Co-Evaluation/Treatment: Yes Reason for Co-Treatment: Complexity of the patient's impairments (multi-system involvement);For patient/therapist safety;To address functional/ADL transfers          AM-PAC PT "6 Clicks" Mobility   Outcome Measure  Help needed turning from your back to your side while in a flat bed without using bedrails?: A Lot Help needed moving from lying on your back to sitting on the side of a flat bed without using bedrails?: A Little Help needed moving to and from a bed to a chair (including a wheelchair)?: Total Help needed standing up from a chair using your arms (e.g., wheelchair or bedside chair)?: Total Help needed to walk in hospital room?: Total Help needed climbing 3-5 steps with a railing? : Total 6 Click Score: 9    End of Session Equipment Utilized During Treatment: Gait belt Activity Tolerance: Treatment limited secondary to medical complications (Comment) Patient left: in bed;with bed alarm set;with call bell/phone within reach Nurse Communication: Mobility status PT Visit Diagnosis: Unsteadiness on feet (R26.81);Other abnormalities of gait and mobility (R26.89);Difficulty in walking, not elsewhere classified (R26.2)     Time: 7353-2992 PT Time Calculation (min) (ACUTE ONLY): 38 min  Charges:  $Therapeutic Activity: 8-22 mins $Neuromuscular Re-education: 8-22 mins                     Delice Bison, PT  Acute Rehab Dept ([facility]) [phone] Pager [phone]  02/15/2021    [facility] 02/15/2021, 4:07 PM

## 2021-02-15 NOTE — Progress Notes (Signed)
PROGRESS NOTE    [name] [name]  MRN:[id number] DOB: 07/20/1961 DOA: 02/08/2021 PCP: Pcp, No   Brief Narrative: 60-year-old with no significant past medical history who presents complaining of nausea vomiting and vision changes and weakness.  She reports intermittent nausea and vomiting for the last 3 months.  Reports intermittent blurry vision.  She denies headache.  She reports worsening numbness and tingling in her hands and feet.  She feels weak all over.  Evaluation in the ED: Patient was found to have a potassium of 2.4.  UA consistent with UTI.     Assessment & Plan:   Active Problems:   Hypokalemia   Lower urinary tract infectious disease  1-Hypokalemia, Hyponatremia, Hypocalcemia: -Patient received IV potassium and oral potassium. Received calcium supplement. Started oral calcium Replete mg and potasium.   2-Anemia; Thrombocytopenia;  Folic acid low. Replete IV.  Thrombocytopenia could de related to infection, HO alcohol use.  Peripheral smear. Macrocytic anemia, thrombocytopenia.  Platelet further decreased today. Hematology consulted.  Dr Mohamed recommend Bone marrow biopsy. Bone marrow biopsy 6/16. 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   3-Lower extremities weakness.  -Numbness and tingling stable.  -Started B12 and folic acid.  -MRI negative for stroke. Discussed with neurology MRI finding are non specific, appears more related to chronic vascular diseases than MS. Needs follow up with neurologist out patient.  -Continue to complain of LE weakness, feels weakness is worse. I have consulted neurology 6/18. -MRI cervical and thoracic unrevealing.  -Plan for LP; Needs platelet transfusion.  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  Labs  Pending; Anti- GM1, MMA, Homocysteine 26, Thiamine level, copper, vitamin E;    4-Epigastric pain;  CT abdomen ; possible gastritis, lipase negative Started PPI. Added Carafate today.  Might need GI follow up. Endoscopy/. Patient decline inpatient evaluation.  Tramadol PRN.  C diff negative.    5-UTI; Completed 5 days ceftriaxone.  Follow urine culture. Growing E coli.   6-Mild transaminases; CT hepatic steatosis. Hepatomegaly.   7-HTN; BP better, without medication.  Fall, denies hitting head. Has small hematoma second finger right hands. Plan to apply ice, keep elevated. Decline x ray at this time.  8-Blurry vision, decrease vision right Eye;  Ophthalmology consulted. Patient diagnose with dry eye, glaucoma. Follow up out patient.   9-Tachycardia; sinus. IV bolus, IV fluids.  Started low dose BB>   Estimated body mass index is 20.78 kg/m as calculated from the following:   Height as of this encounter: 5' 1" (1.549 m).   Weight as of this encounter: 49.9 kg.   DVT prophylaxis: SCD Code Status: Partial Code; no intubation  Family Communication: Care discussed with patient. Sister updated 6/17 Disposition Plan:  Status is: Inpatient  Remains inpatient appropriate because:IV treatments appropriate due to intensity of illness or inability to take PO  Dispo: The patient is from:               Anticipated d/c is to: Home              Patient currently is not medically stable to d/c.   Difficult to place patient No        Consultants:  Neurology phone   Procedures:  None  Antimicrobials:    Subjective: Weakness stable. No new complains. Agree to have platelet transfusion.     Objective: Vitals:   02/15/21 1205 02/15/21 1239 02/15/21 1436 02/15/21 1510  BP: (!) 131/100 (!) 135/102 (!) 135/97 (!) 133/93  Pulse: 89 80 87 88  Resp: 14 14  16  Temp: 98.6 F (37 C) 98.5 F (36.9 C) 98 F (36.7 C) 98.4 F (36.9 C)  TempSrc: Oral Oral Oral Oral  SpO2: 98% 99%  99% 97%  Weight:      Height:        Intake/Output Summary (Last 24 hours) at 02/15/2021 1516 Last data filed at 02/15/2021 1436 Gross per 24 hour  Intake 2839.71 ml  Output 900 ml  Net 1939.71 ml    Filed Weights   02/08/21 1210  Weight: 49.9 kg    Examination:  General exam: NAD Respiratory system: CTA Cardiovascular system: S 1, S 2 RRR Gastrointestinal system: BS present, soft, nt Central nervous system: Generalized weakness Extremities: Symmetric power. Bruise finger after fall. Less swelling finger,    Data Reviewed: I have personally reviewed following labs and imaging studies  CBC: Recent Labs  Lab 02/11/21 0346 02/12/21 0949 02/13/21 0437 02/14/21 0852 02/15/21 0433  WBC 5.4 8.3 6.5 5.5 6.4  HGB 8.3* 9.4* 8.4* 8.5* 8.0*  HCT 24.2* 26.8* 24.2* 24.8* 23.2*  MCV 104.8* 103.1* 102.5* 103.8* 104.0*  PLT 33* 34* 32* 32* 35*    Basic Metabolic Panel: Recent Labs  Lab 02/08/21 2150 02/09/21 0334 02/10/21 0809 02/11/21 0346 02/13/21 0437 02/14/21 0852 02/15/21 0906  NA  --    < > 136 134* 133* 135 136  K  --    < > 3.3* 4.0 3.0* 3.3* 3.6  CL  --    < > 103 103 101 104 105  CO2  --    < > 23 26 24 26 26  GLUCOSE  --    < > 112* 115* 109* 110* 110*  BUN  --    < > <5* <5* <5* <5* <5*  CREATININE  --    < > 0.45 0.49 0.52 0.43* 0.41*  CALCIUM  --    < > 8.1* 8.1* 8.4* 8.6* 8.5*  MG 2.1  --   --   --  1.6*  --  2.0   < > = values in this interval not displayed.    GFR: Estimated Creatinine Clearance: 57.1 mL/min (A) (by C-G formula based on SCr of 0.41 mg/dL (L)). Liver Function Tests: Recent Labs  Lab 02/08/21 2150 02/09/21 0334  AST  --  50*  ALT  --  28  ALKPHOS  --  69  BILITOT 1.1 0.8  PROT  --  6.2*  ALBUMIN  --  3.0*    No results for input(s): LIPASE, AMYLASE in the last 168 hours.  No results for input(s): AMMONIA in the last 168 hours. Coagulation Profile: No results for input(s): INR, PROTIME in the last 168 hours. Cardiac  Enzymes: No results for input(s): CKTOTAL, CKMB, CKMBINDEX, TROPONINI in the last 168 hours. BNP (last 3 results) No results for input(s): PROBNP in the last 8760 hours. HbA1C: No results for input(s): HGBA1C in the last 72 hours. CBG: No results for input(s): GLUCAP in the last 168 hours. Lipid Profile: No results for input(s): CHOL, HDL, LDLCALC, TRIG, CHOLHDL, LDLDIRECT in the last 72 hours. Thyroid Function Tests: No results for input(s): TSH, T4TOTAL, FREET4, T3FREE, THYROIDAB in the last 72 hours.  Anemia Panel: No results for input(s): VITAMINB12, FOLATE, FERRITIN, TIBC, IRON, RETICCTPCT in the last 72 hours.  Sepsis   Labs: No results for input(s): PROCALCITON, LATICACIDVEN in the last 168 hours.  Recent Results (from the past 240 hour(s))  Urine culture     Status: Abnormal   Collection Time: 02/08/21 12:41 PM   Specimen: Urine, Random  Result Value Ref Range Status   Specimen Description   Final    URINE, RANDOM Performed at [facility], [address] [place], [place] [address]    Special Requests   Final    NONE Performed at [facility], [address] [place], [place] [address]    Culture >=100,000 COLONIES/mL ESCHERICHIA COLI (A)  Final   Report Status 02/10/2021 FINAL  Final   Organism ID, Bacteria ESCHERICHIA COLI (A)  Final      Susceptibility   Escherichia coli - MIC*    AMPICILLIN >=32 RESISTANT Resistant     CEFAZOLIN <=4 SENSITIVE Sensitive     CEFEPIME <=0.12 SENSITIVE Sensitive     CEFTRIAXONE <=0.25 SENSITIVE Sensitive     CIPROFLOXACIN <=0.25 SENSITIVE Sensitive     GENTAMICIN <=1 SENSITIVE Sensitive     IMIPENEM <=0.25 SENSITIVE Sensitive     NITROFURANTOIN <=16 SENSITIVE Sensitive     TRIMETH/SULFA <=20 SENSITIVE Sensitive     AMPICILLIN/SULBACTAM 4 SENSITIVE Sensitive     PIP/TAZO <=4 SENSITIVE Sensitive     * >=100,000 COLONIES/mL ESCHERICHIA COLI  Resp Panel by RT-PCR (Flu A&B, Covid) Nasopharyngeal  Swab     Status: None   Collection Time: 02/08/21 12:53 PM   Specimen: Nasopharyngeal Swab; Nasopharyngeal(NP) swabs in vial transport medium  Result Value Ref Range Status   SARS Coronavirus 2 by RT PCR NEGATIVE NEGATIVE Final    Comment: (NOTE) SARS-CoV-2 target nucleic acids are NOT DETECTED.  The SARS-CoV-2 RNA is generally detectable in upper respiratory specimens during the acute phase of infection. The lowest concentration of SARS-CoV-2 viral copies this assay can detect is 138 copies/mL. A negative result does not preclude SARS-Cov-2 infection and should not be used as the sole basis for treatment or other patient management decisions. A negative result may occur with  improper specimen collection/handling, submission of specimen other than nasopharyngeal swab, presence of viral mutation(s) within the areas targeted by this assay, and inadequate number of viral copies(<138 copies/mL). A negative result must be combined with clinical observations, patient history, and epidemiological information. The expected result is Negative.  Fact Sheet for Patients:  https://www.fda.gov/media/152166/download  Fact Sheet for Healthcare Providers:  https://www.fda.gov/media/152162/download  This test is no t yet approved or cleared by the [place] FDA and  has been authorized for detection and/or diagnosis of SARS-CoV-2 by FDA under an Emergency Use Authorization (EUA). This EUA will remain  in effect (meaning this test can be used) for the duration of the COVID-19 declaration under Section 564(b)(1) of the Act, 21 U.S.C.section 360bbb-3(b)(1), unless the authorization is terminated  or revoked sooner.       Influenza A by PCR NEGATIVE NEGATIVE Final   Influenza B by PCR NEGATIVE NEGATIVE Final    Comment: (NOTE) The Xpert Xpress SARS-CoV-2/FLU/RSV plus assay is intended as an aid in the diagnosis of influenza from Nasopharyngeal swab specimens and should not be used as a sole  basis for treatment. Nasal washings and aspirates are unacceptable for Xpert Xpress SARS-CoV-2/FLU/RSV testing.  Fact Sheet for Patients: https://www.fda.gov/media/152166/download  Fact Sheet for Healthcare Providers: https://www.fda.gov/media/152162/download  This test is not yet approved or cleared by the [place] FDA and has been authorized for detection and/or diagnosis of   SARS-CoV-2 by FDA under an Emergency Use Authorization (EUA). This EUA will remain in effect (meaning this test can be used) for the duration of the COVID-19 declaration under Section 564(b)(1) of the Act, 21 U.S.C. section 360bbb-3(b)(1), unless the authorization is terminated or revoked.  Performed at [facility], [address] [place], [place] [address]   C Difficile Quick Screen w PCR reflex     Status: None   Collection Time: 02/09/21 11:10 PM   Specimen: STOOL  Result Value Ref Range Status   C Diff antigen NEGATIVE NEGATIVE Final   C Diff toxin NEGATIVE NEGATIVE Final   C Diff interpretation No C. difficile detected.  Final    Comment: Performed at [facility], [address] [place], [place] [address]          Radiology Studies: MR CERVICAL SPINE W WO CONTRAST  Result Date: 02/13/2021 CLINICAL DATA:  Myelopathy EXAM: MRI CERVICAL SPINE WITHOUT AND WITH CONTRAST TECHNIQUE: Multiplanar and multiecho pulse sequences of the cervical spine, to include the craniocervical junction and cervicothoracic junction, were obtained without and with intravenous contrast. CONTRAST:  4mL GADAVIST GADOBUTROL 1 MMOL/ML IV SOLN COMPARISON:  None. FINDINGS: Alignment: Grade 1 retrolisthesis at C5-6 Vertebrae: No fracture, evidence of discitis, or bone lesion. Cord: Normal signal and morphology. No abnormal contrast enhancement. Posterior Fossa, vertebral arteries, paraspinal tissues: Negative. Disc levels: C1-C2: Normal. C2-C3: Normal disc space and facets. No spinal  canal or neuroforaminal stenosis. C3-C4: Moderate right facet hypertrophy. No spinal canal or neural foraminal stenosis. C4-C5: Normal disc space and facets. No spinal canal or neuroforaminal stenosis. C5-C6: Small disc bulge with bilateral uncovertebral osteophytes. Moderate right foraminal stenosis. No spinal canal stenosis. C6-C7: Small central disc protrusion without spinal canal or neural foraminal stenosis. C7-T1: Normal disc space and facets. No spinal canal or neuroforaminal stenosis. IMPRESSION: 1. No spinal canal stenosis. 2. Moderate right C5-6 neural foraminal stenosis due to uncovertebral osteophytes. 3. Small central disc protrusion at C6-C7 without spinal canal or neural foraminal stenosis. Electronically Signed   By: Kevin  Herman M.D.   On: 02/13/2021 21:05   MR THORACIC SPINE W WO CONTRAST  Result Date: 02/13/2021 CLINICAL DATA:  Myelopathy EXAM: MRI THORACIC WITHOUT AND WITH CONTRAST TECHNIQUE: Multiplanar and multiecho pulse sequences of the thoracic spine were obtained without and with intravenous contrast. CONTRAST:  4mL GADAVIST GADOBUTROL 1 MMOL/ML IV SOLN COMPARISON:  None. FINDINGS: Alignment:  Physiologic. Vertebrae: No fracture, evidence of discitis, or bone lesion. Cord: Normal signal and morphology. No abnormal contrast enhancement Paraspinal and other soft tissues: Negative Disc levels: There is a small central disc protrusion at T10-11 without spinal canal or neural foraminal stenosis. The other disc levels are normal. IMPRESSION: 1. No thoracic spinal canal or neural foraminal stenosis. 2. Small central disc protrusion at T10-11 without spinal canal or neural foraminal stenosis. Electronically Signed   By: Kevin  Herman M.D.   On: 02/13/2021 21:11        Scheduled Meds:  calcium carbonate  1 tablet Oral BID WC   folic acid  1 mg Oral Daily   metoprolol tartrate  12.5 mg Oral BID   pantoprazole  40 mg Oral BID   potassium chloride  20 mEq Oral Daily   sucralfate  1 g  Oral TID WC & HS   thiamine injection  100 mg Intravenous Daily   vitamin B-12  2,000 mcg Oral Daily   Continuous Infusions:  sodium chloride 100 mL/hr at 02/15/21 1113        LOS: 7 days    Time spent: 35 minutes.     Belkys A Regalado, MD [facility]   If 7PM-7AM, please contact night-coverage [email or web address]  02/15/2021, 3:16 PM   

## 2021-02-15 NOTE — Telephone Encounter (Signed)
Per 6/16 sch msg, pt will recive print out when discharged

## 2021-02-15 NOTE — Procedures (Signed)
Technically successful L3-L4 fluoroscopically guided lumbar puncture.   16 mL of CSF obtained for laboratory studies.   No immediate post-procedure complication.

## 2021-02-15 NOTE — Progress Notes (Signed)
Occupational Therapy Treatment Patient Details Name: [name] [name] MRN: [id number] DOB: 1961-06-17 Today's Date: 02/15/2021    History of present illness 60 year old with no significant past medical history who presents complaining of nausea vomiting and vision changes and weakness.  She reports intermittent nausea and vomiting for the last 3 months.  Reports intermittent blurry vision. She reports worsening numbness and tingling in her hands and feet. She feels weak all over.  Pt found to be hypokalemic and with UTI. MRI revealed: 1. No acute infarct.  2. Ventriculomegaly, which may be ex vacuo in the setting of  moderate atrophy. Normal pressure hydrocephalus could have a similar  appearance in the correct clinical setting.  3. Mild to moderate T2/FLAIR hyperintensities within the white  matter. This finding is nonspecific but can be seen in the setting  of chronic microvascular ischemia, a demyelinating process such as multiple sclerosis, chronic migraines, or as the sequelae of a prior  infectious or inflammatory process.   OT comments  Co-treat with PT today as patient has a significantly high fall risk - and has fallen twice while in hospital per RN. OT treatment focused on patient leaning forward to reduce posterior lean and hitting targets with bilateral upper extremities. Patient's Legs ataxic even at rest and patient unable to keep feet on floor. Patient reports difficulty holding onto objects and increased difficulty with feeding and writing due to impaired sensation. Therapist provided patient with built up handles for pen and utensils to improve ability to grip. Patient exhibited difficulty holding onto comb and exhibiting the strength needed to pull comb through tangled hair - needing some assistance. Patient's functional mobility has declined - requiring +2 to stand. Patient's legs buckle without warning. Continue to recommend aggressive short term rehab at discharge.   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 min/guard for safety on return to supine, pt with uncontrolled ataxic movments bil LEs    Transfers Overall transfer level: Needs assistance Equipment used: 2 person hand held assist Transfers: Sit to/from Stand Sit to Stand: Min assist;Mod assist;+2 physical assistance;+2 safety/equipment Stand pivot transfers: Total assist       General transfer comment: reapeated sit to stand transfers with bil knee support, tactile input through feet,  bil HHA. assist with anterior-superior wt shift and to bring COG over BOS. pt with significant retropulsion in standing; pt with one episode of bil knee buckling requiring +2 max assist to maintain upright    Balance Overall balance assessment: History of Falls;Needs assistance Sitting-balance support: Single extremity supported;Bilateral upper extremity supported;Feet supported;Feet unsupported Sitting balance-Leahy Scale: Poor Sitting balance - Comments: pt with posterior bias in sitting, ballistic movements LEs in sitting; min-guard to heavy min assist to maintain midline   Standing balance support: Bilateral upper extremity supported Standing balance-Leahy Scale: Poor Standing balance comment: reliant on UE support,  and external assist  ADL either performed or assessed with clinical judgement   ADL Overall ADL's : Needs assistance/impaired Eating/Feeding: Modified  independent Eating/Feeding Details (indicate cue type and reason): reports she has been feeding herself - though it has been difficult. Therapist provided patient with built up handled for self feeding. Patient provided with mug and straw to reduce likeliness of spilling cup.                 Lower Body Dressing: Bed level;Total assistance Lower Body Dressing Details (indicate cue type and reason): total assist to don tennis shoes.                     Vision   Additional Comments: Patient seen by ophthalmologist who diagosed paient with mild cataracts. Exhibits low vision deficits -with difficulty finding comb in her purse.   Perception     Praxis      Cognition Arousal/Alertness: Awake/alert Behavior During Therapy: WFL for tasks assessed/performed Overall Cognitive Status: Within Functional Limits for tasks assessed                                          Exercises General Exercises - Lower Extremity Long Arc Quad: AROM;Both;5 reps Other Exercises Other Exercises: leaning forward to tap hands with each arm - working on controling UE movement and promoting anterior lean.   Shoulder Instructions       General Comments      Pertinent Vitals/ Pain       Pain Assessment: No/denies pain Faces Pain Scale: Hurts even more Pain Location: hands, feet Pain Descriptors / Indicators: Discomfort;Grimacing;Guarding;Pins and needles;Heaviness Pain Intervention(s): Limited activity within patient's tolerance;Monitored during session;Repositioned  Home Living                                          Prior Functioning/Environment              Frequency  Min 2X/week        Progress Toward Goals  OT Goals(current goals can now be found in the care plan section)  Progress towards OT goals: Progressing toward goals  Acute Rehab OT Goals Patient Stated Goal: regain independence OT Goal Formulation: With patient Time For Goal  Achievement: 02/24/21 Potential to Achieve Goals: Good  Plan Discharge plan remains appropriate    Co-evaluation    PT/OT/SLP Co-Evaluation/Treatment: Yes Reason for Co-Treatment: Complexity of the patient's impairments (multi-system involvement);For patient/therapist safety;To address functional/ADL transfers   OT goals addressed during session: Proper use of Adaptive equipment and DME      AM-PAC OT "6 Clicks" Daily Activity     Outcome Measure   Help from another person eating meals?: None Help from another person taking care of personal grooming?: A Little Help from another person toileting, which includes using toliet, bedpan, or urinal?: A Lot Help from another person bathing (including washing, rinsing, drying)?: A Little Help from another person to put on and taking off regular upper body clothing?: A Little Help from another person to put on and taking off regular lower body clothing?: Total 6 Click Score: 16    End of Session    OT Visit Diagnosis: Unsteadiness on feet (R26.81);Other abnormalities of gait and mobility (R26.89);Other symptoms and signs involving the nervous system (R29.898);History of falling (  Z91.81);Repeated falls (R29.6);Low vision, both eyes (H54.2)   Activity Tolerance Patient tolerated treatment well   Patient Left in bed;with call bell/phone within reach;with bed alarm set   Nurse Communication Mobility status        Time: 1102-1117 OT Time Calculation (min): 39 min  Charges: OT General Charges $OT Visit: 1 Visit OT Treatments $Self Care/Home Management : 8-22 mins  Waldron Session, OTR/L Acute Care Rehab Services  Office [phone] Pager: [phone]    Kelli Churn 02/15/2021, 5:21 PM

## 2021-02-16 DIAGNOSIS — D696 Thrombocytopenia, unspecified: Secondary | ICD-10-CM

## 2021-02-16 DIAGNOSIS — D520 Dietary folate deficiency anemia: Secondary | ICD-10-CM

## 2021-02-16 LAB — CBC
HCT: 25.1 % — ABNORMAL LOW (ref 36.0–46.0)
Hemoglobin: 8.3 g/dL — ABNORMAL LOW (ref 12.0–15.0)
MCH: 34.7 pg — ABNORMAL HIGH (ref 26.0–34.0)
MCHC: 33.1 g/dL (ref 30.0–36.0)
MCV: 105 fL — ABNORMAL HIGH (ref 80.0–100.0)
Platelets: 61 10*3/uL — ABNORMAL LOW (ref 150–400)
RBC: 2.39 MIL/uL — ABNORMAL LOW (ref 3.87–5.11)
RDW: 16.8 % — ABNORMAL HIGH (ref 11.5–15.5)
WBC: 5.9 10*3/uL (ref 4.0–10.5)
nRBC: 0.3 % — ABNORMAL HIGH (ref 0.0–0.2)

## 2021-02-16 LAB — PREPARE PLATELET PHERESIS
Unit division: 0
Unit division: 0

## 2021-02-16 LAB — BASIC METABOLIC PANEL
Anion gap: 6 (ref 5–15)
BUN: <5 mg/dL — ABNORMAL LOW (ref 6–20)
CO2: 25 mmol/L (ref 22–32)
Calcium: 8.5 mg/dL — ABNORMAL LOW (ref 8.9–10.3)
Chloride: 107 mmol/L (ref 98–111)
Creatinine, Ser: 0.4 mg/dL — ABNORMAL LOW (ref 0.44–1.00)
GFR, Estimated: >60 mL/min (ref >60)
Glucose, Bld: 100 mg/dL — ABNORMAL HIGH (ref 70–99)
Potassium: 3.3 mmol/L — ABNORMAL LOW (ref 3.5–5.1)
Sodium: 138 mmol/L (ref 135–145)

## 2021-02-16 LAB — BPAM PLATELET PHERESIS
Blood Product Expiration Date: 202206202359
Blood Product Expiration Date: 202206222359
ISSUE DATE / TIME: 202206201218
ISSUE DATE / TIME: 202206201446
Unit Type and Rh: 5100
Unit Type and Rh: 7300

## 2021-02-16 LAB — COPPER, SERUM: Copper: 108 ug/dL (ref 80–158)

## 2021-02-16 LAB — MAGNESIUM: Magnesium: 1.8 mg/dL (ref 1.7–2.4)

## 2021-02-16 MED ORDER — POTASSIUM CHLORIDE CRYS ER 20 MEQ PO TBCR
40.0000 meq | EXTENDED_RELEASE_TABLET | Freq: Once | ORAL | Status: AC
  Administered 2021-02-16: 40 meq via ORAL
  Filled 2021-02-16: qty 2

## 2021-02-16 MED ORDER — POTASSIUM CHLORIDE 20 MEQ PO PACK
40.0000 meq | PACK | Freq: Every day | ORAL | Status: DC
  Administered 2021-02-16 – 2021-02-27 (×12): 40 meq via ORAL
  Filled 2021-02-16 (×12): qty 2

## 2021-02-16 MED ORDER — ADULT MULTIVITAMIN W/MINERALS CH
1.0000 | ORAL_TABLET | Freq: Every day | ORAL | Status: DC
  Administered 2021-02-16 – 2021-02-27 (×12): 1 via ORAL
  Filled 2021-02-16 (×12): qty 1

## 2021-02-16 MED ORDER — LACTATED RINGERS IV BOLUS
500.0000 mL | Freq: Once | INTRAVENOUS | Status: AC
  Administered 2021-02-16: 500 mL via INTRAVENOUS

## 2021-02-16 MED ORDER — MAGNESIUM OXIDE -MG SUPPLEMENT 400 (240 MG) MG PO TABS
200.0000 mg | ORAL_TABLET | Freq: Two times a day (BID) | ORAL | Status: DC
  Administered 2021-02-16 – 2021-02-27 (×23): 200 mg via ORAL
  Filled 2021-02-16 (×22): qty 1

## 2021-02-16 MED ORDER — IMMUNE GLOBULIN (HUMAN) 10 GM/100ML IV SOLN
400.0000 mg/kg | Freq: Every day | INTRAVENOUS | Status: AC
  Administered 2021-02-16 – 2021-02-20 (×5): 20 g via INTRAVENOUS
  Filled 2021-02-16 (×5): qty 200

## 2021-02-16 MED ORDER — POLYETHYLENE GLYCOL 3350 17 G PO PACK
17.0000 g | PACK | Freq: Every day | ORAL | Status: DC
  Administered 2021-02-16 – 2021-02-27 (×7): 17 g via ORAL
  Filled 2021-02-16 (×9): qty 1

## 2021-02-16 NOTE — Progress Notes (Signed)
Resuming care of patient from [place], [place]. Agree with her previous assessment. Pt currently resting in bed with no needs at this time. Will continue to monitor patient.

## 2021-02-16 NOTE — Progress Notes (Addendum)
Neurology Progress Note  S: States she got up with assistance of staff x1 and walker and fell. Did not strike her head. She hit her right ring finger and now it is sore and bruised. She can move it at all joints, but it causes discomfort. She says she might feel a tiny bit better. Still weaker in LEs.   O: Current vital signs: BP (!) 153/97 (BP Location: Right Arm)   Pulse 83   Temp 97.8 F (36.6 C)   Resp 19   Ht 5\' 1"  (1.549 m)   Wt 49.9 kg   LMP  (LMP Unknown) Comment: Last menstrual period was 8-9 years ago  SpO2 100%   BMI 20.78 kg/m  Vital signs in last 24 hours: Temp:  [97.8 F (36.6 C)-98.6 F (37 C)] 97.8 F (36.6 C) (06/21 0458) Pulse Rate:  [80-131] 83 (06/21 0458) Resp:  [14-19] 19 (06/21 0458) BP: (131-153)/(93-109) 153/97 (06/21 0458) SpO2:  [97 %-100 %] 100 % (06/21 0458)  GENERAL: Awake, alert in NAD. HEENT: Normocephalic and atraumatic LUNGS: Normal respiratory effort.  CV: RRR. Ext: warm  NEURO:  Mental Status: AA&Ox3  Speech/Language: speech is without aphasia or dysarthria.  Naming, repetition, fluency, and comprehension intact.  Cranial Nerves:  II: PERRL. Visual fields full.  III, IV, VI: EOMI. Eyelids elevate symmetrically.  V: Sensation is intact to light touch and symmetrical to face.  VII: Smile is symmetrical. Able to puff cheeks and raise eyebrows.  VIII: hearing intact to voice. IX, X: Palate elevates symmetrically. Phonation is normal.  11-24-1995 shrug 5/5. XII: tongue is midline without fasciculations. Motor: 4+/5 to upper extremities except grip on right is 4/5 due to the pain with her ring finger injury.  Tone is normal, bulk is decreased.  Sensation- Intact to light touch bilaterally. Extinction absent to light touch to DSS.    Coordination: No ataxia noted to LEs.  DTRs: 1+ brachioradialis, 0 patellas.  Gait- deferred  Medications  Current Facility-Administered Medications:    0.9 %  sodium chloride infusion, , Intravenous,  Continuous, Regalado, Belkys A, MD, Last Rate: 100 mL/hr at 02/16/21 0255, New Bag at 02/16/21 0255   acetaminophen (TYLENOL) tablet 650 mg, 650 mg, Oral, Q6H PRN, 650 mg at 02/11/21 0658 **OR** acetaminophen (TYLENOL) suppository 650 mg, 650 mg, Rectal, Q6H PRN, 02/13/21, Tyrone A, DO   artificial tears (LACRILUBE) ophthalmic ointment, , Both Eyes, Q4H PRN, Regalado, Belkys A, MD   calcium carbonate (OS-CAL - dosed in mg of elemental calcium) tablet 500 mg of elemental calcium, 1 tablet, Oral, BID WC, Regalado, Belkys A, MD, 500 mg of elemental calcium at 02/16/21 0810   feeding supplement (ENSURE ENLIVE / ENSURE PLUS) liquid 237 mL, 237 mL, Oral, BID BM, Regalado, Belkys A, MD, 237 mL at 02/16/21 0944   folic acid (FOLVITE) tablet 1 mg, 1 mg, Oral, Daily, 02/18/21 M, RPH, 1 mg at 02/16/21 02/18/21   hydrALAZINE (APRESOLINE) tablet 25 mg, 25 mg, Oral, Q8H PRN, Regalado, Belkys A, MD   LORazepam (ATIVAN) tablet 0.5 mg, 0.5 mg, Oral, Q6H PRN, 6213, Tyrone A, DO, 0.5 mg at 02/15/21 1210   metoprolol tartrate (LOPRESSOR) tablet 12.5 mg, 12.5 mg, Oral, BID, Regalado, Belkys A, MD, 12.5 mg at 02/16/21 0943   multivitamin with minerals tablet 1 tablet, 1 tablet, Oral, Daily, 02/18/21, MD, 1 tablet at 02/16/21 0944   pantoprazole (PROTONIX) EC tablet 40 mg, 40 mg, Oral, BID, 02/18/21, RPH, 40 mg at 02/16/21 [phone]  polyethylene glycol (MIRALAX / GLYCOLAX) packet 17 g, 17 g, Oral, Daily, Regalado, Belkys A, MD, 17 g at 02/16/21 0943   potassium chloride (KLOR-CON) packet 40 mEq, 40 mEq, Oral, Daily, Regalado, Belkys A, MD, 40 mEq at 02/16/21 0944   potassium chloride SA (KLOR-CON) CR tablet 40 mEq, 40 mEq, Oral, Once, Regalado, Belkys A, MD   sucralfate (CARAFATE) 1 GM/10ML suspension 1 g, 1 g, Oral, TID WC & HS, Regalado, Belkys A, MD, 1 g at 02/16/21 0810   thiamine (B-1) injection 100 mg, 100 mg, Intravenous, Daily, Regalado, Belkys A, MD, 100 mg at 02/16/21 0943   traMADol (ULTRAM) tablet 50  mg, 50 mg, Oral, Q6H PRN, Regalado, Belkys A, MD, 50 mg at 02/16/21 0040   vitamin B-12 (CYANOCOBALAMIN) tablet 2,000 mcg, 2,000 mcg, Oral, Daily, Caryl Pina, MD, 2,000 mcg at 02/16/21 0945  Pertinent Labs Vitamin B12 374. Folate 1.8. TSH 1.081. Hgb 8.3. Copper 108.  Homocysteine 26. HIV NR. Urine cx + Ecoli.   Results for JANKI, DIKE (MRN [id number]) as of 02/16/2021 15:15  Ref. Range 02/15/2021 17:54  Appearance, CSF Latest Ref Range: CLEAR  CLEAR  Glucose, CSF Latest Ref Range: 40 - 70 mg/dL 63  RBC Count, CSF Latest Ref Range: 0 /cu mm 1 (H)  WBC, CSF Latest Ref Range: 0 - 5 /cu mm 1  Other Cells, CSF Unknown TOO FEW TO COUNT, SMEAR AVAILABLE FOR REVIEW  Color, CSF Latest Ref Range: COLORLESS  COLORLESS  Supernatant Unknown NOT INDICATED  Total  Protein, CSF Latest Ref Range: 15 - 45 mg/dL 38   Imaging MD has reviewed images in epic and the results pertinent to this consultation are:  MRI Tspine:  1. No thoracic spinal canal or neural foraminal stenosis. 2. Small central disc protrusion at T10-11 without spinal canal or neural foraminal stenosis.  MRI Cspine:  1. No spinal canal stenosis. 2. Moderate right C5-6 neural foraminal stenosis due to uncovertebral osteophytes. 3. Small central disc protrusion at C6-C7 without spinal canal or neural foraminal stenosis.  Assessment: 60 yo female who presented 8 days ago for n/v, vision changes, and weakness. She had multiple metabolic derangements on admission and several vitamin deficiencies. + UTI. We are still awaiting some lab studies. LP is unrevealing. MRI of C and Tspine show no findings consistent with transverse myelitis or demyelinating disease. 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 However, CIDP is also on the differential and after Dr. Ezzie Dural spoke to patient about risks and benefits, patient elected to have IVIG. Steroids are not useful for this  presentation. Her nutritional deficiency could be playing a role.   Plan:  -5 days of IVIG and follow results.  -Continue Vit B12 supplementation.  -Continue MVI, Thiamine, and Folate.  -Continue to replete metabolic derangements.    Pt seen by Jimmye Norman, MSN, APN-BC/Nurse Practitioner/Neuro and later by MD. Note and plan to be edited as needed by MD.  Pager: [phone]  NEUROHOSPITALIST ADDENDUM Performed a face to face diagnostic evaluation.   I have reviewed the contents of history and physical exam as documented by PA/ARNP/Resident and agree with above documentation.  I have discussed and formulated the above plan as documented. Edits to the note have been made as needed.  Impression/Key exam findings/Plan: 56F with roughly 2.5 month progressive paresthesias and hyperasthesia in stocking and glove distribution. Appears cachectic. Had low normal B12 and low folates levels. I do think that  there is a nutritional component to her presentation. Had further workup with spine imaging and was negative. LP is benign with no white cells, no elevated protein. OCBs and IgG index is still pending. I do not think this is AIDP, may potentially be CIDP vs nutritional. I had a discussion with her in detail about her presentation. She has had no improvement in her symptoms in the hospital, infact believes that they are a little worse despite electrolyte replacement and multivitamins. I also discussed that I am not entirely sure if this is potential CIDP vs nutritional but I do think that it is worth treating with IVIG. She understands the risks and benefits and agrees with IVIG for 5 days.  Erick Blinks, MD [facility] [phone]   If 7pm to 7am, please call on call as listed on AMION.

## 2021-02-16 NOTE — Progress Notes (Signed)
PROGRESS NOTE    [name] [name]  RWE:[id number] DOB: 1961/06/17 DOA: 02/08/2021 PCP: Pcp, No   Brief Narrative: 60 year old with no significant past medical history who presents complaining of nausea vomiting and vision changes and weakness.  She reports intermittent nausea and vomiting for the last 3 months.  Reports intermittent blurry vision.  She denies headache.  She reports worsening numbness and tingling in her hands and feet.  She feels weak all over.  Evaluation in the ED: Patient was found to have a potassium of 2.4.  UA consistent with UTI.  Patient was admitted with severe hypokalemia and UTI.  She was treated with antibiotics.  Electrolytes were repleted.  She was also found to have anemia and thrombocytopenia, for which she underwent bone marrow biopsy with nonspecific findings.  Additional studies are pending including FISH for MDS and cytogenetics.  She will need to follow-up with oncology as an outpatient.  Initially her overall weakness improved, subsequently she reported worsening of lower extremity weakness.  She underwent MRI which was negative for stroke, ventriculomegaly and chronic microvascular ischemia.  Neurology was consulted.  She is underwent MRI of cervical and thoracic spine unrevealing.  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  Patient underwent lumbar puncture on 02/23/2021  Neurology will follow-up on patient today and is considering IVIG versus Plex.    Assessment & Plan:   Active Problems:   Hypokalemia   Lower urinary tract infectious disease   Thrombocytopenia (HCC)   Dietary folate deficiency anemia  1-Extremities weakness:  Lower extremities weakness worse than upper extremities weakness.   -Numbness and tingling stable.  -Started Y19 and folic acid.  -MRI negative for stroke. Discussed with neurology MRI finding are non specific, appears more related to chronic vascular diseases than MS.   -Continue to complain of LE weakness, feels weakness is worse. I have consulted neurology 6/18. -MRI cervical and thoracic unrevealing.  -Underwent LP 6/21. 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  Labs Pending; Anti- GM1, MMA, Homocysteine 26, Thiamine level, copper, vitamin E; B 6 CSF; IgG index, Oligoclonal bands, VDRL Pending.   Hypokalemia, Hyponatremia, Hypocalcemia: -Patient received IV potassium and oral potassium. Received calcium supplement. Started oral calcium Replete mg and potasium daily   2-Anemia; Thrombocytopenia;  Folic acid low. Replete IV.  Thrombocytopenia could de related to infection, HO alcohol use.  Peripheral smear. Macrocytic anemia, thrombocytopenia.  Dr Julien Nordmann recommend Bone marrow biopsy. Underwent Bone marrow biopsy 6/16; Non Monoclonal B cell or phenotypically aberrant T cell Population identified. 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   3-Epigastric pain;  CT abdomen ; possible gastritis, lipase negative Started PPI and Carafate.  Might need GI follow up. Endoscopy/. Patient decline inpatient evaluation.  Tramadol PRN.  C diff negative.    5-UTI; Completed 5 days ceftriaxone.  Follow urine culture. Growing E coli.   6-Mild transaminases; CT hepatic steatosis. Hepatomegaly.   7-HTN; Started on metoprolol.  Fall, denies hitting head. Has small hematoma second finger right hands. Plan to apply ice, keep elevated. Decline x ray at this time.  8-Blurry vision, decrease vision right Eye;  Ophthalmology consulted. Patient diagnose with dry eye, glaucoma. Follow up out patient.   9-Tachycardia; sinus. IV bolus, IV fluids.  Started low dose BB>   Estimated body mass index is 20.78 kg/m as  calculated from the following:   Height as of this encounter: 5' 1"  (1.549 m).   Weight as of this encounter: 49.9 kg.   DVT prophylaxis: SCD Code Status: Partial Code; no intubation  Family Communication: Care discussed with patient. Sister updated 6/17. Patient politely decline that I call her sister today for updates. Disposition Plan:  Status is: Inpatient  Remains inpatient appropriate because:IV treatments appropriate due to intensity of illness or inability to take PO  Dispo: The patient is from:               Anticipated d/c is to: Home              Patient currently is not medically stable to d/c.   Difficult to place patient No        Consultants:  Neurology phone   Procedures:  None  Antimicrobials:    Subjective: She report weakness is the same.  No BM.  Denies dyspnea.   Objective: Vitals:   02/15/21 1510 02/15/21 1701 02/15/21 2058 02/16/21 0458  BP: (!) 133/93 (!) 149/109 (!) 143/97 (!) 153/97  Pulse: 88 (!) 131 88 83  Resp: _0 Temp: 98.4 F (36.9 C) 98.3 F (36.8 C) 98.4 F (36.9 C) 97.8 F (36.6 C)  TempSrc: Oral Oral Oral   SpO2: 97% 100% 99% 100%  Weight:      Height:        Intake/Output Summary (Last 24 hours) at 02/16/2021 1334 Last data filed at 02/16/2021 0800 Gross per 24 hour  Intake 2949.72 ml  Output 1600 ml  Net 1349.72 ml    Filed Weights   02/08/21 1210  Weight: 49.9 kg    Examination:  General exam: NAD Respiratory system: CTA Cardiovascular system: S 1, S 2 RRR Gastrointestinal system: BS present, soft, nt Central nervous system: alert, follows command, upper extremities 4/5, lower 4/5 Extremities: Symmetric power. Bruise finger after fall. Less swelling finger,    Data Reviewed: I have personally reviewed following labs and imaging studies  CBC: Recent Labs  Lab 02/13/21 0437 02/14/21 0852 02/15/21 0433 02/15/21 2042 02/16/21 0424  WBC 6.5 5.5 6.4 7.7 5.9  NEUTROABS  --   --   --  5.2  --   HGB 8.4* 8.5* 8.0* 8.4* 8.3*  HCT 24.2* 24.8* 23.2* 24.5* 25.1*   MCV 102.5* 103.8* 104.0* 104.3* 105.0*  PLT 32* 32* 35* 60* 61*    Basic Metabolic Panel: Recent Labs  Lab 02/11/21 0346 02/13/21 0437 02/14/21 0852 02/15/21 0906 02/16/21 0424  NA 134* 133* 135 136 138  K 4.0 3.0* 3.3* 3.6 3.3*  CL 103 101 104 105 107  CO2 _1 GLUCOSE 115* 109* 110* 110* 100*  BUN <5* <5* <5* <5* <5*  CREATININE 0.49 0.52 0.43* 0.41* 0.40*  CALCIUM 8.1* 8.4* 8.6* 8.5* 8.5*  MG  --  1.6*  --  2.0 1.8    GFR: Estimated Creatinine Clearance: 57.1 mL/min (A) (by C-G formula based on SCr of 0.4 mg/dL (L)). Liver Function Tests: No results for input(s): AST, ALT, ALKPHOS, BILITOT, PROT, ALBUMIN in the last 168 hours.  No results for input(s): LIPASE, AMYLASE in the last 168 hours.  No results for input(s): AMMONIA in the last 168 hours. Coagulation Profile: No results for input(s): INR, PROTIME in the last 168 hours. Cardiac Enzymes: No results for input(s): CKTOTAL, CKMB, CKMBINDEX, TROPONINI in the last 168 hours. BNP (last 3 results) No results  for input(s): PROBNP in the last 8760 hours. HbA1C: No results for input(s): HGBA1C in the last 72 hours. CBG: No results for input(s): GLUCAP in the last 168 hours. Lipid Profile: No results for input(s): CHOL, HDL, LDLCALC, TRIG, CHOLHDL, LDLDIRECT in the last 72 hours. Thyroid Function Tests: No results for input(s): TSH, T4TOTAL, FREET4, T3FREE, THYROIDAB in the last 72 hours.  Anemia Panel: No results for input(s): VITAMINB12, FOLATE, FERRITIN, TIBC, IRON, RETICCTPCT in the last 72 hours.  Sepsis Labs: No results for input(s): PROCALCITON, LATICACIDVEN in the last 168 hours.  Recent Results (from the past 240 hour(s))  Urine culture     Status: Abnormal   Collection Time: 02/08/21 12:41 PM   Specimen: Urine, Random  Result Value Ref Range Status   Specimen Description   Final    URINE, RANDOM Performed at [facility] [address], [place], [place] [address]     Special Requests   Final    NONE Performed at [facility], [place] [address], [place], [place] [address]    Culture >=100,000 COLONIES/mL ESCHERICHIA COLI (A)  Final   Report Status 02/10/2021 FINAL  Final   Organism ID, Bacteria ESCHERICHIA COLI (A)  Final      Susceptibility   Escherichia coli - MIC*    AMPICILLIN >=32 RESISTANT Resistant     CEFAZOLIN <=4 SENSITIVE Sensitive     CEFEPIME <=0.12 SENSITIVE Sensitive     CEFTRIAXONE <=0.25 SENSITIVE Sensitive     CIPROFLOXACIN <=0.25 SENSITIVE Sensitive     GENTAMICIN <=1 SENSITIVE Sensitive     IMIPENEM <=0.25 SENSITIVE Sensitive     NITROFURANTOIN <=16 SENSITIVE Sensitive     TRIMETH/SULFA <=20 SENSITIVE Sensitive     AMPICILLIN/SULBACTAM 4 SENSITIVE Sensitive     PIP/TAZO <=4 SENSITIVE Sensitive     * >=100,000 COLONIES/mL ESCHERICHIA COLI  Resp Panel by RT-PCR (Flu A&B, Covid) Nasopharyngeal Swab     Status: None   Collection Time: 02/08/21 12:53 PM   Specimen: Nasopharyngeal Swab; Nasopharyngeal(NP) swabs in vial transport medium  Result Value Ref Range Status   SARS Coronavirus 2 by RT PCR NEGATIVE NEGATIVE Final    Comment: (NOTE) SARS-CoV-2 target nucleic acids are NOT DETECTED.  The SARS-CoV-2 RNA is generally detectable in upper respiratory specimens during the acute phase of infection. The lowest concentration of SARS-CoV-2 viral copies this assay can detect is 138 copies/mL. A negative result does not preclude SARS-Cov-2 infection and should not be used as the sole basis for treatment or other patient management decisions. A negative result may occur with  improper specimen collection/handling, submission of specimen other than nasopharyngeal swab, presence of viral mutation(s) within the areas targeted by this assay, and inadequate number of viral copies(<138 copies/mL). A negative result must be combined with clinical observations, patient history, and epidemiological information. The expected  result is Negative.  Fact Sheet for Patients:  EntrepreneurPulse.com.au  Fact Sheet for Healthcare Providers:  IncredibleEmployment.be  This test is no t yet approved or cleared by the [place] FDA and  has been authorized for detection and/or diagnosis of SARS-CoV-2 by FDA under an Emergency Use Authorization (EUA). This EUA will remain  in effect (meaning this test can be used) for the duration of the COVID-19 declaration under Section 564(b)(1) of the Act, 21 U.S.C.section 360bbb-3(b)(1), unless the authorization is terminated  or revoked sooner.       Influenza A by PCR NEGATIVE NEGATIVE Final   Influenza B by PCR NEGATIVE NEGATIVE  Final    Comment: (NOTE) The Xpert Xpress SARS-CoV-2/FLU/RSV plus assay is intended as an aid in the diagnosis of influenza from Nasopharyngeal swab specimens and should not be used as a sole basis for treatment. Nasal washings and aspirates are unacceptable for Xpert Xpress SARS-CoV-2/FLU/RSV testing.  Fact Sheet for Patients: EntrepreneurPulse.com.au  Fact Sheet for Healthcare Providers: IncredibleEmployment.be  This test is not yet approved or cleared by the [place] FDA and has been authorized for detection and/or diagnosis of SARS-CoV-2 by FDA under an Emergency Use Authorization (EUA). This EUA will remain in effect (meaning this test can be used) for the duration of the COVID-19 declaration under Section 564(b)(1) of the Act, 21 U.S.C. section 360bbb-3(b)(1), unless the authorization is terminated or revoked.  Performed at [facility], [place] [address], [place], [place] [address]   C Difficile Quick Screen w PCR reflex     Status: None   Collection Time: 02/09/21 11:10 PM   Specimen: STOOL  Result Value Ref Range Status   C Diff antigen NEGATIVE NEGATIVE Final   C Diff toxin NEGATIVE NEGATIVE Final   C Diff interpretation No C.  difficile detected.  Final    Comment: Performed at [facility], [place] [address] [place], [place] [address]  Gram stain     Status: None   Collection Time: 02/15/21  5:54 PM   Specimen: PATH Cytology CSF; Cerebrospinal Fluid  Result Value Ref Range Status   Specimen Description CSF  Final   Special Requests NONE  Final   Gram Stain   Final    NO ORGANISMS SEEN NO WBC SEEN RESULT CALLED TO, READ BACK BY AND VERIFIED WITH: RN [facility] AT 2011 02/15/21 Nicut A Performed at [facility], [place] [address] [place], [place] [address]    Report Status 02/15/2021 FINAL  Final          Radiology Studies: DG FLUORO GUIDE LUMBAR PUNCTURE  Result Date: 02/15/2021 CLINICAL DATA:  Tetraplegia. EXAM: DIAGNOSTIC LUMBAR PUNCTURE UNDER FLUOROSCOPIC GUIDANCE COMPARISON:  Thoracic spine MRI 02/13/2021. CT abdomen/pelvis 02/09/2021. FLUOROSCOPY TIME:  Fluoroscopy Time:  6 seconds Radiation Exposure Index (if provided by the fluoroscopic device): 4.6 mGy Number of Acquired Spot Images: None PROCEDURE: Informed consent was obtained from the patient prior to the procedure, including a discussion of potential complications including but not limited to headache, allergy, pain, bleeding, infection, spinal cord/nerve root injury. With the patient prone, the lower back was prepped with Betadine. 1% Lidocaine was used for local anesthesia. Lumbar puncture was performed at the L3-L4 level using a 20 gauge needle with return of clear CSF. 16 ml of CSF were obtained for laboratory studies. The patient tolerated the procedure well without immediate postprocedure complication. IMPRESSION: Technically successful L3-L4 fluoroscopically guided lumbar puncture. 16 mL of CSF obtained for laboratory studies. No immediate post-procedure complication. Electronically Signed   By: Kellie Simmering DO   On: 02/15/2021 18:05        Scheduled Meds:  calcium carbonate  1 tablet Oral BID WC    feeding supplement  237 mL Oral BID BM   folic acid  1 mg Oral Daily   metoprolol tartrate  12.5 mg Oral BID   multivitamin with minerals  1 tablet Oral Daily   pantoprazole  40 mg Oral BID   polyethylene glycol  17 g Oral Daily   potassium chloride  40 mEq Oral Daily   sucralfate  1 g Oral TID WC & HS  thiamine injection  100 mg Intravenous Daily   vitamin B-12  2,000 mcg Oral Daily   Continuous Infusions:  sodium chloride 100 mL/hr at 02/16/21 1320      LOS: 8 days    Time spent: 35 minutes.     Elmarie Shiley, MD [facility]   If 7PM-7AM, please contact night-coverage [email or web address]  02/16/2021, 1:34 PM

## 2021-02-16 NOTE — Plan of Care (Signed)

## 2021-02-16 NOTE — Progress Notes (Signed)
Occupational Therapy Treatment Patient Details Name: [name] [name] MRN: [id number] DOB: 17-Jun-1961 Today's Date: 02/16/2021    History of present illness 60 year old with no significant past medical history who presents complaining of nausea vomiting and vision changes and weakness.  She reports intermittent nausea and vomiting for the last 3 months.  Reports intermittent blurry vision. She reports worsening numbness and tingling in her hands and feet. She feels weak all over.  Pt found to be hypokalemic and with UTI. MRI revealed: 1. No acute infarct.  2. Ventriculomegaly, which may be ex vacuo in the setting of  moderate atrophy. Normal pressure hydrocephalus could have a similar  appearance in the correct clinical setting.  3. Mild to moderate T2/FLAIR hyperintensities within the white  matter. This finding is nonspecific but can be seen in the setting  of chronic microvascular ischemia, a demyelinating process such as multiple sclerosis, chronic migraines, or as the sequelae of a prior  infectious or inflammatory process.   OT comments  Treatment focused on functional mobility and neuromuscular re-education with use of 3 # leg weights as well as self care tasks. Worked on use of stylus  using bed remote. Patient exhibits strength (using stylus and power grip) to push button with stylus but limited by poor vision and cannot accurately hit the center of the button. Worked on LE motor control using high contrast target (black on white) with 3 # weight. Patient able to keep legs on the floor and ataxic movement decreased in seated position - but weight does not appear to improve proprioception of lower extremities with functional stand and transfer. Patietn able to assist with stand with +2 assistance but unable to control lower extremities. Legs buckle and patient becomes dead weight - with patient unable to take step or maintain knee extension. Patient total assist for toilet transfer and toileting. Of  note patient also has loss of sensation with BM. She feels the urge but unaware that she had BM. Patient total assist to don shoes and reporting burning pain in bilateral feet today. Patient exhibits increased weakness, ataxia, impaired motor control and sensation compared to evaluation.   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 Max assist to return to supine.    Transfers Overall transfer level: Needs assistance Equipment used: 2 person hand held assist Transfers: Stand Pivot Transfers;Sit to/from Stand Sit to Stand: Total assist;+2 physical assistance;Mod assist Stand pivot transfers: Total assist;+2 safety/equipment;+2 physical assistance       General transfer comment: Able to assist with standing but unable to control LEs. Legs buckle and patient becomes dead weight with pivot.    Balance Overall balance assessment: Needs assistance Sitting-balance support: Feet supported Sitting balance-Leahy Scale: Fair Sitting balance - Comments: fair - close min guard on BSC   Standing balance support: Bilateral upper extremity supported Standing balance-Leahy Scale: Zero  ADL either performed or assessed with clinical judgement   ADL Overall ADL's : Needs assistance/impaired                         Toilet Transfer: Total assistance;+2 for  physical assistance;+2 for safety/equipment;Squat-pivot Toilet Transfer Details (indicate cue type and reason): Patient unable to feel lower extremities and take functional steps. Patients feet dragging with pivot. On return to bed - with hospital gown pulled out of the way and cues to watch feet during pivot - patient still unale to control legs for functional step. Legs buckle and patient dead weight to return. Toileting- Clothing Manipulation and Hygiene: Total assistance;Sitting/lateral lean Toileting - Clothing Manipulation Details (indicate cue type and reason): Total assist for pericare after BM.             Vision Patient Visual Report: Blurring of vision Additional Comments: Patient seen by opthalmologist who diagnosed patient with mild cataracts. Exhibits low vision deficits.   Perception     Praxis      Cognition Arousal/Alertness: Awake/alert Behavior During Therapy: WFL for tasks assessed/performed Overall Cognitive Status: Within Functional Limits for tasks assessed                                 General Comments: Predominantly oriented. Appears to have some forgetfulness. Doesn't remember therapist. Doesn't remember putting on shoes and standing yesterday.        Exercises Other Exercises Other Exercises: Tapping target (high contrast) with each leg x 5 with 3 # leg weight. Other Exercises: Using stylus and power grip to attempt to push remote buttons.   Shoulder Instructions       General Comments      Pertinent Vitals/ Pain       Pain Assessment: Faces Faces Pain Scale: Hurts even more Pain Location: Bil feet Pain Descriptors / Indicators: Discomfort;Grimacing;Guarding;Pins and needles;Sharp;Burning Pain Intervention(s): Limited activity within patient's tolerance;Monitored during session  Home Living                                          Prior Functioning/Environment              Frequency  Min 2X/week         Progress Toward Goals  OT Goals(current goals can now be found in the care plan section)  Progress towards OT goals: Not progressing toward goals - comment  Acute Rehab OT Goals Patient Stated Goal: regain independence OT Goal Formulation: With patient Time For Goal Achievement: 02/24/21 Potential to Achieve Goals: Fair  Plan Discharge plan remains appropriate    Co-evaluation          OT goals addressed during session: ADL's and self-care;Strengthening/ROM;Other (comment) (motor control)      AM-PAC OT "6 Clicks" Daily Activity     Outcome Measure   Help from another person eating meals?: A Little Help from another person taking care of personal grooming?: A Little Help from another person toileting, which includes using toliet, bedpan, or urinal?: Total Help from another person bathing (including washing, rinsing, drying)?: A Lot Help from another person to put on and taking off regular upper body clothing?: A Lot Help from another person to put on and taking off regular lower body clothing?: Total 6 Click Score: 12  End of Session Equipment Utilized During Treatment: Gait belt  OT Visit Diagnosis: Unsteadiness on feet (R26.81);Other abnormalities of gait and mobility (R26.89);Other symptoms and signs involving the nervous system (R29.898);History of falling (Z91.81);Repeated falls (R29.6);Low vision, both eyes (H54.2)   Activity Tolerance Patient limited by pain;Other (comment) (neurological symptoms)   Patient Left in bed;with call bell/phone within reach;with bed alarm set;with nursing/sitter in room   Nurse Communication Mobility status        Time: 1400-1436 OT Time Calculation (min): 36 min  Charges: OT General Charges $OT Visit: 1 Visit OT Treatments $Self Care/Home Management : 8-22 mins $Neuromuscular Re-education: 8-22 mins  Aamirah Salmi, OTR/L Acute Care Rehab Services  Office [phone] Pager: [phone]    Kelli Churn 02/16/2021, 3:06 PM

## 2021-02-16 NOTE — Progress Notes (Signed)
HEMATOLOGY-ONCOLOGY PROGRESS NOTE  SUBJECTIVE: [name] [name] is resting quietly in bed today.  She has no specific complaints.  Specifically, she denies any bleeding.  Patient received 2 units of platelets yesterday prior to lumbar puncture.  REVIEW OF SYSTEMS:   Constitutional: Denies fevers, chills Eyes: Denies blurriness of vision Ears, nose, mouth, throat, and face: Denies mucositis or sore throat Respiratory: Denies cough, dyspnea or wheezes Cardiovascular: Denies palpitation, chest discomfort Gastrointestinal:  Denies nausea, heartburn or change in bowel habits Skin: Denies abnormal skin rashes Lymphatics: Denies new lymphadenopathy or easy bruising Neurological: Has lower extremity weakness Behavioral/Psych: Mood is stable, no new changes  Extremities: No lower extremity edema All other systems were reviewed with the patient and are negative.  I have reviewed the past medical history, past surgical history, social history and family history with the patient and they are unchanged from previous note.   PHYSICAL EXAMINATION:  Vitals:   02/15/21 2058 02/16/21 0458  BP: (!) 143/97 (!) 153/97  Pulse: 88 83  Resp: 15 19  Temp: 98.4 F (36.9 C) 97.8 F (36.6 C)  SpO2: 99% 100%   Filed Weights   02/08/21 1210  Weight: 49.9 kg    Intake/Output from previous day: 06/20 0701 - 06/21 0700 In: 3189.7 [P.O.:480; I.V.:1970.1; Blood:739.7] Out: 1500 [Urine:1500]  GENERAL:alert, no distress and comfortable SKIN: skin color, texture, turgor are normal, no rashes or significant lesions LUNGS: clear to auscultation and percussion with normal breathing effort HEART: regular rate & rhythm and no murmurs and no lower extremity edema ABDOMEN:abdomen soft, non-tender and normal bowel sounds NEURO: alert & oriented x 3 with fluent speech  LABORATORY DATA:  I have reviewed the data as listed CMP Latest Ref Rng & Units 02/16/2021 02/15/2021 02/14/2021  Glucose 70 - 99 mg/dL 100(H) 110(H)  110(H)  BUN 6 - 20 mg/dL <5(L) <5(L) <5(L)  Creatinine 0.44 - 1.00 mg/dL 0.40(L) 0.41(L) 0.43(L)  Sodium 135 - 145 mmol/L 138 136 135  Potassium 3.5 - 5.1 mmol/L 3.3(L) 3.6 3.3(L)  Chloride 98 - 111 mmol/L 107 105 104  CO2 22 - 32 mmol/L 25 26 26   Calcium 8.9 - 10.3 mg/dL 8.5(L) 8.5(L) 8.6(L)  Total Protein 6.5 - 8.1 g/dL - - -  Total Bilirubin 0.3 - 1.2 mg/dL - - -  Alkaline Phos 38 - 126 U/L - - -  AST 15 - 41 U/L - - -  ALT 0 - 44 U/L - - -    Lab Results  Component Value Date   WBC 5.9 02/16/2021   HGB 8.3 (L) 02/16/2021   HCT 25.1 (L) 02/16/2021   MCV 105.0 (H) 02/16/2021   PLT 61 (L) 02/16/2021   NEUTROABS 5.2 02/15/2021    CT HEAD WO CONTRAST  Result Date: 02/08/2021 CLINICAL DATA:  Acute neuro deficit.  Vision change.  Unsteady gait EXAM: CT HEAD WITHOUT CONTRAST TECHNIQUE: Contiguous axial images were obtained from the base of the skull through the vertex without intravenous contrast. COMPARISON:  None. FINDINGS: Brain: Generalized atrophy. Periventricular white matter hypodensity bilaterally appears symmetric. Negative for acute infarct, hemorrhage, mass. 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 Electronically Signed   By: Franchot Gallo M.D.   On: 02/08/2021 13:34   MR BRAIN WO CONTRAST  Result Date: 02/08/2021 CLINICAL DATA:  Transient ischemic attack. EXAM: MRI HEAD WITHOUT CONTRAST TECHNIQUE: Multiplanar, multiecho pulse sequences of the brain and surrounding structures  were obtained without intravenous contrast. COMPARISON:  Same day CT head. FINDINGS: Brain: No acute infarction, hemorrhage, hydrocephalus, extra-axial collection or mass lesion. Moderate atrophy with ventriculomegaly. Cerebellar tonsils are in normal position. Unremarkable sella. Vascular: Major arterial flow voids are maintained at the skull base. Skull  and upper cervical spine: Normal marrow signal. Sinuses/Orbits: Mild ethmoid air cell mucosal thickening. No air-fluid levels. Unremarkable orbits. Other: No sizable mastoid effusions. IMPRESSION: 1. No acute infarct. 2. Ventriculomegaly, which may be ex vacuo in the setting of moderate atrophy. Normal pressure hydrocephalus could have a similar appearance in the correct clinical setting. 3. Mild to moderate T2/FLAIR hyperintensities within the white matter. This finding is nonspecific but can be seen in the setting of chronic microvascular ischemia, a demyelinating process such as multiple sclerosis, chronic migraines, or as the sequelae of a prior infectious or inflammatory process. Electronically Signed   By: Margaretha Sheffield MD   On: 02/08/2021 17:34   MR CERVICAL SPINE W WO CONTRAST  Result Date: 02/13/2021 CLINICAL DATA:  Myelopathy EXAM: MRI CERVICAL SPINE WITHOUT AND WITH CONTRAST TECHNIQUE: Multiplanar and multiecho pulse sequences of the cervical spine, to include the craniocervical junction and cervicothoracic junction, were obtained without and with intravenous contrast. CONTRAST:  97m GADAVIST GADOBUTROL 1 MMOL/ML IV SOLN COMPARISON:  None. FINDINGS: Alignment: Grade 1 retrolisthesis at C5-6 Vertebrae: No fracture, evidence of discitis, or bone lesion. Cord: Normal signal and morphology. No abnormal contrast enhancement. Posterior Fossa, vertebral arteries, paraspinal tissues: Negative. Disc levels: C1-C2: Normal. C2-C3: Normal disc space and facets. No spinal canal or neuroforaminal stenosis. C3-C4: Moderate right facet hypertrophy. No spinal canal or neural foraminal stenosis. C4-C5: Normal disc space and facets. No spinal canal or neuroforaminal stenosis. C5-C6: Small disc bulge with bilateral uncovertebral osteophytes. Moderate right foraminal stenosis. No spinal canal stenosis. C6-C7: Small central disc protrusion without spinal canal or neural foraminal stenosis. C7-T1: Normal disc space and  facets. No spinal canal or neuroforaminal stenosis. IMPRESSION: 1. No spinal canal stenosis. 2. Moderate right C5-6 neural foraminal stenosis due to uncovertebral osteophytes. 3. Small central disc protrusion at C6-C7 without spinal canal or neural foraminal stenosis. Electronically Signed   By: KUlyses JarredM.D.   On: 02/13/2021 21:05   MR THORACIC SPINE W WO CONTRAST  Result Date: 02/13/2021 CLINICAL DATA:  Myelopathy EXAM: MRI THORACIC WITHOUT AND WITH CONTRAST TECHNIQUE: Multiplanar and multiecho pulse sequences of the thoracic spine were obtained without and with intravenous contrast. CONTRAST:  419mGADAVIST GADOBUTROL 1 MMOL/ML IV SOLN COMPARISON:  None. FINDINGS: Alignment:  Physiologic. Vertebrae: No fracture, evidence of discitis, or bone lesion. Cord: Normal signal and morphology. No abnormal contrast enhancement Paraspinal and other soft tissues: Negative Disc levels: There is a small central disc protrusion at T10-11 without spinal canal or neural foraminal stenosis. The other disc levels are normal. IMPRESSION: 1. No thoracic spinal canal or neural foraminal stenosis. 2. Small central disc protrusion at T10-11 without spinal canal or neural foraminal stenosis. Electronically Signed   By: KeUlyses Jarred.D.   On: 02/13/2021 21:11   CT ABDOMEN PELVIS W CONTRAST  Result Date: 02/09/2021 CLINICAL DATA:  Abdominal pain with nausea vomiting EXAM: CT ABDOMEN AND PELVIS WITH CONTRAST TECHNIQUE: Multidetector CT imaging of the abdomen and pelvis was performed using the standard protocol following bolus administration of intravenous contrast. CONTRAST:  7548mMNIPAQUE IOHEXOL 300 MG/ML  SOLN COMPARISON:  None. FINDINGS: Lower chest: No acute abnormality. Normal size heart. Left anterior descending coronary artery calcifications. No significant pericardial  effusion/thickening. Hepatobiliary: Hepatomegaly measuring 18.2 cm in maximum craniocaudal dimension. Diffusely decreased attenuation of the hepatic  parenchyma with geographic regions of hyperattenuation involving the gallbladder fossa anterior left lobe of the liver and posterior right lobe of the liver, which persist on delayed imaging, for instance on image 27/2 and 32/2. Gallbladder is grossly unremarkable. No biliary ductal dilation. Pancreas: Within normal limits. Spleen: Calcified splenic granulomata.  Normal size spleen. Adrenals/Urinary Tract: Adrenal glands are unremarkable. Kidneys are normal, without renal calculi, focal lesion, or hydronephrosis. Bladder is unremarkable. Stomach/Bowel: Mild symmetric thickening of the gastric antrum. Normal positioning of the duodenum/ligament of Treitz. Radiopaque enteric contrast traverses the hepatic flexure. No pathologic dilation of small bowel. Normal appendix. Gas and fluid visualized throughout the colon. Vascular/Lymphatic: Aortic atherosclerosis without aneurysmal dilation. The hepatic, portal and splenic veins appear patent. Prominent periportal lymph nodes measuring up to 5 mm on image 32/2, likely reactive. No pathologically enlarged abdominal or pelvic lymph nodes. Reproductive: Status post hysterectomy. No adnexal masses. Other: No abdominopelvic ascites. Musculoskeletal: Mild multilevel degenerative changes spine. No aggressive lytic or blastic lesion of bone. IMPRESSION: 1. Hepatomegaly with diffuse hepatic hypoattenuation and superimposed geographic regions of hyperattenuation involving the gallbladder fossa, anterior left lobe of the liver and posterior right lobe of the liver, which persist on delayed imaging. Findings which likely reflect hepatic steatosis with focal fatty sparing. 2. Mild symmetric thickening of the gastric antrum, which may represent gastritis. 3. Gas and fluid visualized throughout the colon suggestive of diarrheal state. No evidence of bowel obstruction. 4. Aortic atherosclerosis.  Aortic Atherosclerosis (ICD10-I70.0). Electronically Signed   By: Dahlia Bailiff MD   On:  02/09/2021 13:59   CT BIOPSY  Result Date: 02/11/2021 INDICATION: Anemia, thrombocytopenia EXAM: CT GUIDED RIGHT ILIAC BONE MARROW ASPIRATION AND CORE BIOPSY Date:  02/11/2021 02/11/2021 10:16 am Radiologist:  M. Daryll Brod, MD Guidance:  CT FLUOROSCOPY TIME:  Fluoroscopy Time: None. MEDICATIONS: 1% lidocaine local ANESTHESIA/SEDATION: 2.0 mg IV Versed; 100 mcg IV Fentanyl Moderate Sedation Time:  10 minutes The patient was continuously monitored during the procedure by the interventional radiology nurse under my direct supervision. CONTRAST:  None. COMPLICATIONS: None PROCEDURE: Informed consent was obtained from the patient following explanation of the procedure, risks, benefits and alternatives. The patient understands, agrees and consents for the procedure. All questions were addressed. A time out was performed. The patient was positioned prone and non-contrast localization CT was performed of the pelvis to demonstrate the iliac marrow spaces. Maximal barrier sterile technique utilized including caps, mask, sterile gowns, sterile gloves, large sterile drape, hand hygiene, and Betadine prep. Under sterile conditions and local anesthesia, an 11 gauge coaxial bone biopsy needle was advanced into the right iliac marrow space. Needle position was confirmed with CT imaging. Initially, bone marrow aspiration was performed. Next, the 11 gauge outer cannula was utilized to obtain a right iliac bone marrow core biopsy. Needle was removed. Hemostasis was obtained with compression. The patient tolerated the procedure well. Samples were prepared with the cytotechnologist. No immediate complications. IMPRESSION: CT guided right iliac bone marrow aspiration and core biopsy. Electronically Signed   By: Jerilynn Mages.  Shick M.D.   On: 02/11/2021 11:28   CT BONE MARROW BIOPSY & ASPIRATION  Result Date: 02/11/2021 INDICATION: Anemia, thrombocytopenia EXAM: CT GUIDED RIGHT ILIAC BONE MARROW ASPIRATION AND CORE BIOPSY Date:  02/11/2021  02/11/2021 10:16 am Radiologist:  M. Daryll Brod, MD Guidance:  CT FLUOROSCOPY TIME:  Fluoroscopy Time: None. MEDICATIONS: 1% lidocaine local ANESTHESIA/SEDATION: 2.0 mg IV Versed;  100 mcg IV Fentanyl Moderate Sedation Time:  10 minutes The patient was continuously monitored during the procedure by the interventional radiology nurse under my direct supervision. CONTRAST:  None. COMPLICATIONS: None PROCEDURE: Informed consent was obtained from the patient following explanation of the procedure, risks, benefits and alternatives. The patient understands, agrees and consents for the procedure. All questions were addressed. A time out was performed. The patient was positioned prone and non-contrast localization CT was performed of the pelvis to demonstrate the iliac marrow spaces. Maximal barrier sterile technique utilized including caps, mask, sterile gowns, sterile gloves, large sterile drape, hand hygiene, and Betadine prep. Under sterile conditions and local anesthesia, an 11 gauge coaxial bone biopsy needle was advanced into the right iliac marrow space. Needle position was confirmed with CT imaging. Initially, bone marrow aspiration was performed. Next, the 11 gauge outer cannula was utilized to obtain a right iliac bone marrow core biopsy. Needle was removed. Hemostasis was obtained with compression. The patient tolerated the procedure well. Samples were prepared with the cytotechnologist. No immediate complications. IMPRESSION: CT guided right iliac bone marrow aspiration and core biopsy. Electronically Signed   By: Jerilynn Mages.  Shick M.D.   On: 02/11/2021 11:28   DG FLUORO GUIDE LUMBAR PUNCTURE  Result Date: 02/15/2021 CLINICAL DATA:  Tetraplegia. EXAM: DIAGNOSTIC LUMBAR PUNCTURE UNDER FLUOROSCOPIC GUIDANCE COMPARISON:  Thoracic spine MRI 02/13/2021. CT abdomen/pelvis 02/09/2021. FLUOROSCOPY TIME:  Fluoroscopy Time:  6 seconds Radiation Exposure Index (if provided by the fluoroscopic device): 4.6 mGy Number of  Acquired Spot Images: None PROCEDURE: Informed consent was obtained from the patient prior to the procedure, including a discussion of potential complications including but not limited to headache, allergy, pain, bleeding, infection, spinal cord/nerve root injury. With the patient prone, the lower back was prepped with Betadine. 1% Lidocaine was used for local anesthesia. Lumbar puncture was performed at the L3-L4 level using a 20 gauge needle with return of clear CSF. 16 ml of CSF were obtained for laboratory studies. The patient tolerated the procedure well without immediate postprocedure complication. IMPRESSION: Technically successful L3-L4 fluoroscopically guided lumbar puncture. 16 mL of CSF obtained for laboratory studies. No immediate post-procedure complication. Electronically Signed   By: Kellie Simmering DO   On: 02/15/2021 18:05    ASSESSMENT AND PLAN: This is a very pleasant 60 year old white female admitted with significant nausea and vomiting as well as hypokalemia and was found to have persistent anemia and thrombocytopenia likely secondary to nutritional deficiency as well as history of alcohol abuse but other bone marrow abnormality could not be excluded.  The patient underwent a bone marrow biopsy on 02/11/2021.  Bone marrow biopsy results of been reviewed and discussed with the patient today.  There were no monoclonal B-cell or phenotypically aberrant T-cell populations identified.  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  Significant megaloblastic changes or erythroid hyperplasia not seen, there were also increased ring sideroblasts (less than 15%).  These changes raise the possibility of MDS with ring sideroblasts but similar morphologic changes can be seen secondary to the direct effects of alcohol.  Additional studies are pending including FISH for MDS and cytogenetics.  I discussed  with the patient that her findings are nonspecific at this time.  We will follow-up on the additional studies pending.  It may take several weeks for these results to return.  We will arrange for outpatient follow-up at the cancer center to discuss these  results.  CBC from today has been reviewed.  Her hemoglobin is overall stable.  Platelets are at 61,000 following 2 units of platelets.  Recommend continued monitoring of her CBC.  Would only transfuse platelets for platelet count is less than 10,000 or active bleeding.  Transfuse PRBCs for hemoglobin less than 7.  Recommend continuation of folic acid.  She was again reminded to abstain from alcohol.    LOS: 8 days   Mikey Bussing, DNP, AGPCNP-BC, AOCNP 02/16/21

## 2021-02-16 NOTE — Progress Notes (Signed)
Initial Nutrition Assessment  DOCUMENTATION CODES:  Not applicable  INTERVENTION:  Obtain updated weight.  Continue regular diet.  Continue Ensure Enlive BID.  Add Magic cup TID with meals, each supplement provides 290 kcal and 9 grams of protein.  Continue vitamin supplementation.  NUTRITION DIAGNOSIS:  Inadequate oral intake related to nausea, vomiting as evidenced by per patient/family report.  GOAL:  Patient will meet greater than or equal to 90% of their needs  MONITOR:  PO intake, Supplement acceptance, Labs, Weight trends, I & O's  REASON FOR ASSESSMENT:  Malnutrition Screening Tool    ASSESSMENT:  60 yo female with no significant PMH who presents with UTI and altered lab values. She complains of nausea vomiting and vision changes and weakness. She reports intermittent nausea and vomiting for the last 3 months. Reports intermittent blurry vision. She denies headache. She reports worsening numbness and tingling in her hands and feet. She feels weak all over.  RD working remotely.  Per Epic, pt has been eating variable amounts of her meals the past 6 days with limited documentation, ranging from 0-100%. Pt is drinking 2 Ensure Enlives per day from [facility].  No recent weight history in Epic or Care Everywhere. Most recent weight is from 05/2016 at 106 lbs. Pt currently weighs 110 lbs. Pt likely has always been small. Also pt due for new weight in hospital. Reweigh when possible.  Recommend continuing Ensure Enlive BID and adding Magic Cup TID to promote caloric and protein intake. Continue vitamin supplementation.  Medications: reviewed; Os-Cal BID, EE BID, folic acid, MVI with minerals, Protonix, miralax, Klor-Con 40 mEq, sucralfate TID, thiamine, Vitamin B12, NaCl @ 100 ml/hr per IV  Labs: reviewed; K 3.3 (L), Glucose 100 (H), serum Ca 8.5  NUTRITION - FOCUSED PHYSICAL EXAM: Unable to perform  Diet Order:   Diet Order             Diet regular Room service  appropriate? Yes; Fluid consistency: Thin  Diet effective now                  EDUCATION NEEDS:  No education needs have been identified at this time  Skin:  Skin Assessment: Reviewed RN Assessment  Last BM:  02/10/21 - Type 6, small; OBR in place  Height:  Ht Readings from Last 1 Encounters:  02/08/21 5\' 1"  (1.549 m)   Weight:  Wt Readings from Last 1 Encounters:  02/08/21 49.9 kg   Ideal Body Weight:  47.7 kg  BMI:  Body mass index is 20.78 kg/m.  Estimated Nutritional Needs:  Kcal:  1750-1950 Protein:  70-85 grams Fluid:  >1.75 L  02/10/21, RD, LDN Registered Dietitian I After-Hours/Weekend Pager # in [place]

## 2021-02-17 LAB — CBC
HCT: 26.7 % — ABNORMAL LOW (ref 36.0–46.0)
Hemoglobin: 8.7 g/dL — ABNORMAL LOW (ref 12.0–15.0)
MCH: 35.2 pg — ABNORMAL HIGH (ref 26.0–34.0)
MCHC: 32.6 g/dL (ref 30.0–36.0)
MCV: 108.1 fL — ABNORMAL HIGH (ref 80.0–100.0)
Platelets: 81 10*3/uL — ABNORMAL LOW (ref 150–400)
RBC: 2.47 MIL/uL — ABNORMAL LOW (ref 3.87–5.11)
RDW: 16.8 % — ABNORMAL HIGH (ref 11.5–15.5)
WBC: 7.3 10*3/uL (ref 4.0–10.5)
nRBC: 0 % (ref 0.0–0.2)

## 2021-02-17 LAB — IGG CSF INDEX
Albumin CSF-mCnc: 20 mg/dL (ref 8–37)
Albumin: 3.2 g/dL — ABNORMAL LOW (ref 3.8–4.9)
CSF IgG Index: 0.6 (ref 0.0–0.7)
IgG (Immunoglobin G), Serum: 758 mg/dL (ref 586–1602)
IgG, CSF: 2.9 mg/dL (ref 0.0–6.7)
IgG/Alb Ratio, CSF: 0.15 (ref 0.00–0.25)

## 2021-02-17 LAB — MAGNESIUM: Magnesium: 1.9 mg/dL (ref 1.7–2.4)

## 2021-02-17 LAB — BASIC METABOLIC PANEL
Anion gap: 5 (ref 5–15)
BUN: <5 mg/dL — ABNORMAL LOW (ref 6–20)
CO2: 24 mmol/L (ref 22–32)
Calcium: 8.8 mg/dL — ABNORMAL LOW (ref 8.9–10.3)
Chloride: 109 mmol/L (ref 98–111)
Creatinine, Ser: 0.48 mg/dL (ref 0.44–1.00)
GFR, Estimated: >60 mL/min (ref >60)
Glucose, Bld: 95 mg/dL (ref 70–99)
Potassium: 4 mmol/L (ref 3.5–5.1)
Sodium: 138 mmol/L (ref 135–145)

## 2021-02-17 LAB — VDRL, CSF: VDRL Quant, CSF: NONREACTIVE

## 2021-02-17 MED ORDER — THIAMINE HCL 100 MG PO TABS
100.0000 mg | ORAL_TABLET | Freq: Every day | ORAL | Status: DC
  Administered 2021-02-17 – 2021-02-27 (×11): 100 mg via ORAL
  Filled 2021-02-17 (×11): qty 1

## 2021-02-17 NOTE — Progress Notes (Signed)
PHARMACIST - PHYSICIAN COMMUNICATION  DR:   Jacqulyn Bath  CONCERNING: IV to Oral Route Change Policy  RECOMMENDATION: This patient is receiving thiamine by the intravenous route.  Based on criteria approved by the Pharmacy and Therapeutics Committee, the intravenous medication(s) is/are being converted to the equivalent oral dose form(s).   DESCRIPTION: These criteria include: The patient is eating (either orally or via tube) and/or has been taking other orally administered medications for a least 24 hours (including folic acid and B12) The patient has no evidence of active gastrointestinal bleeding or impaired GI absorption (gastrectomy, short bowel, patient on TNA or NPO).  If you have questions about this conversion, please contact the Pharmacy Department   Rollene Fare [facility] 02/17/2021 8:50 AM Pharmacy: [phone]

## 2021-02-17 NOTE — Progress Notes (Signed)
Physical Therapy Treatment Patient Details Name: [name] [name] MRN: [id number] DOB: 05-12-1961 Today's Date: 02/17/2021    History of Present Illness 60 year old with no significant past medical history who presents complaining of nausea vomiting and vision changes and weakness.  She reports intermittent nausea and vomiting for the last 3 months.  Reports intermittent blurry vision. She reports worsening numbness and tingling in her hands and feet. She feels weak all over.  Pt found to be hypokalemic and with UTI. MRI revealed: 1. No acute infarct.  2. Ventriculomegaly, which may be ex vacuo in the setting of  moderate atrophy. Normal pressure hydrocephalus could have a similar  appearance in the correct clinical setting.  3. Mild to moderate T2/FLAIR hyperintensities within the white  matter. This finding is nonspecific but can be seen in the setting  of chronic microvascular ischemia, a demyelinating process such as multiple sclerosis, chronic migraines, or as the sequelae of a prior  infectious or inflammatory process.    PT Comments    Pt AxO x 3 still c/o poor vision and currently unable to amb.  General transfer comment: Total Assist + 2 arm in arm from bed to [facility] then to recliner only as pt was unable to support self and unable to control hyper mobility/spastic mvts.  B knees buckling as well. 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  Poor self Gross motor control.    Transfers Overall transfer level: Needs assistance Equipment used: 2 person hand held assist Transfers: Stand Pivot Transfers Sit to Stand: Total assist;+2 physical assistance;Mod assist         General transfer comment: Total Assist + 2 arm in arm from bed to [facility] then to recliner only as pt was unable to support self and unable to control hyper mobility/spastic mvts.  B knees buckling as well.  Ambulation/Gait             General Gait Details: transfers only   Stairs             Wheelchair Mobility    Modified Rankin (Stroke Patients Only)       Balance                                            Cognition Arousal/Alertness: Awake/alert Behavior During Therapy: WFL for tasks assessed/performed Overall Cognitive Status: Within Functional Limits for tasks assessed                                 General Comments: Predominantly oriented. Appears to have some forgetfulness. Doesn't remember therapist. Doesn't remember putting on shoes and standing yesterday.      Exercises      General Comments  Pertinent Vitals/Pain Pain Assessment: Faces Faces Pain Scale: Hurts little more Pain Location: Bil feet tingling Pain Descriptors / Indicators: Discomfort;Grimacing;Guarding;Pins and needles;Sharp;Burning Pain Intervention(s): Monitored during session;Repositioned    Home Living                      Prior Function            PT Goals (current goals can now be found in the care plan section) Progress towards PT goals: Progressing toward goals    Frequency    Min 3X/week      PT Plan Current plan remains appropriate    Co-evaluation              AM-PAC PT "6 Clicks" Mobility   Outcome Measure  Help needed turning from your back to your side while in a flat bed without using bedrails?: Total Help needed moving from lying on your back  to sitting on the side of a flat bed without using bedrails?: Total Help needed moving to and from a bed to a chair (including a wheelchair)?: Total Help needed standing up from a chair using your arms (e.g., wheelchair or bedside chair)?: Total Help needed to walk in hospital room?: Total Help needed climbing 3-5 steps with a railing? : Total 6 Click Score: 6    End of Session Equipment Utilized During Treatment: Gait belt Activity Tolerance: Treatment limited secondary to medical complications (Comment) Patient left: with bed alarm set;with call bell/phone within reach;in chair Nurse Communication: Mobility status PT Visit Diagnosis: Unsteadiness on feet (R26.81);Other abnormalities of gait and mobility (R26.89);Difficulty in walking, not elsewhere classified (R26.2)     Time:  - 11:10 - 11:40    Charges:    2 ta                Felecia Shelling  PTA Acute  Rehabilitation Services Pager      [phone] Office      [phone]

## 2021-02-17 NOTE — Progress Notes (Signed)
PROGRESS NOTE    [name] [name]  XAJ:[id number] DOB: [birth year] DOA: 02/08/2021 PCP: Pcp, No   Brief Narrative: 60 year old with no significant past medical history who presents complaining of nausea vomiting and vision changes and weakness.  She reports intermittent nausea and vomiting for the last 3 months.  Reports intermittent blurry vision.  She denies headache.  She reports worsening numbness and tingling in her hands and feet.  She feels weak all over.  Evaluation in the ED: Patient was found to have a potassium of 2.4.  UA consistent with UTI.  Patient was admitted with severe hypokalemia and UTI.  She was treated with antibiotics.  Electrolytes were repleted.  She was also found to have anemia and thrombocytopenia, for which she underwent bone marrow biopsy with nonspecific findings.  Additional studies are pending including FISH for MDS and cytogenetics.  She will need to follow-up with oncology as an outpatient.  Initially her overall weakness improved, subsequently she reported worsening of lower extremity weakness.  She underwent MRI which was negative for stroke, ventriculomegaly and chronic microvascular ischemia.  Neurology was consulted.  She is underwent MRI of cervical and thoracic spine unrevealing.  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  Patient underwent lumbar puncture on 02/16/2021.  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   Assessment & Plan:   Active Problems:   Hypokalemia   Lower urinary tract infectious disease   Thrombocytopenia (HCC)   Dietary folate deficiency anemia  1-Extremities weakness:  Lower extremities weakness worse than upper extremities weakness but it is equal and bilateral.   -Numbness and tingling stable.  -Started C94 and folic acid.  -MRI negative for stroke.  -MRI cervical and thoracic unrevealing.  -Underwent LP  6/21. 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  Labs Pending; Anti- GM1, MMA, Homocysteine 26, Thiamine level, copper, vitamin E; B 6 CSF; IgG index, Oligoclonal bands, VDRL Pending.  Patient has been started on IVIG by neurology on 02/16/2021 for total of 5 days.  Hypokalemia, Hyponatremia, Hypocalcemia: All are within normal range.  Continue to replace daily and check daily.  2-Anemia; Thrombocytopenia;  Seen by oncology.  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  Patient received 2 units of platelets transfusion before LP on 02/16/2021.  3-Epigastric pain;  CT abdomen ; possible gastritis, lipase negative Might need GI follow up. Endoscopy/. Patient decline inpatient evaluation.  C diff negative.  Continue PPI and Carafate.  Abdominal pain resolved.  5-UTI; urine culture grew E. coli.  Completed 5 days ceftriaxone.   6-Mild transaminases; CT hepatic steatosis. Hepatomegaly.   7-HTN; Started on metoprolol.  Blood pressure controlled.  8-Blurry vision, decrease vision right Eye;  Ophthalmology consulted. Patient diagnose with dry eye, glaucoma. Follow up out patient.   9-sinus tachycardia: On beta-blocker.  Controlled.  Estimated body mass index is 17.37 kg/m as calculated from the following:   Height as of this encounter: 5' 1"  (1.549 m).   Weight as of this encounter: 41.7 kg.   DVT prophylaxis: SCD Code Status: Partial Code; no intubation  Family Communication: Care discussed with patient.  Disposition Plan:  Status is: Inpatient  Remains inpatient  appropriate because:IV treatments appropriate due to intensity of illness or inability to take  PO  Dispo: The patient is from: home              Anticipated d/c is to: Home              Patient currently is not medically stable to d/c.   Difficult to place patient No        Consultants:  Neurology  Procedures:  LP  Antimicrobials:    Subjective: Patient seen and examined.  Still complains of tingling in the feet and hands.  No other complaint  Objective: Vitals:   02/17/21 0028 02/17/21 0400 02/17/21 0500 02/17/21 1000  BP: (!) 129/96 (!) 154/98  (!) 136/97  Pulse: 81 85  91  Resp: 16     Temp: (!) 96.7 F (35.9 C) (!) 96.5 F (35.8 C)  98 F (36.7 C)  TempSrc: Oral Oral  Oral  SpO2: 99% 97%  97%  Weight:   41.7 kg   Height:        Intake/Output Summary (Last 24 hours) at 02/17/2021 1107 Last data filed at 02/17/2021 0500 Gross per 24 hour  Intake 1066.11 ml  Output 1300 ml  Net -233.89 ml    Filed Weights   02/08/21 1210 02/17/21 0500  Weight: 49.9 kg 41.7 kg    Examination: General exam: Appears calm and comfortable  Respiratory system: Clear to auscultation. Respiratory effort normal. Cardiovascular system: S1 & S2 heard, RRR. No JVD, murmurs, rubs, gallops or clicks. No pedal edema. Gastrointestinal system: Abdomen is nondistended, soft and nontender. No organomegaly or masses felt. Normal bowel sounds heard. Central nervous system: Alert and oriented. No focal neurological deficits however she has generalized weakness in all 4 extremities which is equal Extremities: Symmetric 5 x 5 power. Skin: No rashes, lesions or ulcers.  Psychiatry: Judgement and insight appear normal. Mood & affect appropriate.   Data Reviewed: I have personally reviewed following labs and imaging studies  CBC: Recent Labs  Lab 02/14/21 0852 02/15/21 0433 02/15/21 2042 02/16/21 0424 02/17/21 0339  WBC 5.5 6.4 7.7 5.9 7.3  NEUTROABS  --   --  5.2  --   --   HGB 8.5* 8.0* 8.4* 8.3* 8.7*  HCT 24.8* 23.2* 24.5* 25.1* 26.7*  MCV 103.8* 104.0* 104.3* 105.0* 108.1*   PLT 32* 35* 60* 61* 81*    Basic Metabolic Panel: Recent Labs  Lab 02/13/21 0437 02/14/21 0852 02/15/21 0906 02/16/21 0424 02/17/21 0339  NA 133* 135 136 138 138  K 3.0* 3.3* 3.6 3.3* 4.0  CL 101 104 105 107 109  CO2 24 26 26 25 24   GLUCOSE 109* 110* 110* 100* 95  BUN <5* <5* <5* <5* <5*  CREATININE 0.52 0.43* 0.41* 0.40* 0.48  CALCIUM 8.4* 8.6* 8.5* 8.5* 8.8*  MG 1.6*  --  2.0 1.8 1.9    GFR: Estimated Creatinine Clearance: 49.8 mL/min (by C-G formula based on SCr of 0.48 mg/dL). Liver Function Tests: No results for input(s): AST, ALT, ALKPHOS, BILITOT, PROT, ALBUMIN in the last 168 hours.  No results for input(s): LIPASE, AMYLASE in the last 168 hours.  No results for input(s): AMMONIA in the last 168 hours. Coagulation Profile: No results for input(s): INR, PROTIME in the last 168 hours. Cardiac Enzymes: No results for input(s): CKTOTAL, CKMB, CKMBINDEX, TROPONINI in the last 168 hours. BNP (last 3 results) No results for input(s): PROBNP in the last 8760 hours. HbA1C: No results for  input(s): HGBA1C in the last 72 hours. CBG: No results for input(s): GLUCAP in the last 168 hours. Lipid Profile: No results for input(s): CHOL, HDL, LDLCALC, TRIG, CHOLHDL, LDLDIRECT in the last 72 hours. Thyroid Function Tests: No results for input(s): TSH, T4TOTAL, FREET4, T3FREE, THYROIDAB in the last 72 hours.  Anemia Panel: No results for input(s): VITAMINB12, FOLATE, FERRITIN, TIBC, IRON, RETICCTPCT in the last 72 hours.  Sepsis Labs: No results for input(s): PROCALCITON, LATICACIDVEN in the last 168 hours.  Recent Results (from the past 240 hour(s))  Urine culture     Status: Abnormal   Collection Time: 02/08/21 12:41 PM   Specimen: Urine, Random  Result Value Ref Range Status   Specimen Description   Final    URINE, RANDOM Performed at [facility] [address], [place], [place] [address]    Special Requests   Final    NONE Performed at  [facility], [place] [address], [place], [place] [address]    Culture >=100,000 COLONIES/mL ESCHERICHIA COLI (A)  Final   Report Status 02/10/2021 FINAL  Final   Organism ID, Bacteria ESCHERICHIA COLI (A)  Final      Susceptibility   Escherichia coli - MIC*    AMPICILLIN >=32 RESISTANT Resistant     CEFAZOLIN <=4 SENSITIVE Sensitive     CEFEPIME <=0.12 SENSITIVE Sensitive     CEFTRIAXONE <=0.25 SENSITIVE Sensitive     CIPROFLOXACIN <=0.25 SENSITIVE Sensitive     GENTAMICIN <=1 SENSITIVE Sensitive     IMIPENEM <=0.25 SENSITIVE Sensitive     NITROFURANTOIN <=16 SENSITIVE Sensitive     TRIMETH/SULFA <=20 SENSITIVE Sensitive     AMPICILLIN/SULBACTAM 4 SENSITIVE Sensitive     PIP/TAZO <=4 SENSITIVE Sensitive     * >=100,000 COLONIES/mL ESCHERICHIA COLI  Resp Panel by RT-PCR (Flu A&B, Covid) Nasopharyngeal Swab     Status: None   Collection Time: 02/08/21 12:53 PM   Specimen: Nasopharyngeal Swab; Nasopharyngeal(NP) swabs in vial transport medium  Result Value Ref Range Status   SARS Coronavirus 2 by RT PCR NEGATIVE NEGATIVE Final    Comment: (NOTE) SARS-CoV-2 target nucleic acids are NOT DETECTED.  The SARS-CoV-2 RNA is generally detectable in upper respiratory specimens during the acute phase of infection. The lowest concentration of SARS-CoV-2 viral copies this assay can detect is 138 copies/mL. A negative result does not preclude SARS-Cov-2 infection and should not be used as the sole basis for treatment or other patient management decisions. A negative result may occur with  improper specimen collection/handling, submission of specimen other than nasopharyngeal swab, presence of viral mutation(s) within the areas targeted by this assay, and inadequate number of viral copies(<138 copies/mL). A negative result must be combined with clinical observations, patient history, and epidemiological information. The expected result is Negative.  Fact Sheet for Patients:   EntrepreneurPulse.com.au  Fact Sheet for Healthcare Providers:  IncredibleEmployment.be  This test is no t yet approved or cleared by the [place] FDA and  has been authorized for detection and/or diagnosis of SARS-CoV-2 by FDA under an Emergency Use Authorization (EUA). This EUA will remain  in effect (meaning this test can be used) for the duration of the COVID-19 declaration under Section 564(b)(1) of the Act, 21 U.S.C.section 360bbb-3(b)(1), unless the authorization is terminated  or revoked sooner.       Influenza A by PCR NEGATIVE NEGATIVE Final   Influenza B by PCR NEGATIVE NEGATIVE Final    Comment: (NOTE) The Xpert Xpress SARS-CoV-2/FLU/RSV plus assay  is intended as an aid in the diagnosis of influenza from Nasopharyngeal swab specimens and should not be used as a sole basis for treatment. Nasal washings and aspirates are unacceptable for Xpert Xpress SARS-CoV-2/FLU/RSV testing.  Fact Sheet for Patients: EntrepreneurPulse.com.au  Fact Sheet for Healthcare Providers: IncredibleEmployment.be  This test is not yet approved or cleared by the [place] FDA and has been authorized for detection and/or diagnosis of SARS-CoV-2 by FDA under an Emergency Use Authorization (EUA). This EUA will remain in effect (meaning this test can be used) for the duration of the COVID-19 declaration under Section 564(b)(1) of the Act, 21 U.S.C. section 360bbb-3(b)(1), unless the authorization is terminated or revoked.  Performed at [facility], [place] [address] [place], [place] [address]   C Difficile Quick Screen w PCR reflex     Status: None   Collection Time: 02/09/21 11:10 PM   Specimen: STOOL  Result Value Ref Range Status   C Diff antigen NEGATIVE NEGATIVE Final   C Diff toxin NEGATIVE NEGATIVE Final   C Diff interpretation No C. difficile detected.  Final    Comment: Performed at  [facility], [place] [address], [place], [place] [address]  Gram stain     Status: None   Collection Time: 02/15/21  5:54 PM   Specimen: PATH Cytology CSF; Cerebrospinal Fluid  Result Value Ref Range Status   Specimen Description CSF  Final   Special Requests NONE  Final   Gram Stain   Final    NO ORGANISMS SEEN NO WBC SEEN RESULT CALLED TO, READ BACK BY AND VERIFIED WITH: RN [facility] AT 2011 02/15/21 [place] A Performed at [facility], [place] [address], [place], [place] [address]    Report Status 02/15/2021 FINAL  Final          Radiology Studies: DG FLUORO GUIDE LUMBAR PUNCTURE  Result Date: 02/15/2021 CLINICAL DATA:  Tetraplegia. EXAM: DIAGNOSTIC LUMBAR PUNCTURE UNDER FLUOROSCOPIC GUIDANCE COMPARISON:  Thoracic spine MRI 02/13/2021. CT abdomen/pelvis 02/09/2021. FLUOROSCOPY TIME:  Fluoroscopy Time:  6 seconds Radiation Exposure Index (if provided by the fluoroscopic device): 4.6 mGy Number of Acquired Spot Images: None PROCEDURE: Informed consent was obtained from the patient prior to the procedure, including a discussion of potential complications including but not limited to headache, allergy, pain, bleeding, infection, spinal cord/nerve root injury. With the patient prone, the lower back was prepped with Betadine. 1% Lidocaine was used for local anesthesia. Lumbar puncture was performed at the L3-L4 level using a 20 gauge needle with return of clear CSF. 16 ml of CSF were obtained for laboratory studies. The patient tolerated the procedure well without immediate postprocedure complication. IMPRESSION: Technically successful L3-L4 fluoroscopically guided lumbar puncture. 16 mL of CSF obtained for laboratory studies. No immediate post-procedure complication. Electronically Signed   By: Kellie Simmering DO   On: 02/15/2021 18:05        Scheduled Meds:  calcium carbonate  1 tablet Oral BID WC   feeding supplement  237 mL Oral BID BM   folic acid  1  mg Oral Daily   magnesium oxide  200 mg Oral BID   metoprolol tartrate  12.5 mg Oral BID   multivitamin with minerals  1 tablet Oral Daily   pantoprazole  40 mg Oral BID   polyethylene glycol  17 g Oral Daily   potassium chloride  40 mEq Oral Daily   sucralfate  1 g Oral TID WC & HS   thiamine  100 mg Oral Daily   vitamin B-12  2,000 mcg Oral Daily   Continuous Infusions:  sodium chloride 100 mL/hr at 02/17/21 0702   Immune Globulin 10% 20 g (02/16/21 1711)      LOS: 9 days    Time spent: 36 minutes.   Darliss Cheney, MD [facility]   If 7PM-7AM, please contact night-coverage [email or web address]  02/17/2021, 11:07 AM

## 2021-02-18 ENCOUNTER — Ambulatory Visit: Payer: BC Managed Care – PPO | Admitting: Internal Medicine

## 2021-02-18 ENCOUNTER — Encounter (HOSPITAL_COMMUNITY): Payer: Self-pay | Admitting: Internal Medicine

## 2021-02-18 ENCOUNTER — Other Ambulatory Visit: Payer: BC Managed Care – PPO

## 2021-02-18 LAB — CBC WITH DIFFERENTIAL/PLATELET
Abs Immature Granulocytes: 0.02 10*3/uL (ref 0.00–0.07)
Basophils Absolute: 0 10*3/uL (ref 0.0–0.1)
Basophils Relative: 1 %
Eosinophils Absolute: 0.2 10*3/uL (ref 0.0–0.5)
Eosinophils Relative: 3 %
HCT: 26.3 % — ABNORMAL LOW (ref 36.0–46.0)
Hemoglobin: 8.5 g/dL — ABNORMAL LOW (ref 12.0–15.0)
Immature Granulocytes: 0 %
Lymphocytes Relative: 24 %
Lymphs Abs: 1.7 10*3/uL (ref 0.7–4.0)
MCH: 35.4 pg — ABNORMAL HIGH (ref 26.0–34.0)
MCHC: 32.3 g/dL (ref 30.0–36.0)
MCV: 109.6 fL — ABNORMAL HIGH (ref 80.0–100.0)
Monocytes Absolute: 0.6 10*3/uL (ref 0.1–1.0)
Monocytes Relative: 9 %
Neutro Abs: 4.3 10*3/uL (ref 1.7–7.7)
Neutrophils Relative %: 63 %
Platelets: 71 10*3/uL — ABNORMAL LOW (ref 150–400)
RBC: 2.4 MIL/uL — ABNORMAL LOW (ref 3.87–5.11)
RDW: 16.8 % — ABNORMAL HIGH (ref 11.5–15.5)
WBC: 6.8 10*3/uL (ref 4.0–10.5)
nRBC: 0 % (ref 0.0–0.2)

## 2021-02-18 LAB — COMPREHENSIVE METABOLIC PANEL
ALT: 22 U/L (ref 0–44)
AST: 46 U/L — ABNORMAL HIGH (ref 15–41)
Albumin: 3 g/dL — ABNORMAL LOW (ref 3.5–5.0)
Alkaline Phosphatase: 70 U/L (ref 38–126)
Anion gap: 4 — ABNORMAL LOW (ref 5–15)
BUN: 7 mg/dL (ref 6–20)
CO2: 25 mmol/L (ref 22–32)
Calcium: 8.8 mg/dL — ABNORMAL LOW (ref 8.9–10.3)
Chloride: 107 mmol/L (ref 98–111)
Creatinine, Ser: 0.51 mg/dL (ref 0.44–1.00)
GFR, Estimated: >60 mL/min (ref >60)
Glucose, Bld: 106 mg/dL — ABNORMAL HIGH (ref 70–99)
Potassium: 4.4 mmol/L (ref 3.5–5.1)
Sodium: 136 mmol/L (ref 135–145)
Total Bilirubin: 0.7 mg/dL (ref 0.3–1.2)
Total Protein: 7.1 g/dL (ref 6.5–8.1)

## 2021-02-18 LAB — OLIGOCLONAL BANDS, CSF + SERM

## 2021-02-18 LAB — MAGNESIUM: Magnesium: 2 mg/dL (ref 1.7–2.4)

## 2021-02-18 MED ORDER — METOPROLOL TARTRATE 25 MG PO TABS
25.0000 mg | ORAL_TABLET | Freq: Two times a day (BID) | ORAL | Status: DC
  Administered 2021-02-18 – 2021-02-19 (×2): 25 mg via ORAL
  Filled 2021-02-18 (×2): qty 1

## 2021-02-18 NOTE — Progress Notes (Signed)
PROGRESS NOTE    [name] [name]  KAJ:[id number] DOB: 1960-09-27 DOA: 02/08/2021 PCP: Pcp, No   Brief Narrative: 60 year old with no significant past medical history who presents complaining of nausea vomiting and vision changes and weakness.  She reports intermittent nausea and vomiting for the last 3 months.  Reports intermittent blurry vision.  She denies headache.  She reports worsening numbness and tingling in her hands and feet.  She feels weak all over.  Evaluation in the ED: Patient was found to have a potassium of 2.4.  UA consistent with UTI.  Patient was admitted with severe hypokalemia and UTI.  She was treated with antibiotics.  Electrolytes were repleted.  She was also found to have anemia and thrombocytopenia, for which she underwent bone marrow biopsy with nonspecific findings.  Additional studies are pending including FISH for MDS and cytogenetics.  She will need to follow-up with oncology as an outpatient.  Initially her overall weakness improved, subsequently she reported worsening of lower extremity weakness.  She underwent MRI which was negative for stroke, ventriculomegaly and chronic microvascular ischemia.  Neurology was consulted.  She is underwent MRI of cervical and thoracic spine unrevealing.  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  Patient underwent lumbar puncture on 02/16/2021.  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   Assessment & Plan:   Active Problems:   Hypokalemia   Lower urinary tract infectious disease   Thrombocytopenia (HCC)   Dietary folate deficiency anemia  1-Extremities weakness:  Lower extremities weakness worse than upper extremities weakness but it is equal and bilateral.   -Numbness and tingling stable.  -Started M35 and folic acid.  -MRI negative for stroke.  -MRI cervical and thoracic unrevealing.  -Underwent LP  6/21. 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  Labs Pending; Anti- GM1, MMA, Homocysteine 26, Thiamine level, copper, vitamin E; B 6 CSF; IgG index, Oligoclonal bands, VDRL Pending.  Patient has been started on IVIG by neurology on 02/16/2021 for total of 5 days with last dose scheduled on 02/20/2021 and per neurology, she can be discharged after that.  Hypokalemia, Hyponatremia, Hypocalcemia: All are within normal range.  Continue to replace daily and check daily.  2-Anemia; Thrombocytopenia;  Seen by oncology.  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  Patient received 2 units of platelets transfusion before LP on 02/16/2021.  3-Epigastric pain;  CT abdomen ; possible gastritis, lipase negative Might need GI follow up. Endoscopy/. Patient decline inpatient evaluation.  C diff negative.  Continue PPI and Carafate.  Abdominal pain resolved.  5-UTI; urine culture grew E. coli.  Completed 5 days ceftriaxone.   6-Mild transaminases; CT hepatic steatosis. Hepatomegaly.   7-HTN; Started on metoprolol.  Blood pressure controlled.  8-Blurry vision, decrease vision right Eye;  Ophthalmology consulted. Patient diagnose with dry eye, glaucoma. Follow up out patient.   9-sinus tachycardia: On beta-blocker.  Controlled.  Estimated body mass index is 17.37 kg/m as calculated from the following:   Height as of this encounter: _0  (1.549 m).   Weight as of this encounter: 41.7 kg.   DVT prophylaxis: SCD Code Status: Partial Code; no intubation  Family  Communication: Care discussed with patient.  Disposition Plan:  Status is: Inpatient  Remains inpatient appropriate  because:IV treatments appropriate due to intensity of illness or inability to take PO  Dispo: The patient is from: home              Anticipated d/c is to: Home              Patient currently is not medically stable to d/c.   Difficult to place patient No        Consultants:  Neurology  Procedures:  LP  Antimicrobials:    Subjective: Patient seen and examined, sitting in the chair, she has no new complaint today.  Did not even complain of tingling today.  Objective: Vitals:   02/18/21 0453 02/18/21 1000 02/18/21 1050 02/18/21 1124  BP: 137/90  (!) 136/99 (!) 135/101  Pulse: 79  96 98  Resp: _0 (!) 22  Temp: 97.9 F (36.6 C)  98.3 F (36.8 C) 98.6 F (37 C)  TempSrc:   Oral Oral  SpO2: 98%  97% 99%  Weight:      Height:        Intake/Output Summary (Last 24 hours) at 02/18/2021 1309 Last data filed at 02/18/2021 0900 Gross per 24 hour  Intake 360 ml  Output 900 ml  Net -540 ml    Filed Weights   02/08/21 1210 02/17/21 0500  Weight: 49.9 kg 41.7 kg    Examination: General exam: Appears calm and comfortable  Respiratory system: Clear to auscultation. Respiratory effort normal. Cardiovascular system: S1 & S2 heard, RRR. No JVD, murmurs, rubs, gallops or clicks. No pedal edema. Gastrointestinal system: Abdomen is nondistended, soft and nontender. No organomegaly or masses felt. Normal bowel sounds heard. Central nervous system: Alert and oriented. No focal neurological deficits.  Denies weakness in all 4 extremities and equal. Extremities: Symmetric 5 x 5 power. Skin: No rashes, lesions or ulcers.  Psychiatry: Judgement and insight appear normal. Mood & affect appropriate.   Data Reviewed: I have personally reviewed following labs and imaging studies  CBC: Recent Labs  Lab 02/15/21 0433 02/15/21 2042 02/16/21 0424 02/17/21 0339 02/18/21 0404  WBC 6.4 7.7 5.9 7.3 6.8  NEUTROABS  --  5.2  --   --  4.3  HGB 8.0* 8.4* 8.3* 8.7* 8.5*  HCT 23.2*  24.5* 25.1* 26.7* 26.3*  MCV 104.0* 104.3* 105.0* 108.1* 109.6*  PLT 35* 60* 61* 81* 71*    Basic Metabolic Panel: Recent Labs  Lab 02/13/21 0437 02/14/21 0852 02/15/21 0906 02/16/21 0424 02/17/21 0339 02/18/21 0404  NA 133* 135 136 138 138 136  K 3.0* 3.3* 3.6 3.3* 4.0 4.4  CL 101 104 105 107 109 107  CO2 _1 GLUCOSE 109* 110* 110* 100* 95 106*  BUN <5* <5* <5* <5* <5* 7  CREATININE 0.52 0.43* 0.41* 0.40* 0.48 0.51  CALCIUM 8.4* 8.6* 8.5* 8.5* 8.8* 8.8*  MG 1.6*  --  2.0 1.8 1.9 2.0    GFR: Estimated Creatinine Clearance: 49.8 mL/min (by C-G formula based on SCr of 0.51 mg/dL). Liver Function Tests: Recent Labs  Lab 02/16/21 0740 02/18/21 0404  AST  --  46*  ALT  --  22  ALKPHOS  --  70  BILITOT  --  0.7  PROT  --  7.1  ALBUMIN 3.2* 3.0*    No results for input(s): LIPASE, AMYLASE in the last 168 hours.  No results for input(s): AMMONIA  in the last 168 hours. Coagulation Profile: No results for input(s): INR, PROTIME in the last 168 hours. Cardiac Enzymes: No results for input(s): CKTOTAL, CKMB, CKMBINDEX, TROPONINI in the last 168 hours. BNP (last 3 results) No results for input(s): PROBNP in the last 8760 hours. HbA1C: No results for input(s): HGBA1C in the last 72 hours. CBG: No results for input(s): GLUCAP in the last 168 hours. Lipid Profile: No results for input(s): CHOL, HDL, LDLCALC, TRIG, CHOLHDL, LDLDIRECT in the last 72 hours. Thyroid Function Tests: No results for input(s): TSH, T4TOTAL, FREET4, T3FREE, THYROIDAB in the last 72 hours.  Anemia Panel: No results for input(s): VITAMINB12, FOLATE, FERRITIN, TIBC, IRON, RETICCTPCT in the last 72 hours.  Sepsis Labs: No results for input(s): PROCALCITON, LATICACIDVEN in the last 168 hours.  Recent Results (from the past 240 hour(s))  C Difficile Quick Screen w PCR reflex     Status: None   Collection Time: 02/09/21 11:10 PM   Specimen: STOOL  Result Value Ref Range Status   C  Diff antigen NEGATIVE NEGATIVE Final   C Diff toxin NEGATIVE NEGATIVE Final   C Diff interpretation No C. difficile detected.  Final    Comment: Performed at [facility], [place] [address], [place], [place] [address]  Gram stain     Status: None   Collection Time: 02/15/21  5:54 PM   Specimen: PATH Cytology CSF; Cerebrospinal Fluid  Result Value Ref Range Status   Specimen Description CSF  Final   Special Requests NONE  Final   Gram Stain   Final    NO ORGANISMS SEEN NO WBC SEEN RESULT CALLED TO, READ BACK BY AND VERIFIED WITH: RN [facility] AT 2011 02/15/21 [place] [facility] Performed at [facility], [place] [address] [place], [place] [address]    Report Status 02/15/2021 FINAL  Final          Radiology Studies: No results found.      Scheduled Meds:  calcium carbonate  1 tablet Oral BID WC   feeding supplement  237 mL Oral BID BM   folic acid  1 mg Oral Daily   magnesium oxide  200 mg Oral BID   metoprolol tartrate  12.5 mg Oral BID   multivitamin with minerals  1 tablet Oral Daily   pantoprazole  40 mg Oral BID   polyethylene glycol  17 g Oral Daily   potassium chloride  40 mEq Oral Daily   sucralfate  1 g Oral TID WC & HS   thiamine  100 mg Oral Daily   vitamin B-12  2,000 mcg Oral Daily   Continuous Infusions:  sodium chloride 100 mL/hr at 02/17/21 0702   Immune Globulin 10% 20 g (02/18/21 1105)      LOS: 10 days    Time spent: 30 minutes.   Darliss Cheney, MD [facility]   If 7PM-7AM, please contact night-coverage [email or web address]  02/18/2021, 1:09 PM

## 2021-02-18 NOTE — Plan of Care (Signed)
  Problem: Health Behavior/Discharge Planning: Goal: Ability to manage health-related needs will improve Outcome: Progressing   

## 2021-02-18 NOTE — Progress Notes (Signed)
End of Shift: Patient on bedpan at end of shift. Call bell in reach. Bedside report given to [facility], Charity fundraiser.

## 2021-02-19 ENCOUNTER — Encounter (HOSPITAL_COMMUNITY): Payer: Self-pay | Admitting: Internal Medicine

## 2021-02-19 LAB — LACTATE DEHYDROGENASE: LDH: 153 U/L (ref 98–192)

## 2021-02-19 LAB — CBC WITH DIFFERENTIAL/PLATELET
Abs Immature Granulocytes: 0.03 10*3/uL (ref 0.00–0.07)
Basophils Absolute: 0.1 10*3/uL (ref 0.0–0.1)
Basophils Relative: 1 %
Eosinophils Absolute: 0.2 10*3/uL (ref 0.0–0.5)
Eosinophils Relative: 2 %
HCT: 28.1 % — ABNORMAL LOW (ref 36.0–46.0)
Hemoglobin: 9.3 g/dL — ABNORMAL LOW (ref 12.0–15.0)
Immature Granulocytes: 0 %
Lymphocytes Relative: 14 %
Lymphs Abs: 1.1 10*3/uL (ref 0.7–4.0)
MCH: 35.2 pg — ABNORMAL HIGH (ref 26.0–34.0)
MCHC: 33.1 g/dL (ref 30.0–36.0)
MCV: 106.4 fL — ABNORMAL HIGH (ref 80.0–100.0)
Monocytes Absolute: 0.7 10*3/uL (ref 0.1–1.0)
Monocytes Relative: 9 %
Neutro Abs: 5.5 10*3/uL (ref 1.7–7.7)
Neutrophils Relative %: 74 %
Platelets: 209 10*3/uL (ref 150–400)
RBC: 2.64 MIL/uL — ABNORMAL LOW (ref 3.87–5.11)
RDW: 16.8 % — ABNORMAL HIGH (ref 11.5–15.5)
WBC: 7.5 10*3/uL (ref 4.0–10.5)
nRBC: 0 % (ref 0.0–0.2)

## 2021-02-19 LAB — MAGNESIUM: Magnesium: 1.9 mg/dL (ref 1.7–2.4)

## 2021-02-19 LAB — DIRECT ANTIGLOBULIN TEST (NOT AT ARMC)
DAT, IgG: POSITIVE
DAT, complement: NEGATIVE

## 2021-02-19 LAB — COMPREHENSIVE METABOLIC PANEL WITH GFR
ALT: 19 U/L (ref 0–44)
AST: 42 U/L — ABNORMAL HIGH (ref 15–41)
Albumin: 3.1 g/dL — ABNORMAL LOW (ref 3.5–5.0)
Alkaline Phosphatase: 70 U/L (ref 38–126)
Anion gap: 7 (ref 5–15)
BUN: <5 mg/dL — ABNORMAL LOW (ref 6–20)
CO2: 23 mmol/L (ref 22–32)
Calcium: 8.9 mg/dL (ref 8.9–10.3)
Chloride: 106 mmol/L (ref 98–111)
Creatinine, Ser: 0.36 mg/dL — ABNORMAL LOW (ref 0.44–1.00)
GFR, Estimated: >60 mL/min
Glucose, Bld: 109 mg/dL — ABNORMAL HIGH (ref 70–99)
Potassium: 4.2 mmol/L (ref 3.5–5.1)
Sodium: 136 mmol/L (ref 135–145)
Total Bilirubin: 0.9 mg/dL (ref 0.3–1.2)
Total Protein: 7.8 g/dL (ref 6.5–8.1)

## 2021-02-19 LAB — SURGICAL PATHOLOGY

## 2021-02-19 LAB — METHYLMALONIC ACID, SERUM: Methylmalonic Acid, Quantitative: 128 nmol/L (ref 0–378)

## 2021-02-19 MED ORDER — METOPROLOL TARTRATE 50 MG PO TABS
50.0000 mg | ORAL_TABLET | Freq: Two times a day (BID) | ORAL | Status: DC
  Administered 2021-02-19 – 2021-02-24 (×11): 50 mg via ORAL
  Filled 2021-02-19 (×11): qty 1

## 2021-02-19 MED ORDER — ZINC OXIDE 40 % EX OINT
TOPICAL_OINTMENT | CUTANEOUS | Status: DC | PRN
  Filled 2021-02-19 (×3): qty 57

## 2021-02-19 NOTE — Progress Notes (Signed)
Neurology Progress Note  S: She is excited because she is doing better. States she walked and could "feel her feet on the floor". Says her pain in LEs has resolved. She has been taking "all those vitamins". Reminded her to continue them at home.   O: Current vital signs: BP (!) 148/94 (BP Location: Left Arm)   Pulse 82   Temp 98.8 F (37.1 C) (Oral)   Resp 14   Ht 5\' 1"  (1.549 m)   Wt 41.7 kg   LMP  (LMP Unknown) Comment: Last menstrual period was 8-9 years ago  SpO2 98%   BMI 17.37 kg/m  Vital signs in last 24 hours: Temp:  [97.5 F (36.4 C)-99 F (37.2 C)] 98.8 F (37.1 C) (06/24 1330) Pulse Rate:  [75-120] 82 (06/24 1330) Resp:  [14] 14 (06/24 1330) BP: (120-157)/(91-108) 148/94 (06/24 1330) SpO2:  [96 %-98 %] 98 % (06/24 1330)  GENERAL: Awake, alert in NAD. HEENT: Normocephalic and atraumatic LUNGS: Normal respiratory effort.  CV: RRR on tele.  ABDOMEN: Soft, nontender. Ext: warm. Right ring finger continues to be bruised. She is moving it well.  Psych: Her affect is lighter compared to this NP's last exam.   NEURO:  Mental Status: AA&Ox3  Speech/Language: speech is without aphasia or dysarthria.  Naming, repetition, fluency, and comprehension intact.  Cranial Nerves:  II: PERRL.  III, IV, VI: EOMI. Eyelids elevate symmetrically.  V: Sensation is intact to light touch and symmetrical to face.  VII: Smile is symmetrical.  VIII: hearing intact to voice. IX, X: Palate elevates symmetrically. Phonation is normal.  08-06-1986 shrug 5/5. XII: tongue is midline without fasciculations. Motor: BUEs 4+/5 grips, biceps, and triceps. BLEs 4/5 thigh, 4+/5 knee, 3+/5 dorsi/plantar flexion.  Tone: is normal and bulk is normal Sensation- Intact to light touch bilaterally. Extinction absent to light touch to DSS.  DTRs: UEs 1+, LEs patella 0.  Gait- deferred  Medications  Current Facility-Administered Medications:    acetaminophen (TYLENOL) tablet 650 mg, 650 mg, Oral,  Q6H PRN, 650 mg at 02/17/21 2302 **OR** acetaminophen (TYLENOL) suppository 650 mg, 650 mg, Rectal, Q6H PRN, 2303, Tyrone A, DO   artificial tears (LACRILUBE) ophthalmic ointment, , Both Eyes, Q4H PRN, Regalado, Belkys A, MD   calcium carbonate (OS-CAL - dosed in mg of elemental calcium) tablet 500 mg of elemental calcium, 1 tablet, Oral, BID WC, Regalado, Belkys A, MD, 500 mg of elemental calcium at 02/19/21 0843   feeding supplement (ENSURE ENLIVE / ENSURE PLUS) liquid 237 mL, 237 mL, Oral, BID BM, Regalado, Belkys A, MD, 237 mL at 02/19/21 0843   folic acid (FOLVITE) tablet 1 mg, 1 mg, Oral, Daily, 02/21/21 M, RPH, 1 mg at 02/19/21 02/21/21   hydrALAZINE (APRESOLINE) tablet 25 mg, 25 mg, Oral, Q8H PRN, Regalado, Belkys A, MD   Immune Globulin 10% (PRIVIGEN) IV infusion 20 g, 400 mg/kg, Intravenous, Daily, [id number], MD, Last Rate: 120 mL/hr at 02/19/21 1107, Rate Change at 02/19/21 1107   liver oil-zinc oxide (DESITIN) 40 % ointment, , Topical, PRN, Pahwani, Ravi, MD   LORazepam (ATIVAN) tablet 0.5 mg, 0.5 mg, Oral, Q6H PRN, 02/21/21, Tyrone A, DO, 0.5 mg at 02/19/21 0752   magnesium oxide (MAG-OX) tablet 200 mg, 200 mg, Oral, BID, Regalado, Belkys A, MD, 200 mg at 02/19/21 0842   metoprolol tartrate (LOPRESSOR) tablet 50 mg, 50 mg, Oral, BID, Pahwani, Ravi, MD, 50 mg at 02/19/21 02/21/21   multivitamin with minerals tablet 1 tablet, 1 tablet, Oral,  Daily, Erick Blinks, MD, 1 tablet at 02/19/21 0842   pantoprazole (PROTONIX) EC tablet 40 mg, 40 mg, Oral, BID, Hessie Knows, RPH, 40 mg at 02/19/21 1245   polyethylene glycol (MIRALAX / GLYCOLAX) packet 17 g, 17 g, Oral, Daily, Regalado, Belkys A, MD, 17 g at 02/18/21 1208   potassium chloride (KLOR-CON) packet 40 mEq, 40 mEq, Oral, Daily, Regalado, Belkys A, MD, 40 mEq at 02/19/21 0842   sucralfate (CARAFATE) 1 GM/10ML suspension 1 g, 1 g, Oral, TID WC & HS, Regalado, Belkys A, MD, 1 g at 02/19/21 1207   thiamine tablet 100 mg, 100 mg,  Oral, Daily, Pahwani, Ravi, MD, 100 mg at 02/19/21 0842   traMADol (ULTRAM) tablet 50 mg, 50 mg, Oral, Q6H PRN, Regalado, Belkys A, MD, 50 mg at 02/19/21 0851   vitamin B-12 (CYANOCOBALAMIN) tablet 2,000 mcg, 2,000 mcg, Oral, Daily, Caryl Pina, MD, 2,000 mcg at 02/19/21 0842  No new pertinent Labs.  No new Imaging.  Assessment: 60 yo female who presented 11 days ago for n/v, vision changes, and weakness. She had multiple metabolic derangements on admission and several vitamin deficiencies. + UTI. LP was unrevealing. MRI of C and Tspine show no findings consistent with transverse myelitis or demyelinating disease. LP was unremarkable. 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 However, CIDP was also on the differential and patient agreed to IVIG. She has made good progress with this. IVIG Day 5 is 02/20/21.   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   Plan: -continue IVIG. #4/5 today. #5/5 tomorrow, then stop.   -continue all vitamin supplements as ordered on discharge.  -Agree with PT recommendations of SNF on discharge.  -Neurology will be available for questions as needed.  -f/up out patient with [facility] Neu[facility] Dr. Tyson Alias within one month.   Pt seen by Jimmye Norman, MSN, APN-BC/Nurse Practitioner/Neuro and later by MD. Note and plan to be edited as needed by MD.  Pager: [phone]  NEUROHOSPITALIST ADDENDUM Performed a face to face diagnostic evaluation.   I have reviewed the contents of history and physical exam as documented by PA/ARNP/Resident and agree with above documentation.  I have discussed and formulated the above plan as documented. Edits to the note have been made as needed.  Impression/Key exam findings/Plan: Weakness and numbness continues to improve. Unclear if 2/2 IVIG or from replenishing the  nutritional deficiency. So far, no clear evidence of an autoimmune phenomena. Will do last dose of IVIG and okay to discharge with follow up with Neuromuscular team at [facility] for an outpatient EMG/NCS and further workup and treatment. We will signoff.  Erick Blinks, MD [facility] [phone]   If 7pm to 7am, please call on call as listed on AMION.

## 2021-02-19 NOTE — Progress Notes (Signed)
PROGRESS NOTE    [name] [name]  BTD:[id number] DOB: May 06, 1961 DOA: 02/08/2021 PCP: Pcp, No   Brief Narrative: 60 year old with no significant past medical history who presents complaining of nausea vomiting and vision changes and weakness.  She reports intermittent nausea and vomiting for the last 3 months.  Reports intermittent blurry vision.  She denies headache.  She reports worsening numbness and tingling in her hands and feet.  She feels weak all over.  Evaluation in the ED: Patient was found to have a potassium of 2.4.  UA consistent with UTI.  Patient was admitted with severe hypokalemia and UTI.  She was treated with antibiotics.  Electrolytes were repleted.  She was also found to have anemia and thrombocytopenia, for which she underwent bone marrow biopsy with nonspecific findings.  Additional studies are pending including FISH for MDS and cytogenetics.  She will need to follow-up with oncology as an outpatient.  Initially her overall weakness improved, subsequently she reported worsening of lower extremity weakness.  She underwent MRI which was negative for stroke, ventriculomegaly and chronic microvascular ischemia.  Neurology was consulted.  She is underwent MRI of cervical and thoracic spine unrevealing.  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  Patient underwent lumbar puncture on 02/16/2021.  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   Assessment & Plan:   Active Problems:   Hypokalemia   Lower urinary tract infectious disease   Thrombocytopenia (HCC)   Dietary folate deficiency anemia  1-Extremities weakness:  Lower extremities weakness worse than upper extremities weakness but it is equal and bilateral.   -Numbness and tingling stable.  -Started T06 and folic acid.  -MRI negative for stroke.  -MRI cervical and thoracic unrevealing.  -Underwent LP  6/21. 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  Oligoclonal bands present in CSF.  Negative CSF IgG.  Gram stain negative, normal CSF protein. Labs Pending; Anti- GM1, MMA, Homocysteine 26, Thiamine level, copper, vitamin E; B 6   Patient has been started on IVIG by neurology on 02/16/2021 for total of 5 days with last dose scheduled on 02/20/2021 and per neurology, she can be discharged after that.  Hypokalemia, Hyponatremia, Hypocalcemia: All are within normal range.  Continue to replace daily and check daily.  2-Anemia; Thrombocytopenia;  Seen by oncology.  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  Patient received 2 units of platelets transfusion before LP on 02/16/2021.  3-Epigastric pain;  CT abdomen ; possible gastritis, lipase negative Might need GI follow up. Endoscopy/. Patient decline inpatient evaluation.  C diff negative.  Continue PPI and Carafate.  Abdominal pain resolved.  5-UTI; urine culture grew E. coli.  Completed 5 days ceftriaxone.   6-Mild transaminases; CT hepatic steatosis. Hepatomegaly.   7-HTN; blood pressure slightly elevated, increasing metoprolol today.  8-Blurry vision, decrease vision right Eye;  Ophthalmology consulted. Patient diagnose with dry eye, glaucoma. Follow up out patient.   9-sinus tachycardia: Has been having intermittent sinus tachycardia.  Increased her Lopressor to 50 mg twice daily.  Estimated body mass index is 17.37 kg/m as calculated from the following:   Height as of this encounter: _0  (1.549 m).  Weight as of this encounter: 41.7 kg.   DVT prophylaxis: SCD Code Status: Partial Code; no intubation  Family  Communication: Care discussed with patient.  Disposition Plan:  Status is: Inpatient  Remains inpatient appropriate because:IV treatments appropriate due to intensity of illness or inability to take PO  Dispo: The patient is from: home              Anticipated d/c is to: Home              Patient currently is not medically stable to d/c.   Difficult to place patient No        Consultants:  Neurology  Procedures:  LP  Antimicrobials:    Subjective: Received a message from the primary nurse that patient was confused and hallucinating.  Patient was seen 10 minutes later by me.  Patient was fully alert and oriented and she recognized me and the conversation that I had with her yesterday and day before.   Objective: Vitals:   02/19/21 1029 02/19/21 1052 02/19/21 1108 02/19/21 1205  BP: (!) 120/98 (!) 127/99 (!) 147/91   Pulse: (!) 112 75 77 78  Resp:      Temp: 98.2 F (36.8 C) 98.2 F (36.8 C) 98.5 F (36.9 C) (!) 97.5 F (36.4 C)  TempSrc: Oral Oral Oral Oral  SpO2:      Weight:      Height:        Intake/Output Summary (Last 24 hours) at 02/19/2021 1313 Last data filed at 02/19/2021 1200 Gross per 24 hour  Intake 860 ml  Output 2100 ml  Net -1240 ml    Filed Weights   02/08/21 1210 02/17/21 0500  Weight: 49.9 kg 41.7 kg    Examination: General exam: Appears calm and comfortable  Respiratory system: Clear to auscultation. Respiratory effort normal. Cardiovascular system: S1 & S2 heard, RRR. No JVD, murmurs, rubs, gallops or clicks. No pedal edema. Gastrointestinal system: Abdomen is nondistended, soft and nontender. No organomegaly or masses felt. Normal bowel sounds heard. Central nervous system: Alert and oriented. No focal neurological deficits.  Generalized weakness all 4 extremities. Extremities: Symmetric 5 x 5 power. Skin: No rashes, lesions or ulcers.  Psychiatry: Judgement and insight appear normal. Mood & affect appropriate.    Data Reviewed:  I have personally reviewed following labs and imaging studies  CBC: Recent Labs  Lab 02/15/21 2042 02/16/21 0424 02/17/21 0339 02/18/21 0404 02/19/21 0325  WBC 7.7 5.9 7.3 6.8 7.5  NEUTROABS 5.2  --   --  4.3 5.5  HGB 8.4* 8.3* 8.7* 8.5* 9.3*  HCT 24.5* 25.1* 26.7* 26.3* 28.1*  MCV 104.3* 105.0* 108.1* 109.6* 106.4*  PLT 60* 61* 81* 71* 517    Basic Metabolic Panel: Recent Labs  Lab 02/15/21 0906 02/16/21 0424 02/17/21 0339 02/18/21 0404 02/19/21 0325  NA 136 138 138 136 136  K 3.6 3.3* 4.0 4.4 4.2  CL 105 107 109 107 106  CO2 _0 GLUCOSE 110* 100* 95 106* 109*  BUN <5* <5* <5* 7 <5*  CREATININE 0.41* 0.40* 0.48 0.51 0.36*  CALCIUM 8.5* 8.5* 8.8* 8.8* 8.9  MG 2.0 1.8 1.9 2.0 1.9    GFR: Estimated Creatinine Clearance: 49.8 mL/min (A) (by C-G formula based on SCr of 0.36 mg/dL (L)). Liver Function Tests: Recent Labs  Lab 02/16/21 0740 02/18/21 0404 02/19/21 0325  AST  --  46* 42*  ALT  --  22 19  ALKPHOS  --  70 70  BILITOT  --  0.7 0.9  PROT  --  7.1 7.8  ALBUMIN 3.2* 3.0* 3.1*    No results for input(s): LIPASE, AMYLASE in the last 168 hours.  No results for input(s): AMMONIA in the last 168 hours. Coagulation Profile: No results for input(s): INR, PROTIME in the last 168 hours. Cardiac Enzymes: No results for input(s): CKTOTAL, CKMB, CKMBINDEX, TROPONINI in the last 168 hours. BNP (last 3 results) No results for input(s): PROBNP in the last 8760 hours. HbA1C: No results for input(s): HGBA1C in the last 72 hours. CBG: No results for input(s): GLUCAP in the last 168 hours. Lipid Profile: No results for input(s): CHOL, HDL, LDLCALC, TRIG, CHOLHDL, LDLDIRECT in the last 72 hours. Thyroid Function Tests: No results for input(s): TSH, T4TOTAL, FREET4, T3FREE, THYROIDAB in the last 72 hours.  Anemia Panel: No results for input(s): VITAMINB12, FOLATE, FERRITIN, TIBC, IRON, RETICCTPCT in the last 72 hours.  Sepsis Labs: No results for  input(s): PROCALCITON, LATICACIDVEN in the last 168 hours.  Recent Results (from the past 240 hour(s))  C Difficile Quick Screen w PCR reflex     Status: None   Collection Time: 02/09/21 11:10 PM   Specimen: STOOL  Result Value Ref Range Status   C Diff antigen NEGATIVE NEGATIVE Final   C Diff toxin NEGATIVE NEGATIVE Final   C Diff interpretation No C. difficile detected.  Final    Comment: Performed at [facility], [place] [address], [place], [place] [address]  Gram stain     Status: None   Collection Time: 02/15/21  5:54 PM   Specimen: PATH Cytology CSF; Cerebrospinal Fluid  Result Value Ref Range Status   Specimen Description CSF  Final   Special Requests NONE  Final   Gram Stain   Final    NO ORGANISMS SEEN NO WBC SEEN RESULT CALLED TO, READ BACK BY AND VERIFIED WITH: RN [facility] AT 2011 02/15/21 [place] A Performed at [facility], [place] [address] [place], [place] [address]    Report Status 02/15/2021 FINAL  Final          Radiology Studies: No results found.      Scheduled Meds:  calcium carbonate  1 tablet Oral BID WC   feeding supplement  237 mL Oral BID BM   folic acid  1 mg Oral Daily   magnesium oxide  200 mg Oral BID   metoprolol tartrate  50 mg Oral BID   multivitamin with minerals  1 tablet Oral Daily   pantoprazole  40 mg Oral BID   polyethylene glycol  17 g Oral Daily   potassium chloride  40 mEq Oral Daily   sucralfate  1 g Oral TID WC & HS   thiamine  100 mg Oral Daily   vitamin B-12  2,000 mcg Oral Daily   Continuous Infusions:  Immune Globulin 10% 120 mL/hr at 02/19/21 1107      LOS: 11 days    Time spent: 28 minutes.   Darliss Cheney, MD [facility]   If 7PM-7AM, please contact night-coverage [email or web address]  02/19/2021, 1:13 PM

## 2021-02-19 NOTE — Progress Notes (Signed)
PT Cancellation Note  Patient Details Name: [name] [name] MRN: [id number] DOB: 06-26-1961   Cancelled Treatment:    Reason Eval/Treat Not Completed: Medical issues which prohibited therapy. Pt with BP elevated today, RN states to defer PT during IVIG infusion.    [facility] 02/19/2021, 11:16 AM

## 2021-02-19 NOTE — Plan of Care (Signed)
  Problem: Health Behavior/Discharge Planning: Goal: Ability to manage health-related needs will improve Outcome: Progressing   

## 2021-02-20 LAB — CBC WITH DIFFERENTIAL/PLATELET
Abs Immature Granulocytes: 0.02 10*3/uL (ref 0.00–0.07)
Basophils Absolute: 0.1 10*3/uL (ref 0.0–0.1)
Basophils Relative: 1 %
Eosinophils Absolute: 0.2 10*3/uL (ref 0.0–0.5)
Eosinophils Relative: 3 %
HCT: 28.7 % — ABNORMAL LOW (ref 36.0–46.0)
Hemoglobin: 9.2 g/dL — ABNORMAL LOW (ref 12.0–15.0)
Immature Granulocytes: 0 %
Lymphocytes Relative: 18 %
Lymphs Abs: 1.3 10*3/uL (ref 0.7–4.0)
MCH: 34.2 pg — ABNORMAL HIGH (ref 26.0–34.0)
MCHC: 32.1 g/dL (ref 30.0–36.0)
MCV: 106.7 fL — ABNORMAL HIGH (ref 80.0–100.0)
Monocytes Absolute: 0.6 10*3/uL (ref 0.1–1.0)
Monocytes Relative: 9 %
Neutro Abs: 4.9 10*3/uL (ref 1.7–7.7)
Neutrophils Relative %: 69 %
Platelets: 268 10*3/uL (ref 150–400)
RBC: 2.69 MIL/uL — ABNORMAL LOW (ref 3.87–5.11)
RDW: 16.6 % — ABNORMAL HIGH (ref 11.5–15.5)
WBC: 7.1 10*3/uL (ref 4.0–10.5)
nRBC: 0 % (ref 0.0–0.2)

## 2021-02-20 LAB — COMPREHENSIVE METABOLIC PANEL
ALT: 20 U/L (ref 0–44)
AST: 45 U/L — ABNORMAL HIGH (ref 15–41)
Albumin: 2.8 g/dL — ABNORMAL LOW (ref 3.5–5.0)
Alkaline Phosphatase: 70 U/L (ref 38–126)
Anion gap: 5 (ref 5–15)
BUN: 8 mg/dL (ref 6–20)
CO2: 24 mmol/L (ref 22–32)
Calcium: 8.9 mg/dL (ref 8.9–10.3)
Chloride: 103 mmol/L (ref 98–111)
Creatinine, Ser: 0.56 mg/dL (ref 0.44–1.00)
GFR, Estimated: >60 mL/min (ref >60)
Glucose, Bld: 100 mg/dL — ABNORMAL HIGH (ref 70–99)
Potassium: 4.1 mmol/L (ref 3.5–5.1)
Sodium: 132 mmol/L — ABNORMAL LOW (ref 135–145)
Total Bilirubin: 0.8 mg/dL (ref 0.3–1.2)
Total Protein: 8.2 g/dL — ABNORMAL HIGH (ref 6.5–8.1)

## 2021-02-20 LAB — HAPTOGLOBIN: Haptoglobin: 122 mg/dL (ref 33–346)

## 2021-02-20 LAB — MAGNESIUM: Magnesium: 2 mg/dL (ref 1.7–2.4)

## 2021-02-20 MED ORDER — AMLODIPINE BESYLATE 5 MG PO TABS
5.0000 mg | ORAL_TABLET | Freq: Every day | ORAL | Status: DC
  Administered 2021-02-20 – 2021-02-27 (×8): 5 mg via ORAL
  Filled 2021-02-20 (×8): qty 1

## 2021-02-20 NOTE — Progress Notes (Signed)
Physical Therapy Treatment Patient Details Name: [name] [name] MRN: [id number] DOB: 1961-01-29 Today's Date: 02/20/2021    History of Present Illness 60 year old with no significant past medical history who presents complaining of nausea vomiting and vision changes and weakness.  She reports intermittent nausea and vomiting for the last 3 months.  Reports intermittent blurry vision. She reports worsening numbness and tingling in her hands and feet. She feels weak all over.  Pt found to be hypokalemic and with UTI. MRI revealed: 1. No acute infarct.  2. Ventriculomegaly, which may be ex vacuo in the setting of  moderate atrophy. Normal pressure hydrocephalus could have a similar  appearance in the correct clinical setting.  3. Mild to moderate T2/FLAIR hyperintensities within the white  matter. This finding is nonspecific but can be seen in the setting  of chronic microvascular ischemia, a demyelinating process such as multiple sclerosis, chronic migraines, or as the sequelae of a prior  infectious or inflammatory process. LP negative. 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 Oriented however appears to have had cognitive decline, including but not limited to decr attention, decr  decr memory, poor insight into current situation and deficits. Pt required decr assist with bed mobility (min assist with PT) however was max assist during OT session earlier. Pt refusing to eat lunch, asleep on PT arrival and falls asleep before PT exits room. May benefit from psych consult (?).  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 able to return to supien with min assist to elevate LEs with LE wts in place. pt able to scoot superiorly in bed in boost mode with step by step cues for sequence, LE support    Transfers Overall transfer level: Needs assistance   Transfers: Sit to/from Stand Sit to Stand: Min assist;From elevated surface         General transfer comment: pt refused stand attempt or OOB d/t fatigue. lateral scooting along EOB attempted however pt unabel to follow commands (scooting forward) therefore terminated attempt  Ambulation/Gait                 Stairs             Wheelchair Mobility    Modified Rankin (Stroke Patients Only)       Balance Overall balance assessment: Needs assistance Sitting-balance support: Feet supported;Bilateral upper extremity supported Sitting balance-Leahy Scale: Fair Sitting balance - Comments: worked on anterior &  lateral wt shifting with multi-modal cues, for reaching/wt shift out of BOS. postural/core/ exercises in sitting   Standing balance support: Bilateral upper extremity supported Standing balance-Leahy Scale: Zero Standing balance comment: unable/declined                            Cognition Arousal/Alertness:  (very sleepy) Behavior During Therapy:  Flat affect Overall Cognitive Status: Impaired/Different from baseline Area of Impairment: Attention;Following commands;Problem solving;Safety/judgement                   Current Attention Level: Sustained   Following Commands: Follows one step commands with increased time;Follows multi-step commands inconsistently Safety/Judgement: Decreased awareness of safety;Decreased awareness of deficits   Problem Solving: Slow processing;Decreased  initiation;Difficulty sequencing;Requires verbal cues;Requires tactile cues General Comments: pt oriented to hospital, self, day of week. decr situational awareness, unable to tell me why her hospital stay has been prolonged, doe snot recall havign had therapy of having any difficulty moving around      Exercises      General Comments        Pertinent Vitals/Pain Pain Assessment: No/denies pain Faces Pain Scale: Hurts little more Pain Location: Hands Pain Descriptors / Indicators: Discomfort;Grimacing;Guarding;Pins and needles;Sharp;Burning Pain Intervention(s): Limited activity within patient's tolerance;Monitored during session    Home Living                      Prior Function            PT Goals (current goals can now be found in the care plan section) Acute Rehab PT Goals Patient Stated Goal: doe snot state PT Goal Formulation: With patient Time For Goal Achievement: 02/24/21 Potential to Achieve Goals: Fair Progress towards PT goals: Progressing toward goals    Frequency    Min 2X/week      PT Plan Current plan remains appropriate;Frequency needs to be updated    Co-evaluation              AM-PAC PT "6 Clicks" Mobility   Outcome Measure  Help needed turning from your back to your side while in a flat bed without using bedrails?: A Lot Help needed moving from lying on your back to sitting on the side of a flat bed without using bedrails?: A Little Help needed moving to and from a bed to a chair (including a wheelchair)?: Total Help needed standing up from a chair using your arms (e.g., wheelchair or bedside chair)?: Total Help needed to walk in hospital room?: Total Help needed climbing 3-5 steps with a railing? : Total 6 Click Score: 9    End of Session Equipment Utilized During Treatment: Gait belt Activity Tolerance: Patient limited by fatigue Patient left: in bed;with call bell/phone within reach;with bed alarm set Nurse  Communication: Mobility status PT Visit Diagnosis: Unsteadiness on feet (R26.81);Other abnormalities of gait and mobility (R26.89);Difficulty in walking, not elsewhere classified (R26.2)     Time: 8295-6213 PT Time Calculation (min) (ACUTE ONLY): 29 min  Charges:  $Therapeutic Activity: 8-22 mins $Neuromuscular Re-education: 8-22 mins                     Delice Bison, PT  Acute Rehab Dept ([facility]) [phone] Pager [phone]  02/20/2021    [facility] 02/20/2021, 3:17 PM

## 2021-02-20 NOTE — Progress Notes (Signed)
PROGRESS NOTE    HORTENCE CHARTER  HQI:[id number] DOB: 09-02-1960 DOA: 02/08/2021 PCP: Pcp, No   Brief Narrative: 60 year old with no significant past medical history who presents complaining of nausea vomiting and vision changes and weakness.  She reports intermittent nausea and vomiting for the last 3 months.  Reports intermittent blurry vision.  She denies headache.  She reports worsening numbness and tingling in her hands and feet.  She feels weak all over.  Evaluation in the ED: Patient was found to have a potassium of 2.4.  UA consistent with UTI.  Patient was admitted with severe hypokalemia and UTI.  She was treated with antibiotics.  Electrolytes were repleted.  She was also found to have anemia and thrombocytopenia, for which she underwent bone marrow biopsy with nonspecific findings.  Additional studies are pending including FISH for MDS and cytogenetics.  She will need to follow-up with oncology as an outpatient.  Initially her overall weakness improved, subsequently she reported worsening of lower extremity weakness.  She underwent MRI which was negative for stroke, ventriculomegaly and chronic microvascular ischemia.  Neurology was consulted.  She is underwent MRI of cervical and thoracic spine unrevealing.  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  Patient underwent lumbar puncture on 02/16/2021.  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   Assessment & Plan:   Active Problems:   Hypokalemia   Lower urinary tract infectious disease   Thrombocytopenia (HCC)   Dietary folate deficiency anemia  1-Extremities weakness:  Lower extremities weakness worse than upper extremities weakness but it is equal and bilateral.   -Numbness and tingling stable.  -Started X32 and folic acid.  -MRI negative for stroke.  -MRI cervical and thoracic unrevealing.  -Underwent LP  6/21. 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  Oligoclonal bands present in CSF.  Negative CSF IgG.  Gram stain negative, normal CSF protein. Labs Pending; Anti- GM1, MMA, Homocysteine 26, Thiamine level, copper, vitamin E; B 6   Patient has been started on IVIG by neurology on 02/16/2021 for total of 5 days which she completed on 02/20/2021.  She is cleared from neurology for discharge.  She has improved overall.  Awaiting SNF placement arrangements by TOC.  Hypokalemia, Hyponatremia, Hypocalcemia: All are within normal range.  Continue to replace daily and check daily.  2-Anemia; Thrombocytopenia;  Seen by oncology.  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  Patient received 2 units of platelets transfusion before LP on 02/16/2021.  3-Epigastric pain;  CT abdomen ; possible gastritis, lipase negative Might need GI follow up. Endoscopy/. Patient decline inpatient evaluation.  C diff negative.  Continue PPI and Carafate.  Abdominal pain resolved.  5-UTI; urine culture grew E. coli.  Completed 5 days ceftriaxone.   6-Mild transaminases; CT hepatic steatosis. Hepatomegaly.  Labs are stable.  7-HTN; diastolic is still elevated.  Continue current metoprolol and add amlodipine 5 mg.  8-Blurry vision, decrease vision right Eye;  Ophthalmology consulted. Patient diagnose with dry eye, glaucoma. Follow up out patient.   9-sinus tachycardia: Resolved.  Continue current metoprolol.  Estimated body mass index is 18.95 kg/m as calculated from the following:   Height  as of this encounter: _0  (1.549 m).   Weight as of this encounter: 45.5 kg.   DVT prophylaxis: SCD Code  Status: Partial Code; no intubation  Family Communication: Care discussed with patient.  Disposition Plan:  Status is: Inpatient  Remains inpatient appropriate because:IV treatments appropriate due to intensity of illness or inability to take PO  Dispo: The patient is from: home              Anticipated d/c is to: SNF              Patient currently is  medically stable to d/c.   Difficult to place patient No        Consultants:  Neurology  Procedures:  LP  Antimicrobials:    Subjective: Seen and examined.  Fully alert and oriented.  No complaints.  Objective: Vitals:   02/19/21 1330 02/19/21 2052 02/20/21 0400 02/20/21 0500  BP: (!) 148/94 (!) 129/92 (!) 146/91   Pulse: 82     Resp: 14  13   Temp: 98.8 F (37.1 C) 98.3 F (36.8 C) 98.7 F (37.1 C)   TempSrc: Oral Oral    SpO2: 98%     Weight:    45.5 kg  Height:        Intake/Output Summary (Last 24 hours) at 02/20/2021 1134 Last data filed at 02/20/2021 0900 Gross per 24 hour  Intake 620 ml  Output 300 ml  Net 320 ml    Filed Weights   02/08/21 1210 02/17/21 0500 02/20/21 0500  Weight: 49.9 kg 41.7 kg 45.5 kg    Examination: General exam: Appears calm and comfortable  Respiratory system: Clear to auscultation. Respiratory effort normal. Cardiovascular system: S1 & S2 heard, RRR. No JVD, murmurs, rubs, gallops or clicks. No pedal edema. Gastrointestinal system: Abdomen is nondistended, soft and nontender. No organomegaly or masses felt. Normal bowel sounds heard. Central nervous system: Alert and oriented. No focal neurological deficits.  Generalized weakness in all 4 extremities which is equal. Extremities: Symmetric 5 x 5 power. Skin: No rashes, lesions or ulcers.  Psychiatry: Judgement and insight appear normal. Mood & affect appropriate.   Data Reviewed: I have personally reviewed following labs and imaging studies  CBC: Recent Labs  Lab 02/15/21 2042 02/16/21 0424 02/17/21 0339  02/18/21 0404 02/19/21 0325 02/20/21 0418  WBC 7.7 5.9 7.3 6.8 7.5 7.1  NEUTROABS 5.2  --   --  4.3 5.5 4.9  HGB 8.4* 8.3* 8.7* 8.5* 9.3* 9.2*  HCT 24.5* 25.1* 26.7* 26.3* 28.1* 28.7*  MCV 104.3* 105.0* 108.1* 109.6* 106.4* 106.7*  PLT 60* 61* 81* 71* 209 407    Basic Metabolic Panel: Recent Labs  Lab 02/16/21 0424 02/17/21 0339 02/18/21 0404 02/19/21 0325 02/20/21 0418  NA 138 138 136 136 132*  K 3.3* 4.0 4.4 4.2 4.1  CL 107 109 107 106 103  CO2 _1 GLUCOSE 100* 95 106* 109* 100*  BUN <5* <5* 7 <5* 8  CREATININE 0.40* 0.48 0.51 0.36* 0.56  CALCIUM 8.5* 8.8* 8.8* 8.9 8.9  MG 1.8 1.9 2.0 1.9 2.0    GFR: Estimated Creatinine Clearance: 54.4 mL/min (by C-G formula based on SCr of 0.56 mg/dL). Liver Function Tests: Recent Labs  Lab 02/16/21 0740 02/18/21 0404 02/19/21 0325 02/20/21 0418  AST  --  46* 42* 45*  ALT  --  _2 ALKPHOS  --  70 70 70  BILITOT  --  0.7 0.9 0.8  PROT  --  7.1 7.8 8.2*  ALBUMIN 3.2* 3.0* 3.1* 2.8*    No results for input(s): LIPASE, AMYLASE in the last 168 hours.  No results for input(s): AMMONIA in the last 168 hours. Coagulation Profile: No results for input(s): INR, PROTIME in the last 168 hours. Cardiac Enzymes: No results for input(s): CKTOTAL, CKMB, CKMBINDEX, TROPONINI in the last 168 hours. BNP (last 3 results) No results for input(s): PROBNP in the last 8760 hours. HbA1C: No results for input(s): HGBA1C in the last 72 hours. CBG: No results for input(s): GLUCAP in the last 168 hours. Lipid Profile: No results for input(s): CHOL, HDL, LDLCALC, TRIG, CHOLHDL, LDLDIRECT in the last 72 hours. Thyroid Function Tests: No results for input(s): TSH, T4TOTAL, FREET4, T3FREE, THYROIDAB in the last 72 hours.  Anemia Panel: No results for input(s): VITAMINB12, FOLATE, FERRITIN, TIBC, IRON, RETICCTPCT in the last 72 hours.  Sepsis Labs: No results for input(s): PROCALCITON, LATICACIDVEN in the last 168  hours.  Recent Results (from the past 240 hour(s))  Gram stain     Status: None   Collection Time: 02/15/21  5:54 PM   Specimen: PATH Cytology CSF; Cerebrospinal Fluid  Result Value Ref Range Status   Specimen Description CSF  Final   Special Requests NONE  Final   Gram Stain   Final    NO ORGANISMS SEEN NO WBC SEEN RESULT CALLED TO, READ BACK BY AND VERIFIED WITH: RN [facility] AT 2011 02/15/21 [place] Performed at [facility], [place] [address], [place], [place] [address]    Report Status 02/15/2021 FINAL  Final          Radiology Studies: No results found.      Scheduled Meds:  amLODipine  5 mg Oral Daily   calcium carbonate  1 tablet Oral BID WC   feeding supplement  237 mL Oral BID BM   folic acid  1 mg Oral Daily   magnesium oxide  200 mg Oral BID   metoprolol tartrate  50 mg Oral BID   multivitamin with minerals  1 tablet Oral Daily   pantoprazole  40 mg Oral BID   polyethylene glycol  17 g Oral Daily   potassium chloride  40 mEq Oral Daily   sucralfate  1 g Oral TID WC & HS   thiamine  100 mg Oral Daily   vitamin B-12  2,000 mcg Oral Daily   Continuous Infusions:      LOS: 12 days    Time spent: 27 minutes.   Darliss Cheney, MD [facility]   If 7PM-7AM, please contact night-coverage [email or web address]  02/20/2021, 11:34 AM

## 2021-02-20 NOTE — NC FL2 (Signed)
MEDICAID FL2 LEVEL OF CARE SCREENING TOOL     IDENTIFICATION  Patient Name: [name] [name] Birthdate: 1961/05/07 Sex: female Admission Date (Current Location): 02/08/2021  [facility] and [place] Number:  Producer, television/film/video and Address:  [facility],  [address] [place], [place] [address]      Provider Number: [id number]  Attending Physician Name and Address:  Hughie Closs, MD  Relative Name and Phone Number:  Osborne Oman (Sister)   [phone]    Current Level of Care: Hospital Recommended Level of Care: Skilled Nursing Facility Prior Approval Number:    Date Approved/Denied:   PASRR Number: [id number]  Discharge Plan: SNF    Current Diagnoses: Patient Active Problem List   Diagnosis Date Noted   Thrombocytopenia (HCC)    Dietary folate deficiency anemia    Lower urinary tract infectious disease    Hypokalemia 02/08/2021    Orientation RESPIRATION BLADDER Height & Weight     Self, Time, Situation, Place  Normal External catheter Weight: 45.5 kg Height:  5\' 1"  (154.9 cm)  BEHAVIORAL SYMPTOMS/MOOD NEUROLOGICAL BOWEL NUTRITION STATUS   (none)  (none) Continent Diet (see d/c summary)  AMBULATORY STATUS COMMUNICATION OF NEEDS Skin   Extensive Assist Verbally Normal                       Personal Care Assistance Level of Assistance  Bathing, Feeding, Dressing Bathing Assistance: Maximum assistance Feeding assistance: Independent Dressing Assistance: Limited assistance     Functional Limitations Info  Sight, Hearing, Speech Sight Info: Impaired Hearing Info: Adequate Speech Info: Adequate    SPECIAL CARE FACTORS FREQUENCY  PT (By licensed PT), OT (By licensed OT)     PT Frequency: 5X/W OT Frequency: 5X/W            Contractures Contractures Info: Not present    Additional Factors Info  Code Status, Allergies Code Status Info: Partial Allergies Info: NKA           Current Medications (02/20/2021):  This is the  current hospital active medication list Current Facility-Administered Medications  Medication Dose Route Frequency Provider Last Rate Last Admin   acetaminophen (TYLENOL) tablet 650 mg  650 mg Oral Q6H PRN 02/22/2021, Tyrone A, DO   650 mg at 02/17/21 2302   Or   acetaminophen (TYLENOL) suppository 650 mg  650 mg Rectal Q6H PRN 2303, Tyrone A, DO       amLODipine (NORVASC) tablet 5 mg  5 mg Oral Daily Ronaldo Miyamoto, MD   5 mg at 02/20/21 0849   artificial tears (LACRILUBE) ophthalmic ointment   Both Eyes Q4H PRN Regalado, Belkys A, MD       calcium carbonate (OS-CAL - dosed in mg of elemental calcium) tablet 500 mg of elemental calcium  1 tablet Oral BID WC Regalado, Belkys A, MD   500 mg of elemental calcium at 02/20/21 0850   feeding supplement (ENSURE ENLIVE / ENSURE PLUS) liquid 237 mL  237 mL Oral BID BM Regalado, Belkys A, MD   237 mL at 02/19/21 0843   folic acid (FOLVITE) tablet 1 mg  1 mg Oral Daily 02/21/21, RPH   1 mg at 02/20/21 02/22/21   hydrALAZINE (APRESOLINE) tablet 25 mg  25 mg Oral Q8H PRN Regalado, Belkys A, MD       liver oil-zinc oxide (DESITIN) 40 % ointment   Topical PRN Pahwani, 4081, MD       LORazepam (ATIVAN) tablet 0.5 mg  0.5 mg Oral Q6H PRN Ronaldo Miyamoto, Tyrone A, DO   0.5 mg at 02/20/21 0850   magnesium oxide (MAG-OX) tablet 200 mg  200 mg Oral BID Regalado, Belkys A, MD   200 mg at 02/20/21 0850   metoprolol tartrate (LOPRESSOR) tablet 50 mg  50 mg Oral BID Hughie Closs, MD   50 mg at 02/20/21 7915   multivitamin with minerals tablet 1 tablet  1 tablet Oral Daily Erick Blinks, MD   1 tablet at 02/20/21 0849   pantoprazole (PROTONIX) EC tablet 40 mg  40 mg Oral BID Hessie Knows, RPH   40 mg at 02/20/21 0858   polyethylene glycol (MIRALAX / GLYCOLAX) packet 17 g  17 g Oral Daily Regalado, Belkys A, MD   17 g at 02/18/21 1208   potassium chloride (KLOR-CON) packet 40 mEq  40 mEq Oral Daily Regalado, Belkys A, MD   40 mEq at 02/20/21 0849   sucralfate (CARAFATE) 1  GM/10ML suspension 1 g  1 g Oral TID WC & HS Regalado, Belkys A, MD   1 g at 02/20/21 1140   thiamine tablet 100 mg  100 mg Oral Daily Pahwani, Daleen Bo, MD   100 mg at 02/20/21 0569   traMADol (ULTRAM) tablet 50 mg  50 mg Oral Q6H PRN Regalado, Belkys A, MD   50 mg at 02/19/21 0851   vitamin B-12 (CYANOCOBALAMIN) tablet 2,000 mcg  2,000 mcg Oral Daily Caryl Pina, MD   2,000 mcg at 02/20/21 7948     Discharge Medications: Please see discharge summary for a list of discharge medications.  Relevant Imaging Results:  Relevant Lab Results:   Additional Information [phone] [place], [place]

## 2021-02-20 NOTE — TOC Progression Note (Signed)
Transition of Care [facility]) - Progression Note    Patient Details  Name: [name] [name] MRN: [id number] Date of Birth: March 12, 1961  Transition of Care [facility]) CM/SW Contact  Ida Rogue, [place] Phone Number: 02/20/2021, 1:57 PM  Clinical Narrative:   Patient seen in follow up to PT recommendation of SNF.  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  She recognizes she needs SNF due to weakness and is open to referral.  I explained bed search process.  Bed search initiated. TOC will continue to follow during the course of hospitalization.     Expected Discharge Plan: Skilled Nursing Facility Barriers to Discharge: SNF Pending bed offer  Expected Discharge Plan and Services Expected Discharge Plan: Skilled Nursing Facility   Discharge Planning Services: CM Consult Post Acute Care Choice: Skilled Nursing Facility Living arrangements for the past 2 months: Single Family Home                                       Social Determinants of Health (SDOH) Interventions    Readmission Risk Interventions No flowsheet data found.

## 2021-02-20 NOTE — Progress Notes (Signed)
Occupational Therapy Treatment Patient Details Name: [name] [name] MRN: [id number] DOB: [birth year] Today's Date: 02/20/2021    History of present illness 60 year old with no significant past medical history who presents complaining of nausea vomiting and vision changes and weakness.  She reports intermittent nausea and vomiting for the last 3 months.  Reports intermittent blurry vision. She reports worsening numbness and tingling in her hands and feet. She feels weak all over.  Pt found to be hypokalemic and with UTI. MRI revealed: 1. No acute infarct.  2. Ventriculomegaly, which may be ex vacuo in the setting of  moderate atrophy. Normal pressure hydrocephalus could have a similar  appearance in the correct clinical setting.  3. Mild to moderate T2/FLAIR hyperintensities within the white  matter. This finding is nonspecific but can be seen in the setting  of chronic microvascular ischemia, a demyelinating process such as multiple sclerosis, chronic migraines, or as the sequelae of a prior  infectious or inflammatory process. 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 Pt initially did well with Min As to stand on Stedy but became quickly anxious and strongly requested return to bed despite encouragement to move to [facility]. Pt is much changed since this OT evaluated her ADLs and functional mobility, and unfortunately pt has shown a regression in her 6-Clicks Occupational Performance score due to increased assistance needs and decreased activity tolerance for OOB.  Patient demonstrates fair rehab potential and could benefit  from continued skilled OT to increase safety and independence with ADLs and functional transfers to allow pt to return home safely and reduce caregiver burden and fall risk.      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 Pt returned to supine with Min As to LEs.    Transfers Overall transfer level: Needs assistance   Transfers: Sit to/from Stand Sit to Stand: Min assist;From elevated surface         General transfer comment: Pt stood quickly on Corene Cornea with mulitmodal cues for hand placement which required 3 repitions as pt removing hands from correct placement and returning to unsafe position. Pt stood for <10 sec then became very anxious re: bowel needs and assisted back to EOB with Min As.    Balance Overall balance assessment: Needs assistance Sitting-balance support: Feet supported;Bilateral upper extremity supported Sitting balance-Leahy Scale: Fair Sitting balance - Comments: fair - close min guard on EOB. Movements unpredicable.   Standing balance support: Bilateral  upper extremity supported Standing balance-Leahy Scale: Poor Standing balance comment: reliant on UE support,  and external assist                           ADL either performed or assessed with clinical judgement   ADL        Grooming: Brushing hair;Total assistance Grooming Details (indicate cue type and reason): Pt using BUEs on bed for balance while sitting EOB and received Total Assist to comb hair.                     Toileting- Clothing Manipulation and Hygiene: Total assistance;Bed level Toileting - Clothing Manipulation Details (indicate cue type and reason): Pt on pure wick and voided just before mobilization. Once standing pt became very anxious and expressed urgent bowel needs. Pt helped back to EOB to help calm and offered to use Corene Cornea for [facility] t/f. Pt refused a 2nd stand stating "it's too much" and was assisted back to bed and onto bedpan.  RN notified of pt on bedpan as pt asked for increased time. Call bell in pt's hand and pt cued to use once ready for hygiene.     Functional mobility during ADLs: Maximal assistance;Moderate assistance General ADL Comments: Corene Cornea     Vision Patient Visual Report: Blurring of vision     Perception     Praxis      Cognition Arousal/Alertness: Awake/alert Behavior During Therapy: Flat affect;Anxious Overall Cognitive Status: Impaired/Different from baseline Area of Impairment: Problem solving                             Problem Solving: Decreased initiation;Requires verbal cues (Pt given 2 choices throughout session re: her preferences. Each choice pt unable to state answer and defaulted to "I don't know".) General Comments: Predominantly oriented. Appears to have some forgetfulness. able to state that she is a widow and the year of her spouse's death.        Exercises     Shoulder Instructions       General Comments      Pertinent Vitals/ Pain       Pain Assessment: Faces (Pt denied pain at rest.) Faces Pain Scale: Hurts little more Pain Location: Hands Pain Descriptors / Indicators: Discomfort;Grimacing;Guarding;Pins and needles;Sharp;Burning Pain Intervention(s): Limited activity within patient's  tolerance;Monitored during session  Home Living                                          Prior Functioning/Environment              Frequency  Min 2X/week        Progress Toward Goals  OT Goals(current goals can now be found in the care plan section)  Progress towards OT goals: Not progressing toward goals - comment  Acute Rehab OT Goals Patient Stated Goal: regain independence OT Goal Formulation: With patient Time For Goal Achievement: 02/24/21 Potential to Achieve Goals: Fair  Plan Discharge plan remains appropriate    Co-evaluation                 AM-PAC OT "6 Clicks" Daily Activity     Outcome Measure   Help from another person eating meals?: A Little Help from another person taking care of personal grooming?:  Total Help from another person toileting, which includes using toliet, bedpan, or urinal?: Total Help from another person bathing (including washing, rinsing, drying)?: A Lot Help from another person to put on and taking off regular upper body clothing?: A Lot Help from another person to put on and taking off regular lower body clothing?: Total 6 Click Score: 10    End of Session Equipment Utilized During Treatment: Gait belt  OT Visit Diagnosis: Unsteadiness on feet (R26.81);Other abnormalities of gait and mobility (R26.89);Other symptoms and signs involving the nervous system (R29.898);History of falling (Z91.81);Repeated falls (R29.6);Low vision, both eyes (H54.2)   Activity Tolerance  (Limited by neurological symptoms and anxiety.)   Patient Left in bed;with call bell/phone within reach;with bed alarm set   Nurse Communication  (On bed pan)        Time: 1225-1250 OT Time Calculation (min): 25 min  Charges: OT General Charges $OT Visit: 1 Visit OT Treatments $Self Care/Home Management : 8-22 mins $Therapeutic Activity: 8-22 mins  Victorino Dike, OT Acute Rehab Services Office: [phone] 02/20/2021   Theodoro Clock 02/20/2021, 1:42 PM

## 2021-02-21 LAB — COMPREHENSIVE METABOLIC PANEL
ALT: 19 U/L (ref 0–44)
AST: 43 U/L — ABNORMAL HIGH (ref 15–41)
Albumin: 2.8 g/dL — ABNORMAL LOW (ref 3.5–5.0)
Alkaline Phosphatase: 68 U/L (ref 38–126)
Anion gap: 4 — ABNORMAL LOW (ref 5–15)
BUN: 7 mg/dL (ref 6–20)
CO2: 26 mmol/L (ref 22–32)
Calcium: 8.8 mg/dL — ABNORMAL LOW (ref 8.9–10.3)
Chloride: 102 mmol/L (ref 98–111)
Creatinine, Ser: 0.52 mg/dL (ref 0.44–1.00)
GFR, Estimated: >60 mL/min (ref >60)
Glucose, Bld: 99 mg/dL (ref 70–99)
Potassium: 4.1 mmol/L (ref 3.5–5.1)
Sodium: 132 mmol/L — ABNORMAL LOW (ref 135–145)
Total Bilirubin: 1.1 mg/dL (ref 0.3–1.2)
Total Protein: 8.2 g/dL — ABNORMAL HIGH (ref 6.5–8.1)

## 2021-02-21 LAB — CBC WITH DIFFERENTIAL/PLATELET
Abs Immature Granulocytes: 0.02 10*3/uL (ref 0.00–0.07)
Basophils Absolute: 0.1 10*3/uL (ref 0.0–0.1)
Basophils Relative: 1 %
Eosinophils Absolute: 0.2 10*3/uL (ref 0.0–0.5)
Eosinophils Relative: 4 %
HCT: 27.3 % — ABNORMAL LOW (ref 36.0–46.0)
Hemoglobin: 8.9 g/dL — ABNORMAL LOW (ref 12.0–15.0)
Immature Granulocytes: 0 %
Lymphocytes Relative: 23 %
Lymphs Abs: 1.4 10*3/uL (ref 0.7–4.0)
MCH: 34.4 pg — ABNORMAL HIGH (ref 26.0–34.0)
MCHC: 32.6 g/dL (ref 30.0–36.0)
MCV: 105.4 fL — ABNORMAL HIGH (ref 80.0–100.0)
Monocytes Absolute: 0.7 10*3/uL (ref 0.1–1.0)
Monocytes Relative: 11 %
Neutro Abs: 3.8 10*3/uL (ref 1.7–7.7)
Neutrophils Relative %: 61 %
Platelets: 279 10*3/uL (ref 150–400)
RBC: 2.59 MIL/uL — ABNORMAL LOW (ref 3.87–5.11)
RDW: 16.5 % — ABNORMAL HIGH (ref 11.5–15.5)
WBC: 6.2 10*3/uL (ref 4.0–10.5)
nRBC: 0 % (ref 0.0–0.2)

## 2021-02-21 LAB — VITAMIN B6: Vitamin B6: 4.2 ug/L (ref 3.4–65.2)

## 2021-02-21 LAB — MAGNESIUM: Magnesium: 2 mg/dL (ref 1.7–2.4)

## 2021-02-21 LAB — VITAMIN E
Vitamin E (Alpha Tocopherol): 10.4 mg/L (ref 7.0–25.1)
Vitamin E(Gamma Tocopherol): 2.1 mg/L (ref 0.5–5.5)

## 2021-02-21 NOTE — TOC Progression Note (Signed)
Transition of Care [facility]) - Progression Note    Patient Details  Name: [name] [name] MRN: [id number] Date of Birth: [birth year]  Transition of Care [facility]) CM/SW Contact  Maevis Mumby, Olegario Messier, RN Phone Number: 02/21/2021, 2:09 PM  Clinical Narrative: Currently no bed offers.      Expected Discharge Plan: Skilled Nursing Facility Barriers to Discharge: SNF Pending bed offer  Expected Discharge Plan and Services Expected Discharge Plan: Skilled Nursing Facility   Discharge Planning Services: CM Consult Post Acute Care Choice: Skilled Nursing Facility Living arrangements for the past 2 months: Single Family Home                                       Social Determinants of Health (SDOH) Interventions    Readmission Risk Interventions No flowsheet data found.

## 2021-02-21 NOTE — Plan of Care (Signed)

## 2021-02-21 NOTE — Progress Notes (Signed)
PROGRESS NOTE    [name] [name]  CWU:[id number] DOB: 1961/03/18 DOA: 02/08/2021 PCP: Pcp, No   Brief Narrative: 60 year old with no significant past medical history who presents complaining of nausea vomiting and vision changes and weakness.  She reports intermittent nausea and vomiting for the last 3 months.  Reports intermittent blurry vision.  She denies headache.  She reports worsening numbness and tingling in her hands and feet.  She feels weak all over.  Evaluation in the ED: Patient was found to have a potassium of 2.4.  UA consistent with UTI.  Patient was admitted with severe hypokalemia and UTI.  She was treated with antibiotics.  Electrolytes were repleted.  She was also found to have anemia and thrombocytopenia, for which she underwent bone marrow biopsy with nonspecific findings.  Additional studies are pending including FISH for MDS and cytogenetics.  She will need to follow-up with oncology as an outpatient.  Initially her overall weakness improved, subsequently she reported worsening of lower extremity weakness.  She underwent MRI which was negative for stroke, ventriculomegaly and chronic microvascular ischemia.  Neurology was consulted.  She is underwent MRI of cervical and thoracic spine unrevealing.  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  Patient underwent lumbar puncture on 02/16/2021.  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   Assessment & Plan:   Active Problems:   Hypokalemia   Lower urinary tract infectious disease   Thrombocytopenia (HCC)   Dietary folate deficiency anemia  1-Extremities weakness:  Lower extremities weakness worse than upper extremities weakness but it is equal and bilateral.   -Numbness and tingling stable.  -Started T88 and folic acid.  -MRI negative for stroke.  -MRI cervical and thoracic unrevealing.  -Underwent LP  6/21. 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  Oligoclonal bands present in CSF.  Negative CSF IgG.  Gram stain negative, normal CSF protein. Labs Pending; Anti- GM1, MMA, Homocysteine 26, Thiamine level, copper, vitamin E; B 6   Patient has been started on IVIG by neurology on 02/16/2021 for total of 5 days which she completed on 02/20/2021.  She is cleared from neurology for discharge.  She has improved overall.  Awaiting SNF placement arrangements by TOC.  Hypokalemia, Hyponatremia, Hypocalcemia: All are within normal range.  Continue to replace daily and check daily.  2-Anemia; Thrombocytopenia;  Seen by oncology.  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  Patient received 2 units of platelets transfusion before LP on 02/16/2021.  Patient's platelets have been within normal range since 02/19/2021.  3-Epigastric pain;  CT abdomen ; possible gastritis, lipase negative Might need GI follow up. Endoscopy/. Patient decline inpatient evaluation.  C diff negative.  Continue PPI and Carafate.  Abdominal pain resolved.  5-UTI; urine culture grew E. coli.  Completed 5 days ceftriaxone.   6-Mild transaminases; CT hepatic steatosis. Hepatomegaly.  Labs are stable.  7-HTN; diastolic is still elevated.  Continue current metoprolol and add amlodipine 5 mg.  8-Blurry vision, decrease vision right Eye;  Ophthalmology consulted.  She was seen by Dr. Alanda Slim on 02/12/2021.  Patient diagnose with dry eye, glaucoma.  He did not rule out the possibility of  vitamin deficiency causing arthropathy as well.  Recommended follow up out patient in 1 month.  9-sinus tachycardia: Resolved.  Continue  current metoprolol.  10.  Rectal bleeding/internal hemorrhoids: Patient had intermittent rectal bleeding yesterday and day before.  Hemoglobin has remained stable.  Patient tells me that she has a history of internal hemorrhoids and this is not uncommon for her.  At this point in time, she does not want [place] to do anything for that.  Estimated body mass index is 18.12 kg/m as calculated from the following:   Height as of this encounter: 5' 1"  (1.549 m).   Weight as of this encounter: 43.5 kg.   DVT prophylaxis: SCD Code Status: Partial Code; no intubation  Family Communication: Care discussed with patient.  Disposition Plan:  Status is: Inpatient  Remains inpatient appropriate because:IV treatments appropriate due to intensity of illness or inability to take PO  Dispo: The patient is from: home              Anticipated d/c is to: SNF              Patient currently is  medically stable to d/c.   Difficult to place patient No        Consultants:  Neurology  Procedures:  LP  Antimicrobials:    Subjective: Patient seen and examined earlier.  She was very upset for not having her breakfast delivered until I saw her and it was already 1 and half hour that she ordered her breakfast.  She was also upset about lack of attention that she believes she is getting from the nursing staff.  She is upset because she is in the hospital for too long.  She wants to leave the hospital as soon as possible.  She also talk to me about leaving home and living with her mother instead of waiting in the hospital for 1-2 more days to go to SNF.  I apologized for how she felt that she had received care from nursing staff.  She was calm down afterwards.  She denied having any complaints.  2 of her friends/sisters came to the room later on and requested to talk to me at the bedside.  I talked to them around 11:45 AM.  They had several questions which were answered to the best of their satisfaction.  For the  first time ever in last 6 days, patient mention anything about her vision which she never did in last 6 days.  Per her, her vision has improved only a little bit but she still cannot see clearly.  She was reminded of the ophthalmologist visit and recommendations.  She was then commenced.  We spent about 20 minutes answering questions.  We went over all the previous and current labs in the chart.  Objective: Vitals:   02/20/21 1216 02/20/21 1958 02/21/21 0500 02/21/21 1229  BP: (!) 128/91 129/89 (!) 137/96 (!) 128/102  Pulse: 86 (!) 103 89 80  Resp: 12 14 13 20   Temp: 98.5 F (36.9 C) 98.5 F (36.9 C) 97.8 F (36.6 C) (!) 96.4 F (35.8 C)  TempSrc:  Oral Oral Oral  SpO2: 95%   98%  Weight:   43.5 kg   Height:        Intake/Output Summary (Last 24 hours) at 02/21/2021 1320 Last data filed at 02/21/2021 1100 Gross per 24 hour  Intake 1596 ml  Output 351 ml  Net 1245 ml    Filed Weights   02/17/21 0500 02/20/21  0500 02/21/21 0500  Weight: 41.7 kg 45.5 kg 43.5 kg    Examination: General exam: Appears calm and comfortable  Respiratory system: Clear to auscultation. Respiratory effort normal. Cardiovascular system: S1 & S2 heard, RRR. No JVD, murmurs, rubs, gallops or clicks. No pedal edema. Gastrointestinal system: Abdomen is nondistended, soft and nontender. No organomegaly or masses felt. Normal bowel sounds heard. Central nervous system: Alert and oriented. No focal neurological deficits. Extremities: Symmetric 5 x 5 power. Skin: No rashes, lesions or ulcers.  Psychiatry: Judgement and insight appear normal. Mood & affect appropriate.   Data Reviewed: I have personally reviewed following labs and imaging studies  CBC: Recent Labs  Lab 02/15/21 2042 02/16/21 0424 02/17/21 0339 02/18/21 0404 02/19/21 0325 02/20/21 0418 02/21/21 0334  WBC 7.7   < > 7.3 6.8 7.5 7.1 6.2  NEUTROABS 5.2  --   --  4.3 5.5 4.9 3.8  HGB 8.4*   < > 8.7* 8.5* 9.3* 9.2* 8.9*  HCT 24.5*   < >  26.7* 26.3* 28.1* 28.7* 27.3*  MCV 104.3*   < > 108.1* 109.6* 106.4* 106.7* 105.4*  PLT 60*   < > 81* 71* 209 268 279   < > = values in this interval not displayed.    Basic Metabolic Panel: Recent Labs  Lab 02/17/21 0339 02/18/21 0404 02/19/21 0325 02/20/21 0418 02/21/21 0334  NA 138 136 136 132* 132*  K 4.0 4.4 4.2 4.1 4.1  CL 109 107 106 103 102  CO2 24 25 23 24 26   GLUCOSE 95 106* 109* 100* 99  BUN <5* 7 <5* 8 7  CREATININE 0.48 0.51 0.36* 0.56 0.52  CALCIUM 8.8* 8.8* 8.9 8.9 8.8*  MG 1.9 2.0 1.9 2.0 2.0    GFR: Estimated Creatinine Clearance: 52 mL/min (by C-G formula based on SCr of 0.52 mg/dL). Liver Function Tests: Recent Labs  Lab 02/16/21 0740 02/18/21 0404 02/19/21 0325 02/20/21 0418 02/21/21 0334  AST  --  46* 42* 45* 43*  ALT  --  22 19 20 19   ALKPHOS  --  70 70 70 68  BILITOT  --  0.7 0.9 0.8 1.1  PROT  --  7.1 7.8 8.2* 8.2*  ALBUMIN 3.2* 3.0* 3.1* 2.8* 2.8*    No results for input(s): LIPASE, AMYLASE in the last 168 hours.  No results for input(s): AMMONIA in the last 168 hours. Coagulation Profile: No results for input(s): INR, PROTIME in the last 168 hours. Cardiac Enzymes: No results for input(s): CKTOTAL, CKMB, CKMBINDEX, TROPONINI in the last 168 hours. BNP (last 3 results) No results for input(s): PROBNP in the last 8760 hours. HbA1C: No results for input(s): HGBA1C in the last 72 hours. CBG: No results for input(s): GLUCAP in the last 168 hours. Lipid Profile: No results for input(s): CHOL, HDL, LDLCALC, TRIG, CHOLHDL, LDLDIRECT in the last 72 hours. Thyroid Function Tests: No results for input(s): TSH, T4TOTAL, FREET4, T3FREE, THYROIDAB in the last 72 hours.  Anemia Panel: No results for input(s): VITAMINB12, FOLATE, FERRITIN, TIBC, IRON, RETICCTPCT in the last 72 hours.  Sepsis Labs: No results for input(s): PROCALCITON, LATICACIDVEN in the last 168 hours.  Recent Results (from the past 240 hour(s))  Gram stain     Status:  None   Collection Time: 02/15/21  5:54 PM   Specimen: PATH Cytology CSF; Cerebrospinal Fluid  Result Value Ref Range Status   Specimen Description CSF  Final   Special Requests NONE  Final   Gram Stain  Final    NO ORGANISMS SEEN NO WBC SEEN RESULT CALLED TO, READ BACK BY AND VERIFIED WITH: RN [facility] AT 2011 02/15/21 CRUICKSHANK A Performed at [facility], [place] [address], [place], [place] [address]    Report Status 02/15/2021 FINAL  Final          Radiology Studies: No results found.      Scheduled Meds:  amLODipine  5 mg Oral Daily   calcium carbonate  1 tablet Oral BID WC   feeding supplement  237 mL Oral BID BM   folic acid  1 mg Oral Daily   magnesium oxide  200 mg Oral BID   metoprolol tartrate  50 mg Oral BID   multivitamin with minerals  1 tablet Oral Daily   pantoprazole  40 mg Oral BID   polyethylene glycol  17 g Oral Daily   potassium chloride  40 mEq Oral Daily   sucralfate  1 g Oral TID WC & HS   thiamine  100 mg Oral Daily   vitamin B-12  2,000 mcg Oral Daily   Continuous Infusions:      LOS: 13 days    Time spent: 40 minutes.   Darliss Cheney, MD [facility]   If 7PM-7AM, please contact night-coverage [email or web address]  02/21/2021, 1:20 PM

## 2021-02-22 DIAGNOSIS — R000 Tachycardia, unspecified: Secondary | ICD-10-CM

## 2021-02-22 DIAGNOSIS — R29898 Other symptoms and signs involving the musculoskeletal system: Secondary | ICD-10-CM

## 2021-02-22 DIAGNOSIS — D649 Anemia, unspecified: Secondary | ICD-10-CM

## 2021-02-22 LAB — CBC WITH DIFFERENTIAL/PLATELET
Band Neutrophils: 1 %
Basophils Relative: 2 %
Blasts: NONE SEEN %
Eosinophils Relative: 4 %
HCT: 26.4 % — ABNORMAL LOW (ref 36.0–46.0)
Hemoglobin: 8.8 g/dL — ABNORMAL LOW (ref 12.0–15.0)
Lymphocytes Relative: 25 %
MCH: 34.8 pg — ABNORMAL HIGH (ref 26.0–34.0)
MCHC: 33.3 g/dL (ref 30.0–36.0)
MCV: 104.3 fL — ABNORMAL HIGH (ref 80.0–100.0)
Metamyelocytes Relative: NONE SEEN %
Monocytes Relative: 2 %
Myelocytes: NONE SEEN %
Neutrophils Relative %: 66 %
Platelets: 350 10*3/uL (ref 150–400)
Promyelocytes Relative: NONE SEEN %
RBC Morphology: NORMAL
RBC: 2.53 MIL/uL — ABNORMAL LOW (ref 3.87–5.11)
RDW: 16.2 % — ABNORMAL HIGH (ref 11.5–15.5)
WBC Morphology: NORMAL
WBC: 6.4 10*3/uL (ref 4.0–10.5)
nRBC: 0 % (ref 0.0–0.2)
nRBC: NONE SEEN /100 WBC

## 2021-02-22 LAB — MAGNESIUM: Magnesium: 2.1 mg/dL (ref 1.7–2.4)

## 2021-02-22 MED ORDER — WITCH HAZEL-GLYCERIN EX PADS: MEDICATED_PAD | CUTANEOUS | Status: DC | PRN

## 2021-02-22 MED ORDER — WITCH HAZEL-GLYCERIN EX PADS
MEDICATED_PAD | CUTANEOUS | Status: DC | PRN
  Filled 2021-02-22: qty 100

## 2021-02-22 NOTE — TOC Progression Note (Addendum)
Transition of Care [facility]) - Progression Note    Patient Details  Name: SUZZANNE BRUNKHORST MRN: [id number] Date of Birth: [birth year]  [facility]) CM/SW Contact  Darleene Cleaver, [place] Phone Number: 02/22/2021, 4:56 PM  Clinical Narrative:     CSW attempted to call patient in the room and on her cell phone to confirm it is okay to speak to her sister Earney Navy [phone].  CSW left a message on voice mail.  Patient currently does not have any bed offers.   Expected Discharge Plan: Skilled Nursing Facility Barriers to Discharge: SNF Pending bed offer  Expected Discharge Plan and Services Expected Discharge Plan: Skilled Nursing Facility   Discharge Planning Services: CM Consult Post Acute Care Choice: Skilled Nursing Facility Living arrangements for the past 2 months: Single Family Home                                       Social Determinants of Health (SDOH) Interventions    Readmission Risk Interventions No flowsheet data found.

## 2021-02-22 NOTE — Progress Notes (Signed)
   02/22/21 1853  Assess: MEWS Score  Temp 99 F (37.2 C)  BP (!) 125/91  Pulse Rate (!) 135  Resp 14  Level of Consciousness Alert  SpO2 99 %  O2 Device Room Air  Assess: if the MEWS score is Yellow or Red  Were vital signs taken at a resting state? Yes  Focused Assessment Change from prior assessment (see assessment flowsheet)  Does the patient meet 2 or more of the SIRS criteria? No  Does the patient have a confirmed or suspected source of infection? No  MEWS guidelines implemented *See Row Information* No, previously red, continue vital signs every 4 hours  Treat  MEWS Interventions Administered prn meds/treatments;Other (Comment) (Ativan 0.5mg  administered.)  Pain Scale 0-10  Pain Score 0  Complains of Anxiety;Restless  Take Vital Signs  Increase Vital Sign Frequency  Red: Q 1hr X 4 then Q 4hr X 4, if remains red, continue Q 4hrs  Escalate  MEWS: Escalate Red: discuss with charge nurse/RN and provider, consider discussing with RRT  Notify: Charge Nurse/RN  Name of Charge Nurse/RN Notified Casey/Joanne RN  Date Charge Nurse/RN Notified 02/22/21  Time Charge Nurse/RN Notified 1855  Notify: Provider  Provider Name/Title Hughie Closs MD  Date Provider Notified 02/22/21  Time Provider Notified [phone]  Notification Type Page  Notification Reason Other (Comment) (Elevated HR- 130-140)  Provider response Other (Comment) (Awaiting response)  Pt experiencing restlessness and anxiety. HR-130-140. Hughie Closs MD, Randa Evens and Baird Lyons (charge nurse) notified. PRN Ativan 0.5mg   administered. Pt HR - 112 improving reported off to oncoming nurse Marcie...Marland KitchenMarland KitchenMarland Kitchenwill follow with plan of care. Red Mews initiated per protocol.

## 2021-02-22 NOTE — Plan of Care (Signed)

## 2021-02-22 NOTE — Plan of Care (Signed)
  Problem: Clinical Measurements: Goal: Will remain free from infection Outcome: Progressing Goal: Respiratory complications will improve Outcome: Progressing   Problem: Clinical Measurements: Goal: Diagnostic test results will improve Outcome: Not Progressing   Problem: Activity: Goal: Risk for activity intolerance will decrease Outcome: Not Progressing   Problem: Nutrition: Goal: Adequate nutrition will be maintained Outcome: Not Progressing

## 2021-02-22 NOTE — Progress Notes (Signed)
PROGRESS NOTE    [name] [name]  WSF:[id number] DOB: August 01, 1961 DOA: 02/08/2021 PCP: Pcp, No   Brief Narrative: 60 year old with no significant past medical history who presents complaining of nausea vomiting and vision changes and weakness.  She reports intermittent nausea and vomiting for the last 3 months.  Reports intermittent blurry vision.  She denies headache.  She reports worsening numbness and tingling in her hands and feet.  She feels weak all over.  Evaluation in the ED: Patient was found to have a potassium of 2.4.  UA consistent with UTI.  Patient was admitted with severe hypokalemia and UTI.  She was treated with antibiotics.  Electrolytes were repleted.  She was also found to have anemia and thrombocytopenia, for which she underwent bone marrow biopsy with nonspecific findings.  Additional studies are pending including FISH for MDS and cytogenetics.  She will need to follow-up with oncology as an outpatient.  Initially her overall weakness improved, subsequently she reported worsening of lower extremity weakness.  She underwent MRI which was negative for stroke, ventriculomegaly and chronic microvascular ischemia.  Neurology was consulted.  She is underwent MRI of cervical and thoracic spine unrevealing.  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  Patient underwent lumbar puncture on 02/16/2021.  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   Assessment & Plan:   Active Problems:   Hypokalemia   Lower urinary tract infectious disease   Thrombocytopenia (HCC)   Dietary folate deficiency anemia  1-Extremities weakness:  Lower extremities weakness worse than upper extremities weakness but it is equal and bilateral.   -Numbness and tingling stable.  -Started Y17 and folic acid.  -MRI negative for stroke.  -MRI cervical and thoracic unrevealing.  -Underwent LP  6/21. 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  Oligoclonal bands present in CSF.  Negative CSF IgG.  Gram stain negative, normal CSF protein. Labs Pending; Anti- GM1, MMA, Homocysteine 26, Thiamine level, copper, vitamin E; B 6   Patient was started on IVIG for 5 days which she completed on 02/20/2021.  She is cleared from neurology for discharge.  They finally opined that her weakness is likely due to nutritional deficiency.  Patient is getting nutrition replacement which includes multivitamin, thiamine and B12 . she has improved overall.  Awaiting SNF placement arrangements by TOC.  2-Anemia; Thrombocytopenia;  Seen by oncology.  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  Patient received 2 units of platelets transfusion before LP on 02/16/2021.  Patient's platelets have been within normal range since 02/19/2021.  3-Epigastric pain;  CT abdomen ; possible gastritis, lipase negative Might need GI follow up. Endoscopy/. Patient decline inpatient evaluation.  C diff negative.  Continue PPI and Carafate.  Abdominal pain resolved.  5-UTI; urine culture grew E. coli.  Completed 5 days ceftriaxone.   6-Mild transaminases; CT hepatic steatosis. Hepatomegaly.  Labs are stable.  7-HTN; diastolic is still elevated.  Continue current metoprolol and add amlodipine 5 mg.  8-Blurry vision, decrease vision right Eye;  Ophthalmology consulted.  She was seen by Dr. Alanda Slim on 02/12/2021.  Patient diagnose with dry eye, glaucoma.  He did not rule out the possibility  of vitamin deficiency causing arthropathy as well.  Recommended follow up out patient in 1 month.  9-sinus tachycardia: Mostly  remains under control with intermittent elevation without symptoms.  Continue current metoprolol.  10.  Rectal bleeding/internal hemorrhoids: Patient had intermittent rectal bleeding for the past few days.  Hemoglobin has remained stable.  Patient tells me that she has a history of internal hemorrhoids and this is not uncommon for her.  At this point in time, she does not want [place] to do anything for that.  11. Hypokalemia, Hyponatremia, Hypocalcemia: All are within normal range.  Continue to replace daily and check daily.  Estimated body mass index is 18.08 kg/m as calculated from the following:   Height as of this encounter: 5' 1"  (1.549 m).   Weight as of this encounter: 43.4 kg.   DVT prophylaxis: SCD Code Status: Partial Code; no intubation  Family Communication: Care discussed with patient.  Disposition Plan:  Status is: Inpatient  Remains inpatient appropriate because:IV treatments appropriate due to intensity of illness or inability to take PO  Dispo: The patient is from: home              Anticipated d/c is to: SNF              Patient currently is  medically stable to d/c.   Difficult to place patient No        Consultants:  Neurology  Procedures:  LP  Antimicrobials:    Subjective: Seen and examined.  Complains of weakness and tingling in the fingers which is usual for her.  No other complaint today.  Remains frustrated for persistent but short delays in delivery of the food trays.  Objective: Vitals:   02/22/21 0500 02/22/21 0513 02/22/21 1025 02/22/21 1234  BP:  (!) 126/91 (!) 117/94 104/82  Pulse:  93 (!) 115 (!) 124  Resp:  12    Temp:  98.6 F (37 C)  99 F (37.2 C)  TempSrc:  Oral  Oral  SpO2:  93%    Weight: 43.4 kg     Height:        Intake/Output Summary (Last 24 hours) at 02/22/2021 1355 Last data filed at 02/21/2021 2146 Gross per 24 hour  Intake 360 ml  Output --  Net 360 ml    Filed Weights   02/20/21 0500 02/21/21 0500 02/22/21  0500  Weight: 45.5 kg 43.5 kg 43.4 kg    Examination: General exam: Appears calm and comfortable  Respiratory system: Clear to auscultation. Respiratory effort normal. Cardiovascular system: S1 & S2 heard, RRR. No JVD, murmurs, rubs, gallops or clicks. No pedal edema. Gastrointestinal system: Abdomen is nondistended, soft and nontender. No organomegaly or masses felt. Normal bowel sounds heard. Central nervous system: Alert and oriented. No focal neurological deficits. Extremities: Symmetric 5 x 5 power. Skin: No rashes, lesions or ulcers.  Psychiatry: Judgement and insight appear normal. Mood & affect appropriate.    Data Reviewed: I have personally reviewed following labs and imaging studies  CBC: Recent Labs  Lab 02/15/21 2042 02/16/21 0424 02/18/21 0404 02/19/21 0325 02/20/21 0418 02/21/21 0334 02/22/21 0326  WBC 7.7   < > 6.8 7.5 7.1 6.2 6.4  NEUTROABS 5.2  --  4.3 5.5 4.9 3.8  --   HGB 8.4*   < > 8.5* 9.3* 9.2* 8.9* 8.8*  HCT 24.5*   < > 26.3* 28.1* 28.7* 27.3* 26.4*  MCV 104.3*   < > 109.6* 106.4* 106.7* 105.4* 104.3*  PLT  60*   < > 71* 209 268 279 350   < > = values in this interval not displayed.    Basic Metabolic Panel: Recent Labs  Lab 02/17/21 0339 02/18/21 0404 02/19/21 0325 02/20/21 0418 02/21/21 0334 02/22/21 0326  NA 138 136 136 132* 132*  --   K 4.0 4.4 4.2 4.1 4.1  --   CL 109 107 106 103 102  --   CO2 24 25 23 24 26   --   GLUCOSE 95 106* 109* 100* 99  --   BUN <5* 7 <5* 8 7  --   CREATININE 0.48 0.51 0.36* 0.56 0.52  --   CALCIUM 8.8* 8.8* 8.9 8.9 8.8*  --   MG 1.9 2.0 1.9 2.0 2.0 2.1    GFR: Estimated Creatinine Clearance: 51.9 mL/min (by C-G formula based on SCr of 0.52 mg/dL). Liver Function Tests: Recent Labs  Lab 02/16/21 0740 02/18/21 0404 02/19/21 0325 02/20/21 0418 02/21/21 0334  AST  --  46* 42* 45* 43*  ALT  --  22 19 20 19   ALKPHOS  --  70 70 70 68  BILITOT  --  0.7 0.9 0.8 1.1  PROT  --  7.1 7.8 8.2* 8.2*  ALBUMIN  3.2* 3.0* 3.1* 2.8* 2.8*    No results for input(s): LIPASE, AMYLASE in the last 168 hours.  No results for input(s): AMMONIA in the last 168 hours. Coagulation Profile: No results for input(s): INR, PROTIME in the last 168 hours. Cardiac Enzymes: No results for input(s): CKTOTAL, CKMB, CKMBINDEX, TROPONINI in the last 168 hours. BNP (last 3 results) No results for input(s): PROBNP in the last 8760 hours. HbA1C: No results for input(s): HGBA1C in the last 72 hours. CBG: No results for input(s): GLUCAP in the last 168 hours. Lipid Profile: No results for input(s): CHOL, HDL, LDLCALC, TRIG, CHOLHDL, LDLDIRECT in the last 72 hours. Thyroid Function Tests: No results for input(s): TSH, T4TOTAL, FREET4, T3FREE, THYROIDAB in the last 72 hours.  Anemia Panel: No results for input(s): VITAMINB12, FOLATE, FERRITIN, TIBC, IRON, RETICCTPCT in the last 72 hours.  Sepsis Labs: No results for input(s): PROCALCITON, LATICACIDVEN in the last 168 hours.  Recent Results (from the past 240 hour(s))  Gram stain     Status: None   Collection Time: 02/15/21  5:54 PM   Specimen: PATH Cytology CSF; Cerebrospinal Fluid  Result Value Ref Range Status   Specimen Description CSF  Final   Special Requests NONE  Final   Gram Stain   Final    NO ORGANISMS SEEN NO WBC SEEN RESULT CALLED TO, READ BACK BY AND VERIFIED WITH: RN [facility] AT 2011 02/15/21 [place] A Performed at [facility] [place] [address] [place], [place] [address]    Report Status 02/15/2021 FINAL  Final          Radiology Studies: No results found.      Scheduled Meds:  amLODipine  5 mg Oral Daily   calcium carbonate  1 tablet Oral BID WC   feeding supplement  237 mL Oral BID BM   folic acid  1 mg Oral Daily   magnesium oxide  200 mg Oral BID   metoprolol tartrate  50 mg Oral BID   multivitamin with minerals  1 tablet Oral Daily   pantoprazole  40 mg Oral BID   polyethylene glycol  17 g Oral Daily    potassium chloride  40 mEq Oral Daily   sucralfate  1 g Oral  TID WC & HS   thiamine  100 mg Oral Daily   vitamin B-12  2,000 mcg Oral Daily   Continuous Infusions:    LOS: 14 days    Time spent: 28 minutes.   Darliss Cheney, MD [facility]   If 7PM-7AM, please contact night-coverage [email or web address]  02/22/2021, 1:55 PM

## 2021-02-23 LAB — CBC WITH DIFFERENTIAL/PLATELET
Abs Immature Granulocytes: 0.01 10*3/uL (ref 0.00–0.07)
Basophils Absolute: 0.1 10*3/uL (ref 0.0–0.1)
Basophils Relative: 2 %
Eosinophils Absolute: 0.2 10*3/uL (ref 0.0–0.5)
Eosinophils Relative: 4 %
HCT: 26.1 % — ABNORMAL LOW (ref 36.0–46.0)
Hemoglobin: 8.7 g/dL — ABNORMAL LOW (ref 12.0–15.0)
Immature Granulocytes: 0 %
Lymphocytes Relative: 27 %
Lymphs Abs: 1.4 10*3/uL (ref 0.7–4.0)
MCH: 34.7 pg — ABNORMAL HIGH (ref 26.0–34.0)
MCHC: 33.3 g/dL (ref 30.0–36.0)
MCV: 104 fL — ABNORMAL HIGH (ref 80.0–100.0)
Monocytes Absolute: 0.6 10*3/uL (ref 0.1–1.0)
Monocytes Relative: 12 %
Neutro Abs: 2.9 10*3/uL (ref 1.7–7.7)
Neutrophils Relative %: 55 %
Platelets: 349 10*3/uL (ref 150–400)
RBC: 2.51 MIL/uL — ABNORMAL LOW (ref 3.87–5.11)
RDW: 15.9 % — ABNORMAL HIGH (ref 11.5–15.5)
WBC: 5.3 10*3/uL (ref 4.0–10.5)
nRBC: 0 % (ref 0.0–0.2)

## 2021-02-23 LAB — MAGNESIUM: Magnesium: 2 mg/dL (ref 1.7–2.4)

## 2021-02-23 MED ORDER — ENSURE ENLIVE PO LIQD
237.0000 mL | Freq: Three times a day (TID) | ORAL | Status: DC
  Administered 2021-02-23 – 2021-02-27 (×10): 237 mL via ORAL

## 2021-02-23 NOTE — Plan of Care (Signed)
  Problem: Clinical Measurements: Goal: Will remain free from infection Outcome: Progressing Goal: Cardiovascular complication will be avoided Outcome: Progressing   Problem: Activity: Goal: Risk for activity intolerance will decrease Outcome: Not Progressing   Problem: Nutrition: Goal: Adequate nutrition will be maintained Outcome: Not Progressing   Problem: Clinical Measurements: Goal: Respiratory complications will improve Outcome: Adequate for Discharge

## 2021-02-23 NOTE — Progress Notes (Signed)
Physical Therapy Treatment Patient Details Name: [name] [name] MRN: [id number] DOB: 01/27/1961 Today's Date: 02/23/2021    History of Present Illness 60 year old with no significant past medical history who presents complaining of nausea vomiting and vision changes and weakness.  She reports intermittent nausea and vomiting for the last 3 months.  Reports intermittent blurry vision. She reports worsening numbness and tingling in her hands and feet. She feels weak all over.  Pt found to be hypokalemic and with UTI. MRI revealed: 1. No acute infarct.  2. Ventriculomegaly, which may be ex vacuo in the setting of  moderate atrophy. Normal pressure hydrocephalus could have a similar  appearance in the correct clinical setting.  3. Mild to moderate T2/FLAIR hyperintensities within the white  matter. This finding is nonspecific but can be seen in the setting  of chronic microvascular ischemia, a demyelinating process such as multiple sclerosis, chronic migraines, or as the sequelae of a prior  infectious or inflammatory process. LP negative. 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 She continues to report pain in bil hands and feet. Continues to require +2 for safe mobility. She is highly ataxic and remains at significant risk for falls. Worked on static and dynamic sitting balance, weightbearing, and functional mobility on today. She will need SNF for continued rehab.      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 Increased time. Assist for LEs and to stabilzie trunk. More difficulty with scooting.    Transfers Overall transfer level: Needs assistance Equipment used: 2 person hand held assist Transfers: Sit to/from Stand Sit to Stand: +2 physical assistance;+2 safety/equipment;Min assist Stand pivot transfers: Mod assist;+2 physical assistance;+2 safety/equipment       General transfer comment: Sit to stand x 3. Assist to power up, stabilize, block knees, advance LEs. Pt is very ataxic and at high risk for falls. Some improvement noted with each step but still significantly impaired. Stand pivot x 2.  Ambulation/Gait Ambulation/Gait assistance: Mod assist;+2 physical assistance;+2 safety/equipment           General Gait Details: Side steps along side of bed with pt holding on to therapists' arms. Pt is very ataxic and at high risk for falls.   Stairs             Wheelchair Mobility    Modified Rankin (Stroke Patients Only)       Balance Overall balance assessment: Needs assistance;History of Falls Sitting-balance support: Bilateral upper extremity supported;Feet supported Sitting balance-Leahy Scale: Fair     Standing balance support: Bilateral upper extremity supported Standing balance-Leahy Scale: Zero                              Cognition Arousal/Alertness: Awake/alert Behavior During Therapy: WFL for tasks assessed/performed Overall Cognitive Status: Within Functional Limits for tasks assessed (for the most part). Continues to require verbal and  tactile cues. She makes the occasional odd statement.                                         Exercises General Exercises - Lower Extremity Heel Slides: AROM;Both;5 reps;Supine (impaired control and coordination)    General Comments        Pertinent Vitals/Pain Pain Assessment: Faces Faces Pain Scale:  Hurts even more Pain Location: hands, feet (bilateral) Pain Descriptors / Indicators: Discomfort;Grimacing;Guarding;Pins and needles;Sharp;Burning Pain Intervention(s): Limited activity within patient's tolerance;Monitored during session;Repositioned    Home Living                      Prior Function            PT Goals (current goals can now be found in the care plan section) Progress towards PT goals: Progressing toward goals    Frequency    Min 2X/week      PT Plan Current plan remains appropriate    Co-evaluation              AM-PAC PT "6 Clicks" Mobility   Outcome Measure  Help needed turning from your back to your side while in a flat bed without using bedrails?: A Lot Help needed moving from lying on your back to sitting on the side of a flat bed without using bedrails?: A Lot Help needed moving to and from a bed to a chair (including a wheelchair)?: Total Help needed standing up from a chair using your arms (e.g., wheelchair or bedside chair)?: Total Help needed to walk in hospital room?: Total Help needed climbing 3-5 steps with a railing? : Total 6 Click Score: 8    End of Session Equipment Utilized During Treatment: Gait belt Activity Tolerance: Patient limited by fatigue;Patient limited by pain Patient left: in bed;with call bell/phone within reach;with bed alarm set   PT Visit Diagnosis: Ataxic gait (R26.0);History of falling (Z91.81);Unsteadiness on feet (R26.81);Muscle weakness (generalized) (M62.81);Difficulty in walking, not elsewhere classified (R26.2);Other symptoms and signs involving the nervous system (R29.898);Repeated falls (R29.6)     Time: 1615-1700 PT Time Calculation (min) (ACUTE ONLY): 45 min  Charges:  $Gait Training: 8-22 mins $Therapeutic Activity: 8-22 mins $Neuromuscular Re-education: 8-22 mins                        Faye Ramsay, PT Acute Rehabilitation  Office: [phone] Pager: [phone]

## 2021-02-23 NOTE — TOC Progression Note (Addendum)
Transition of Care [facility]) - Progression Note    Patient Details  Name: GEORGEAN SPAINHOWER MRN: [id number] Date of Birth: [birth year]  [facility] CM/SW Contact  Darleene Cleaver, [place] Phone Number: 02/23/2021, 3:43 PM  Clinical Narrative:     CSW spoke to patient and she gave permission for CSW to speak to her sister Vernona Rieger.  CSW received a phone call from patient's sister Earney Navy [phone] that she had found a facility in [place] where patient's family lives called [facility].  CSW asked patient if it is okay to make a referral to SNF, and she said that is fine.  CSW contacted Brianna in admissions, [phone],  she requested to have clinicals faxed to her.  CSW faxed requested clinicals to [phone].  CSW also contacted [facility], they do not take her insurance, CSW also contacted [facility] and had to leave a message.   Expected Discharge Plan: Skilled Nursing Facility Barriers to Discharge: SNF Pending bed offer  Expected Discharge Plan and Services Expected Discharge Plan: Skilled Nursing Facility   Discharge Planning Services: CM Consult Post Acute Care Choice: Skilled Nursing Facility Living arrangements for the past 2 months: Single Family Home                                       Social Determinants of Health (SDOH) Interventions    Readmission Risk Interventions No flowsheet data found.

## 2021-02-23 NOTE — Progress Notes (Signed)
Nutrition Follow-up  INTERVENTION:   -Continue Ensure Enlive po TID, each supplement provides 350 kcal and 20 grams of protein  -Magic cup TID with meals, each supplement provides 290 kcal and 9 grams of protein  -Meal order with assist  -Continue supplementation of B-12, thiamine, folic acid, calcium, MVI  NUTRITION DIAGNOSIS:   Severe Malnutrition related to acute illness, nausea, vomiting as evidenced by energy intake < or equal to 50% for > or equal to 5 days, severe fat depletion, severe muscle depletion.  N/V resolved. Intakes could improve.  GOAL:   Patient will meet greater than or equal to 90% of their needs  Progressing  MONITOR:   PO intake, Supplement acceptance, Labs, Weight trends, I & O's  ASSESSMENT:   60 yo female with no significant PMH who presents with UTI and altered lab values. She complains of nausea vomiting and vision changes and weakness. She reports intermittent nausea and vomiting for the last 3 months. Reports intermittent blurry vision. She denies headache. She reports worsening numbness and tingling in her hands and feet. She feels weak all over.  Patient in room ,reports feeling anxious, frustrated with her situation. Today states she is feeling pain after days of feeling better and hopes she is not going backwards again. Pt reports she cannot remember if she ate breakfast and her lunch was sitting in front of her untouched. States she forgot to order lunch so something was ordered for her. Offered to bring her an Ensure instead but she declines. Encouraged pt to eat regardless of appetite as she needs to improve her nutrition status. Encouraged her to continue to take her vitamin supplements but to think of her oral supplements as medicine as well. Pt very forgetful during interaction, her mental status could also be associated with B vitamin deficiency. Will change diet order to include meal order with assist. Pt not eating well and has poor PO for  3 months PTA d/t constant N/V.  Admission weight: 110 lbs -suspect this was stated Current weight: 95 lbs.  Medications: OSCAL, folic acid, MAG-OX, Multivitamin with minerals daily, Miralax, KLOR-CON, Carafate, Thiamine, Vitamin B-12  Labs reviewed.  NUTRITION - FOCUSED PHYSICAL EXAM:  Flowsheet Row Most Recent Value  Orbital Region Severe depletion  Upper Arm Region Severe depletion  Thoracic and Lumbar Region Unable to assess  Buccal Region Moderate depletion  Temple Region Severe depletion  Clavicle Bone Region Severe depletion  Clavicle and Acromion Bone Region Severe depletion  Scapular Bone Region Severe depletion  Patellar Region Moderate depletion  Posterior Calf Region Moderate depletion  Edema (RD Assessment) None  Hair Reviewed  [thinner]  Eyes Unable to assess  Mouth Reviewed  Skin Reviewed       Diet Order:   Diet Order             Diet regular Room service appropriate? Yes; Fluid consistency: Thin  Diet effective now                   EDUCATION NEEDS:   Education needs have been addressed  Skin:  Skin Assessment: Reviewed RN Assessment  Last BM:  6/27  Height:   Ht Readings from Last 1 Encounters:  02/08/21 5\' 1"  (1.549 m)    Weight:   Wt Readings from Last 1 Encounters:  02/22/21 43.4 kg    BMI:  Body mass index is 18.08 kg/m.  Estimated Nutritional Needs:   Kcal:  1750-1950  Protein:  70-85 grams  Fluid:  >1.75  [name] Presume, MS, RD, LDN Inpatient Clinical Dietitian Contact information available via Amion

## 2021-02-23 NOTE — Progress Notes (Signed)
PROGRESS NOTE    [name] [name]  FAO:[id number] DOB: November 24, 1960 DOA: 02/08/2021 PCP: Pcp, No   Brief Narrative: 60 year old with no significant past medical history who presents complaining of nausea vomiting and vision changes and weakness.  She reports intermittent nausea and vomiting for the last 3 months.  Reports intermittent blurry vision.  She denies headache.  She reports worsening numbness and tingling in her hands and feet.  She feels weak all over.  Evaluation in the ED: Patient was found to have a potassium of 2.4.  UA consistent with UTI.  Patient was admitted with severe hypokalemia and UTI.  She was treated with antibiotics.  Electrolytes were repleted.  She was also found to have anemia and thrombocytopenia, for which she underwent bone marrow biopsy with nonspecific findings.  Additional studies are pending including FISH for MDS and cytogenetics.  She will need to follow-up with oncology as an outpatient.  Initially her overall weakness improved, subsequently she reported worsening of lower extremity weakness.  She underwent MRI which was negative for stroke, ventriculomegaly and chronic microvascular ischemia.  Neurology was consulted.  She is underwent MRI of cervical and thoracic spine unrevealing.  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  Patient underwent lumbar puncture on 02/16/2021.  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   Assessment & Plan:   Active Problems:   Hypokalemia   Lower urinary tract infectious disease   Thrombocytopenia (HCC)   Dietary folate deficiency anemia   Anemia   Weakness of both lower extremities   Weakness of both upper extremities   Sinus tachycardia  1-Extremities weakness:  Lower extremities weakness worse than upper extremities weakness but it is equal and bilateral.   -Numbness and tingling stable.  -Started O96 and  folic acid.  -MRI negative for stroke.  -MRI cervical and thoracic unrevealing.  -Underwent LP 6/21. 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  Oligoclonal bands present in CSF.  Negative CSF IgG.  Gram stain negative, normal CSF protein. Labs Pending; Anti- GM1, MMA, Homocysteine 26, Thiamine level, copper, vitamin E; B 6   Patient was started on IVIG for 5 days which she completed on 02/20/2021.  She is cleared from neurology for discharge.  They finally opined that her weakness is likely due to nutritional deficiency.  Patient is getting nutrition replacement which includes multivitamin, thiamine and B12 . she has improved overall.  Awaiting SNF placement arrangements by TOC.  2-Anemia; Thrombocytopenia;  Seen by oncology.  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  Patient received 2 units of platelets transfusion before LP on 02/16/2021.  Patient's platelets have been within normal range since 02/19/2021.  3-Epigastric pain;  CT abdomen ; possible gastritis, lipase negative Might need GI follow up. Endoscopy/. Patient decline inpatient evaluation.  C diff negative.  Continue PPI and Carafate.  Abdominal pain resolved.  5-UTI; urine culture grew E. coli.  Completed 5 days ceftriaxone.   6-Mild transaminases; CT hepatic steatosis. Hepatomegaly.  Labs are stable.  7-HTN; diastolic is still elevated.  Continue current metoprolol and add amlodipine 5 mg.  8-Blurry vision, decrease vision right Eye;  Ophthalmology consulted.  She was  seen by Dr. Alanda Slim on 02/12/2021.  Patient diagnose with dry eye, glaucoma.  He did not rule out the possibility of vitamin deficiency causing  arthropathy as well.  Recommended follow up out patient in 1 month.  9-sinus tachycardia: Mostly remains under control with intermittent elevation without symptoms.  Likely due to anxiety bursts.  Continue current metoprolol.  10.  Rectal bleeding/internal hemorrhoids: Patient had intermittent rectal bleeding for the past few days.  Hemoglobin has remained stable.  Patient tells me that she has a history of internal hemorrhoids and this is not uncommon for her.  At this point in time, she does not want [place] to do anything for that.  11. Hypokalemia, Hyponatremia, Hypocalcemia: All are within normal range.  Continue to replace daily and check daily.  Estimated body mass index is 18.08 kg/m as calculated from the following:   Height as of this encounter: 5' 1"  (1.549 m).   Weight as of this encounter: 43.4 kg.   DVT prophylaxis: SCD Code Status: Partial Code; no intubation  Family Communication: Care discussed with patient.  Disposition Plan:  Status is: Inpatient  Remains inpatient appropriate because:IV treatments appropriate due to intensity of illness or inability to take PO  Dispo: The patient is from: home              Anticipated d/c is to: SNF              Patient currently is  medically stable to d/c.   Difficult to place patient No        Consultants:  Neurology  Procedures:  LP  Antimicrobials:    Subjective: Seen and examined.  No new complaint.  Objective: Vitals:   02/22/21 2150 02/22/21 2251 02/23/21 0453 02/23/21 1015  BP: 116/90 119/82 121/85 (!) 122/91  Pulse: 98 91 85 99  Resp: 15 16 15    Temp: 98.5 F (36.9 C) 98.3 F (36.8 C) 97.9 F (36.6 C)   TempSrc: Oral Oral Axillary   SpO2: 97% 96%    Weight:      Height:        Intake/Output Summary (Last 24 hours) at 02/23/2021 1104 Last data filed at 02/22/2021 2114 Gross per 24 hour  Intake 360 ml  Output --  Net 360 ml    Filed Weights   02/20/21 0500 02/21/21 0500 02/22/21 0500  Weight:  45.5 kg 43.5 kg 43.4 kg    Examination: General exam: Appears calm and comfortable  Respiratory system: Clear to auscultation. Respiratory effort normal. Cardiovascular system: S1 & S2 heard, RRR. No JVD, murmurs, rubs, gallops or clicks. No pedal edema. Gastrointestinal system: Abdomen is nondistended, soft and nontender. No organomegaly or masses felt. Normal bowel sounds heard. Central nervous system: Alert and oriented. No focal neurological deficits.  Global generalized weakness in all 4 extremities Skin: No rashes, lesions or ulcers.  Psychiatry: Judgement and insight appear poor, mood and affect upset  Data Reviewed: I have personally reviewed following labs and imaging studies  CBC: Recent Labs  Lab 02/18/21 0404 02/19/21 0325 02/20/21 0418 02/21/21 0334 02/22/21 0326 02/23/21 0318  WBC 6.8 7.5 7.1 6.2 6.4 5.3  NEUTROABS 4.3 5.5 4.9 3.8  --  2.9  HGB 8.5* 9.3* 9.2* 8.9* 8.8* 8.7*  HCT 26.3* 28.1* 28.7* 27.3* 26.4* 26.1*  MCV 109.6* 106.4* 106.7* 105.4* 104.3* 104.0*  PLT 71* 209 268 279 350 767    Basic Metabolic Panel: Recent Labs  Lab 02/17/21 0339 02/18/21 0404 02/19/21 0325 02/20/21 0418  02/21/21 0334 02/22/21 0326 02/23/21 0318  NA 138 136 136 132* 132*  --   --   K 4.0 4.4 4.2 4.1 4.1  --   --   CL 109 107 106 103 102  --   --   CO2 24 25 23 24 26   --   --   GLUCOSE 95 106* 109* 100* 99  --   --   BUN <5* 7 <5* 8 7  --   --   CREATININE 0.48 0.51 0.36* 0.56 0.52  --   --   CALCIUM 8.8* 8.8* 8.9 8.9 8.8*  --   --   MG 1.9 2.0 1.9 2.0 2.0 2.1 2.0    GFR: Estimated Creatinine Clearance: 51.9 mL/min (by C-G formula based on SCr of 0.52 mg/dL). Liver Function Tests: Recent Labs  Lab 02/18/21 0404 02/19/21 0325 02/20/21 0418 02/21/21 0334  AST 46* 42* 45* 43*  ALT 22 19 20 19   ALKPHOS 70 70 70 68  BILITOT 0.7 0.9 0.8 1.1  PROT 7.1 7.8 8.2* 8.2*  ALBUMIN 3.0* 3.1* 2.8* 2.8*    No results for input(s): LIPASE, AMYLASE in the last 168  hours.  No results for input(s): AMMONIA in the last 168 hours. Coagulation Profile: No results for input(s): INR, PROTIME in the last 168 hours. Cardiac Enzymes: No results for input(s): CKTOTAL, CKMB, CKMBINDEX, TROPONINI in the last 168 hours. BNP (last 3 results) No results for input(s): PROBNP in the last 8760 hours. HbA1C: No results for input(s): HGBA1C in the last 72 hours. CBG: No results for input(s): GLUCAP in the last 168 hours. Lipid Profile: No results for input(s): CHOL, HDL, LDLCALC, TRIG, CHOLHDL, LDLDIRECT in the last 72 hours. Thyroid Function Tests: No results for input(s): TSH, T4TOTAL, FREET4, T3FREE, THYROIDAB in the last 72 hours.  Anemia Panel: No results for input(s): VITAMINB12, FOLATE, FERRITIN, TIBC, IRON, RETICCTPCT in the last 72 hours.  Sepsis Labs: No results for input(s): PROCALCITON, LATICACIDVEN in the last 168 hours.  Recent Results (from the past 240 hour(s))  Gram stain     Status: None   Collection Time: 02/15/21  5:54 PM   Specimen: PATH Cytology CSF; Cerebrospinal Fluid  Result Value Ref Range Status   Specimen Description CSF  Final   Special Requests NONE  Final   Gram Stain   Final    NO ORGANISMS SEEN NO WBC SEEN RESULT CALLED TO, READ BACK BY AND VERIFIED WITH: RN [facility] AT 2011 02/15/21 Burton A Performed at [facility], [place] [address], [place], [place] [address]    Report Status 02/15/2021 FINAL  Final          Radiology Studies: No results found.      Scheduled Meds:  amLODipine  5 mg Oral Daily   calcium carbonate  1 tablet Oral BID WC   feeding supplement  237 mL Oral BID BM   folic acid  1 mg Oral Daily   magnesium oxide  200 mg Oral BID   metoprolol tartrate  50 mg Oral BID   multivitamin with minerals  1 tablet Oral Daily   pantoprazole  40 mg Oral BID   polyethylene glycol  17 g Oral Daily   potassium chloride  40 mEq Oral Daily   sucralfate  1 g Oral TID WC & HS   thiamine   100 mg Oral Daily   vitamin B-12  2,000 mcg Oral Daily   Continuous Infusions:    LOS: 15  days    Time spent: 26 minutes.   Darliss Cheney, MD [facility]   If 7PM-7AM, please contact night-coverage [email or web address]  02/23/2021, 11:04 AM

## 2021-02-23 NOTE — TOC Initial Note (Addendum)
Late entry  Transition of Care [facility]) - Initial/Assessment Note    Patient Details  Name: [name] [name] MRN: [id number] Date of Birth: 1961-07-26  [facility]) CM/SW Contact:    Darleene Cleaver, LCSW Phone Number:  02/19/2021, 4:51 PM  Clinical Narrative:                  Patient is a 60 year old female who is alert and oriented x4.  Patient lives alone, original plan was patient to go home with home health, then patient became worse, CIR was then recommended, CIR followed patient and then signed off, they did not feel that she does not appear to warrant CIR per note from 02/11/2021.  PT then recommended SNF placement, patient is agreeable and gave CSW permission to begin bed search.  Patient's clinicals were sent out to SNFs awaiting bed offers.  Expected Discharge Plan: Skilled Nursing Facility Barriers to Discharge: SNF Pending bed offer   Patient Goals and CMS Choice Patient states their goals for this hospitalization and ongoing recovery are:: Go to rehab   Choice offered to / list presented to : Patient  Expected Discharge Plan and Services Expected Discharge Plan: Skilled Nursing Facility   Discharge Planning Services: CM Consult Post Acute Care Choice: Skilled Nursing Facility Living arrangements for the past 2 months: Single Family Home                                      Prior Living Arrangements/Services Living arrangements for the past 2 months: Single Family Home Lives with:: Self Patient language and need for interpreter reviewed:: Yes        Need for Family Participation in Patient Care: No (Comment) Care giver support system in place?: No (comment)   Criminal Activity/Legal Involvement Pertinent to Current Situation/Hospitalization: No - Comment as needed  Activities of Daily Living Home Assistive Devices/Equipment: Eyeglasses ADL Screening (condition at time of admission) Patient's cognitive ability adequate to safely complete  daily activities?: Yes Is the patient deaf or have difficulty hearing?: No Does the patient have difficulty seeing, even when wearing glasses/contacts?: No Does the patient have difficulty concentrating, remembering, or making decisions?: No Patient able to express need for assistance with ADLs?: Yes Does the patient have difficulty dressing or bathing?: No Independently performs ADLs?: Yes (appropriate for developmental age) Does the patient have difficulty walking or climbing stairs?: Yes (secondary to weakness) Weakness of Legs: Both (bilateral feet feel like pins and needles) Weakness of Arms/Hands: None  Permission Sought/Granted                  Emotional Assessment Appearance:: Appears stated age Attitude/Demeanor/Rapport: Engaged Affect (typically observed): Appropriate Orientation: : Oriented to Self, Oriented to Place, Oriented to Situation Alcohol / Substance Use: Not Applicable Psych Involvement: No (comment)  Admission diagnosis:  Hypokalemia [E87.6] Lower urinary tract infectious disease [N39.0] Weakness [R53.1] Patient Active Problem List   Diagnosis Date Noted   Anemia 02/22/2021   Weakness of both lower extremities 02/22/2021   Weakness of both upper extremities 02/22/2021   Sinus tachycardia 02/22/2021   Thrombocytopenia (HCC)    Dietary folate deficiency anemia    Lower urinary tract infectious disease    Hypokalemia 02/08/2021   PCP:  Pcp, No Pharmacy:   [id number] [place], [place] - [address] [place] [address] Phone: [phone] Fax: [phone]  Social Determinants of Health (SDOH) Interventions    Readmission Risk Interventions No flowsheet data found.   

## 2021-02-24 DIAGNOSIS — E43 Unspecified severe protein-calorie malnutrition: Secondary | ICD-10-CM | POA: Insufficient documentation

## 2021-02-24 LAB — CBC WITH DIFFERENTIAL/PLATELET
Abs Immature Granulocytes: 0.03 10*3/uL (ref 0.00–0.07)
Basophils Absolute: 0.1 10*3/uL (ref 0.0–0.1)
Basophils Relative: 1 %
Eosinophils Absolute: 0.3 10*3/uL (ref 0.0–0.5)
Eosinophils Relative: 4 %
HCT: 26.7 % — ABNORMAL LOW (ref 36.0–46.0)
Hemoglobin: 8.5 g/dL — ABNORMAL LOW (ref 12.0–15.0)
Immature Granulocytes: 1 %
Lymphocytes Relative: 20 %
Lymphs Abs: 1.3 10*3/uL (ref 0.7–4.0)
MCH: 34.3 pg — ABNORMAL HIGH (ref 26.0–34.0)
MCHC: 31.8 g/dL (ref 30.0–36.0)
MCV: 107.7 fL — ABNORMAL HIGH (ref 80.0–100.0)
Monocytes Absolute: 0.8 10*3/uL (ref 0.1–1.0)
Monocytes Relative: 12 %
Neutro Abs: 4.1 10*3/uL (ref 1.7–7.7)
Neutrophils Relative %: 62 %
Platelets: 358 10*3/uL (ref 150–400)
RBC: 2.48 MIL/uL — ABNORMAL LOW (ref 3.87–5.11)
RDW: 15.7 % — ABNORMAL HIGH (ref 11.5–15.5)
WBC: 6.5 10*3/uL (ref 4.0–10.5)
nRBC: 0 % (ref 0.0–0.2)

## 2021-02-24 LAB — MAGNESIUM: Magnesium: 1.9 mg/dL (ref 1.7–2.4)

## 2021-02-24 MED ORDER — METOPROLOL TARTRATE 50 MG PO TABS
100.0000 mg | ORAL_TABLET | Freq: Two times a day (BID) | ORAL | Status: DC
  Administered 2021-02-24 – 2021-02-27 (×6): 100 mg via ORAL
  Filled 2021-02-24 (×6): qty 2

## 2021-02-24 NOTE — Progress Notes (Signed)
Occupational Therapy Treatment Patient Details Name: [name] [name] MRN: [id number] DOB: 02/25/1961 Today's Date: 02/24/2021    History of present illness 60 year old with no significant past medical history who presents complaining of nausea vomiting and vision changes and weakness.  She reports intermittent nausea and vomiting for the last 3 months.  Reports intermittent blurry vision. She reports worsening numbness and tingling in her hands and feet. She feels weak all over.  Pt found to be hypokalemic and with UTI. MRI revealed: 1. No acute infarct.  2. Ventriculomegaly, which may be ex vacuo in the setting of  moderate atrophy. Normal pressure hydrocephalus could have a similar  appearance in the correct clinical setting.  3. Mild to moderate T2/FLAIR hyperintensities within the white  matter. This finding is nonspecific but can be seen in the setting  of chronic microvascular ischemia, a demyelinating process such as multiple sclerosis, chronic migraines, or as the sequelae of a prior  infectious or inflammatory process. LP negative. 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 Patient able to don socks in long sit position in bed. Patient able to grossly brush her hair with modified comb - needing assistance for hair mats and tangles. Patient folded towel and wash cloths in bed working on overall functional use of upper extremities. Patient overall exhibits functional strength and coordination of upper extremities but continues to be limited by impaired sensation and low vision. Cont POC.   Follow Up Recommendations  SNF    Equipment Recommendations  3 in 1 bedside commode    Recommendations for Other Services      Precautions / Restrictions Precautions Precautions: Fall Precaution Comments: profound ataxia and generalized low vision R>L Restrictions Weight  Bearing Restrictions: No       Mobility Bed Mobility                    Transfers                      Balance                                           ADL either performed or assessed with clinical judgement   ADL Overall ADL's : Needs assistance/impaired     Grooming: Brushing hair;Moderate assistance Grooming Details (indicate cue type and reason): able to grossly brush hair - with comb that therapist modified - but unable to manage tangles. Therapsit assisted with matting/tangled hair.             Lower Body Dressing: Bed level;Set up Lower Body Dressing Details (indicate cue type and reason): able to don yellow socks in bed with increased time.                     Vision Baseline Vision/History: Wears glasses Wears Glasses: Reading only Patient Visual Report: Blurring of vision Additional Comments: low vision   Perception     Praxis      Cognition Arousal/Alertness: Awake/alert Behavior During Therapy: WFL for tasks assessed/performed Overall Cognitive Status: Within Functional Limits for tasks assessed  General Comments: Oriented to self, that she is in the hospital (not which one) and why. Is able to grossly tell me the plan is to go to rehab at discharge. She appears to have some cognitive impairement with memory,        Exercises Other Exercises Other Exercises: Worked on functional use of UEs by folding towel and wash cloths which patient did grossly well. Limited by vision and poor contrast between towels and cover.   Shoulder Instructions       General Comments      Pertinent Vitals/ Pain       Pain Assessment: Faces Faces Pain Scale: Hurts little more Pain Location: hands, feet (bilateral) Pain Descriptors / Indicators: Other (Comment) (sensative) Pain Intervention(s): Limited activity within patient's tolerance;Monitored during session  Home Living                                           Prior Functioning/Environment              Frequency  Min 2X/week        Progress Toward Goals  OT Goals(current goals can now be found in the care plan section)  Progress towards OT goals: Progressing toward goals  Acute Rehab OT Goals OT Goal Formulation: With patient Time For Goal Achievement: 03/10/21 Potential to Achieve Goals: Fair  Plan Discharge plan remains appropriate    Co-evaluation          OT goals addressed during session: ADL's and self-care      AM-PAC OT "6 Clicks" Daily Activity     Outcome Measure   Help from another person eating meals?: A Little Help from another person taking care of personal grooming?: A Lot Help from another person toileting, which includes using toliet, bedpan, or urinal?: Total Help from another person bathing (including washing, rinsing, drying)?: A Lot Help from another person to put on and taking off regular upper body clothing?: A Little Help from another person to put on and taking off regular lower body clothing?: A Lot 6 Click Score: 13    End of Session    OT Visit Diagnosis: Unsteadiness on feet (R26.81);Other abnormalities of gait and mobility (R26.89);Other symptoms and signs involving the nervous system (R29.898);History of falling (Z91.81);Repeated falls (R29.6);Low vision, both eyes (H54.2)   Activity Tolerance Patient tolerated treatment well   Patient Left in bed;with call bell/phone within reach;with bed alarm set   Nurse Communication Mobility status        Time: 1552-1610 OT Time Calculation (min): 18 min  Charges: OT General Charges $OT Visit: 1 Visit OT Treatments $Self Care/Home Management : 8-22 mins  Waldron Session, OTR/L Acute Care Rehab Services  Office [phone] Pager: [phone]    Kelli Churn 02/24/2021, 4:30 PM

## 2021-02-24 NOTE — Progress Notes (Signed)
PROGRESS NOTE    [name] [name]  WUJ:[id number] DOB: Jun 24, 1961 DOA: 02/08/2021 PCP: Pcp, No   Brief Narrative: 60 year old with no significant past medical history who presents complaining of nausea vomiting and vision changes and weakness.  She reports intermittent nausea and vomiting for the last 3 months.  Reports intermittent blurry vision.  She denies headache.  She reports worsening numbness and tingling in her hands and feet.  She feels weak all over.  Evaluation in the ED: Patient was found to have a potassium of 2.4.  UA consistent with UTI.  Patient was admitted with severe hypokalemia and UTI.  She was treated with antibiotics.  Electrolytes were repleted.  She was also found to have anemia and thrombocytopenia, for which she underwent bone marrow biopsy with nonspecific findings.  Additional studies are pending including FISH for MDS and cytogenetics.  She will need to follow-up with oncology as an outpatient.  Initially her overall weakness improved, subsequently she reported worsening of lower extremity weakness.  She underwent MRI which was negative for stroke, ventriculomegaly and chronic microvascular ischemia.  Neurology was consulted.  She is underwent MRI of cervical and thoracic spine unrevealing.  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  Patient underwent lumbar puncture on 02/16/2021.  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   Assessment & Plan:   Active Problems:   Hypokalemia   Lower urinary tract infectious disease   Thrombocytopenia (HCC)   Dietary folate deficiency anemia   Anemia   Weakness of both lower extremities   Weakness of both upper extremities   Sinus tachycardia   Protein-calorie malnutrition, severe    Estimated body mass index is 17.58 kg/m as calculated from the following:   Height as of this encounter: _0  (1.549 m).    Weight as of this encounter: 42.2 kg.  1-Extremities weakness:  Lower extremities weakness worse than upper extremities weakness but it is equal and bilateral.   -Numbness and tingling stable.  -Started N56 and folic acid.  -MRI negative for stroke.  -MRI cervical and thoracic unrevealing.  -Underwent LP 6/21. 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  Oligoclonal bands present in CSF.  Negative CSF IgG.  Gram stain negative, normal CSF protein.  Vitamin E and vitamin B6 normal. Labs Pending; Anti- GM1, MMA, Homocysteine 26, Thiamine level, copper, vitamin B1   Patient was started on IVIG for 5 days which she completed on 02/20/2021.  She is cleared from neurology for discharge.  They finally opined that her weakness is likely due to nutritional deficiency.  Patient is getting nutrition replacement which includes multivitamin, thiamine and B12 . she has improved overall.  Awaiting SNF placement arrangements by TOC.  2-Anemia; Thrombocytopenia;  Seen by oncology.  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  Patient received 2 units of platelets transfusion before LP on 02/16/2021.  Patient's platelets have been within normal range since 02/19/2021.  3-Epigastric pain;  CT abdomen ; possible gastritis, lipase negative Might need GI follow up. Endoscopy/. Patient decline inpatient evaluation.  C diff negative.  Continue PPI and Carafate.  Abdominal pain resolved.  5-UTI; urine culture grew E. coli.  Completed 5 days ceftriaxone.   6-Mild transaminases; CT hepatic steatosis. Hepatomegaly.  Labs are stable.  7-HTN; diastolic elevated intermittently.  Increase metoprolol to 100  mg twice daily and continue amlodipine 5 mg.  8-Blurry vision, decrease vision right Eye;  Ophthalmology consulted.  She was seen by Dr. Alanda Slim on 02/12/2021.  Patient diagnose with dry eye, glaucoma.  He did not rule out the possibility of vitamin deficiency causing arthropathy as well.  Recommended follow up out patient in 1 month.  9-sinus tachycardia: Mostly remains under control with intermittent elevation without symptoms.  Likely due to anxiety bursts.  Increase metoprolol to 100 mg twice daily.  10.  Rectal bleeding/internal hemorrhoids: Patient had intermittent rectal bleeding for the past few days.  Hemoglobin has remained stable.  Patient tells me that she has a history of internal hemorrhoids and this is not uncommon for her.  At this point in time, she does not want [place] to do anything for that.  11. Hypokalemia, Hyponatremia, Hypocalcemia: All are within normal range.  Continue to replace daily and check daily.   DVT prophylaxis: SCD Code Status: Partial Code; no intubation  Family Communication: Care discussed with patient.  Disposition Plan:  Status is: Inpatient  Remains inpatient appropriate because:IV treatments appropriate due to intensity of illness or inability to take PO  Dispo: The patient is from: home              Anticipated d/c is to: SNF              Patient currently is  medically stable to d/c.   Difficult to place patient No        Consultants:  Neurology  Procedures:  LP  Antimicrobials:    Subjective: Seen and examined.  No new complaints.  Remains frustrated for prolonged hospitalization.  Objective: Vitals:   02/23/21 1200 02/23/21 1653 02/23/21 2000 02/24/21 0400  BP: 114/86 104/78 (!) 134/99 117/78  Pulse: 88 99 (!) 104 94  Resp:  _0 Temp:  97.7 F (36.5 C) 98.3 F (36.8 C) 98.5 F (36.9 C)  TempSrc:  Oral Oral Oral  SpO2: 95%  96% 98%  Weight:    42.2 kg  Height:        Intake/Output Summary (Last 24 hours) at  02/24/2021 1138 Last data filed at 02/23/2021 1300 Gross per 24 hour  Intake 240 ml  Output --  Net 240 ml    Filed Weights   02/21/21 0500 02/22/21 0500 02/24/21 0400  Weight: 43.5 kg 43.4 kg 42.2 kg    Examination: General exam: Appears calm and comfortable  Respiratory system: Clear to auscultation. Respiratory effort normal. Cardiovascular system: S1 & S2 heard, RRR. No JVD, murmurs, rubs, gallops or clicks. No pedal edema. Gastrointestinal system: Abdomen is nondistended, soft and nontender. No organomegaly or masses felt. Normal bowel sounds heard. Central nervous system: Alert and oriented. No focal neurological deficits.  Global generalized weakness in all 4 extremities. Skin: No rashes, lesions or ulcers.  Psychiatry: Judgement and insight appear normal. Mood & affect appropriate.    Data Reviewed: I have personally reviewed following labs and imaging studies  CBC: Recent Labs  Lab 02/19/21 0325 02/20/21 0418 02/21/21 0334 02/22/21 0326 02/23/21 0318 02/24/21 0348  WBC 7.5 7.1 6.2 6.4 5.3 6.5  NEUTROABS 5.5 4.9 3.8  --  2.9 4.1  HGB 9.3* 9.2* 8.9* 8.8* 8.7* 8.5*  HCT 28.1* 28.7* 27.3* 26.4* 26.1* 26.7*  MCV 106.4* 106.7* 105.4* 104.3*  104.0* 107.7*  PLT 209 268 279 350 349 947    Basic Metabolic Panel: Recent Labs  Lab 02/18/21 0404 02/19/21 0325 02/20/21 0418 02/21/21 0334 02/22/21 0326 02/23/21 0318 02/24/21 0348  NA 136 136 132* 132*  --   --   --   K 4.4 4.2 4.1 4.1  --   --   --   CL 107 106 103 102  --   --   --   CO2 _0 --   --   --   GLUCOSE 106* 109* 100* 99  --   --   --   BUN 7 <5* 8 7  --   --   --   CREATININE 0.51 0.36* 0.56 0.52  --   --   --   CALCIUM 8.8* 8.9 8.9 8.8*  --   --   --   MG 2.0 1.9 2.0 2.0 2.1 2.0 1.9    GFR: Estimated Creatinine Clearance: 50.4 mL/min (by C-G formula based on SCr of 0.52 mg/dL). Liver Function Tests: Recent Labs  Lab 02/18/21 0404 02/19/21 0325 02/20/21 0418 02/21/21 0334  AST 46*  42* 45* 43*  ALT _1 ALKPHOS 70 70 70 68  BILITOT 0.7 0.9 0.8 1.1  PROT 7.1 7.8 8.2* 8.2*  ALBUMIN 3.0* 3.1* 2.8* 2.8*    No results for input(s): LIPASE, AMYLASE in the last 168 hours.  No results for input(s): AMMONIA in the last 168 hours. Coagulation Profile: No results for input(s): INR, PROTIME in the last 168 hours. Cardiac Enzymes: No results for input(s): CKTOTAL, CKMB, CKMBINDEX, TROPONINI in the last 168 hours. BNP (last 3 results) No results for input(s): PROBNP in the last 8760 hours. HbA1C: No results for input(s): HGBA1C in the last 72 hours. CBG: No results for input(s): GLUCAP in the last 168 hours. Lipid Profile: No results for input(s): CHOL, HDL, LDLCALC, TRIG, CHOLHDL, LDLDIRECT in the last 72 hours. Thyroid Function Tests: No results for input(s): TSH, T4TOTAL, FREET4, T3FREE, THYROIDAB in the last 72 hours.  Anemia Panel: No results for input(s): VITAMINB12, FOLATE, FERRITIN, TIBC, IRON, RETICCTPCT in the last 72 hours.  Sepsis Labs: No results for input(s): PROCALCITON, LATICACIDVEN in the last 168 hours.  Recent Results (from the past 240 hour(s))  Gram stain     Status: None   Collection Time: 02/15/21  5:54 PM   Specimen: PATH Cytology CSF; Cerebrospinal Fluid  Result Value Ref Range Status   Specimen Description CSF  Final   Special Requests NONE  Final   Gram Stain   Final    NO ORGANISMS SEEN NO WBC SEEN RESULT CALLED TO, READ BACK BY AND VERIFIED WITH: RN [facility] AT 2011 02/15/21 [place] A Performed at [facility], [place] [address], [place], [place] [address]    Report Status 02/15/2021 FINAL  Final          Radiology Studies: No results found.      Scheduled Meds:  amLODipine  5 mg Oral Daily   calcium carbonate  1 tablet Oral BID WC   feeding supplement  237 mL Oral TID BM   folic acid  1 mg Oral Daily   magnesium oxide  200 mg Oral BID   metoprolol tartrate  100 mg Oral BID   multivitamin  with minerals  1 tablet Oral Daily   pantoprazole  40 mg Oral BID   polyethylene glycol  17 g Oral Daily  potassium chloride  40 mEq Oral Daily   sucralfate  1 g Oral TID WC & HS   thiamine  100 mg Oral Daily   vitamin B-12  2,000 mcg Oral Daily   Continuous Infusions:    LOS: 16 days    Time spent: 25 minutes.   Darliss Cheney, MD [facility]   If 7PM-7AM, please contact night-coverage [email or web address]  02/24/2021, 11:38 AM

## 2021-02-25 LAB — BASIC METABOLIC PANEL
Anion gap: 9 (ref 5–15)
BUN: 10 mg/dL (ref 6–20)
CO2: 24 mmol/L (ref 22–32)
Calcium: 9 mg/dL (ref 8.9–10.3)
Chloride: 99 mmol/L (ref 98–111)
Creatinine, Ser: 0.56 mg/dL (ref 0.44–1.00)
GFR, Estimated: >60 mL/min (ref >60)
Glucose, Bld: 148 mg/dL — ABNORMAL HIGH (ref 70–99)
Potassium: 4 mmol/L (ref 3.5–5.1)
Sodium: 132 mmol/L — ABNORMAL LOW (ref 135–145)

## 2021-02-25 LAB — SARS CORONAVIRUS 2 (TAT 6-24 HRS): SARS Coronavirus 2: NEGATIVE

## 2021-02-25 LAB — VITAMIN B1: Vitamin B1 (Thiamine): 19 nmol/L — ABNORMAL LOW (ref 66.5–200.0)

## 2021-02-25 NOTE — Plan of Care (Signed)
  Problem: Clinical Measurements: Goal: Cardiovascular complication will be avoided Outcome: Progressing  Pt was given ativan, HR deceased and pt more relaxed.

## 2021-02-25 NOTE — Progress Notes (Addendum)
PROGRESS NOTE    [name] [name]  VZC:[id number] DOB: [birth year] DOA: 02/08/2021 PCP: Pcp, No   Brief Narrative: 60 year old with no significant past medical history who presents complaining of nausea vomiting and vision changes and weakness.  She reports intermittent nausea and vomiting for the last 3 months.  Reports intermittent blurry vision.  She denies headache.  She reports worsening numbness and tingling in her hands and feet.  She feels weak all over.  Evaluation in the ED: Patient was found to have a potassium of 2.4.  UA consistent with UTI.  Patient was admitted with severe hypokalemia and UTI.  She was treated with antibiotics.  Electrolytes were repleted.  She was also found to have anemia and thrombocytopenia, for which she underwent bone marrow biopsy with nonspecific findings.  Additional studies are pending including FISH for MDS and cytogenetics.  She will need to follow-up with oncology as an outpatient.  Initially her overall weakness improved, subsequently she reported worsening of lower extremity weakness.  She underwent MRI which was negative for stroke, ventriculomegaly and chronic microvascular ischemia.  Neurology was consulted.  She is underwent MRI of cervical and thoracic spine unrevealing.  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  Patient underwent lumbar puncture on 02/16/2021.  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   Assessment & Plan:   Active Problems:   Hypokalemia   Lower urinary tract infectious disease   Thrombocytopenia (HCC)   Dietary folate deficiency anemia   Anemia   Weakness of both lower extremities   Weakness of both upper extremities   Sinus tachycardia   Protein-calorie malnutrition, severe    Estimated body mass index is 18.14 kg/m as calculated from the following:   Height as of this encounter: _0  (1.549 m).    Weight as of this encounter: 43.5 kg.  1-Extremities weakness:  Lower extremities weakness worse than upper extremities weakness but it is equal and bilateral.   -Numbness and tingling stable.  -Started J28 and folic acid.  -MRI negative for stroke.  -MRI cervical and thoracic unrevealing.  -Underwent LP 6/21. 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  Oligoclonal bands present in CSF.  Negative CSF IgG.  Gram stain negative, normal CSF protein.  Vitamin E and vitamin B6 normal. Labs Pending; Anti- GM1, MMA, Homocysteine 26, Thiamine level, copper, vitamin B1   Patient was started on IVIG for 5 days which she completed on 02/20/2021.  She is cleared from neurology for discharge.  They finally opined that her weakness is likely due to nutritional deficiency.  Patient is getting nutrition replacement which includes multivitamin, thiamine and B12 . she has improved overall.  Awaiting SNF placement arrangements by TOC.  2-Anemia; Thrombocytopenia;  Seen by oncology.  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  Patient received 2 units of platelets transfusion before LP on 02/16/2021.  Patient's platelets have been within normal range since 02/19/2021.  3-Epigastric pain;  CT abdomen ; possible gastritis, lipase negative Might need GI follow up. Endoscopy/. Patient decline inpatient evaluation.  C diff negative.  Continue PPI and Carafate.  Abdominal pain resolved.  5-UTI; urine culture grew E. coli.  Completed 5 days ceftriaxone.   6-Mild transaminases; CT hepatic steatosis. Hepatomegaly.  Labs are stable.  7-HTN; diastolic elevated intermittently.  Increase metoprolol to 100  mg twice daily and continue amlodipine 5 mg.  8-Blurry vision, decrease vision right Eye;  Ophthalmology consulted.  She was seen by Dr. Alanda Slim on 02/12/2021.  Patient diagnose with dry eye, glaucoma.  He did not rule out the possibility of vitamin deficiency causing arthropathy as well.  Recommended follow up out patient in 1 month.  9-sinus tachycardia: Still with intermittent sinus tachycardia, mostly anxiety driven.  Improved with Ativan.  I anticipate that this will improve once she goes out of the hospital as she remains frustrated by being here.  Blood pressure on the low normal side, no room for any other rate control medications.  Continue metoprolol to 100 mg twice daily.  10.  Rectal bleeding/internal hemorrhoids: Patient had intermittent rectal bleeding for the past few days.  Hemoglobin has remained stable.  Patient tells me that she has a history of internal hemorrhoids and this is not uncommon for her.  At this point in time, she does not want [place] to do anything for that.  11. Hypokalemia, Hyponatremia, Hypocalcemia: All normal.  12-fall: This morning patient had assisted fall with the nurses.  No injury noted.  Patient has no complaints.  DVT prophylaxis: SCD Code Status: Partial Code; no intubation  Family Communication: Care discussed with patient.  Long discussion with the sister and answered several questions. Disposition Plan:  Status is: Inpatient  Remains inpatient appropriate because:IV treatments appropriate due to intensity of illness or inability to take PO  Dispo: The patient is from: home              Anticipated d/c is to: SNF              Patient currently is  medically stable to d/c.   Difficult to place patient No        Consultants:  Neurology  Procedures:  LP  Antimicrobials:    Subjective: Seen and examined.  No complaints.  Frustrated as usual due to prolonged hospitalization.  Her heart rate was around 140 and she was in sinus tachycardia  when I saw her.  Apparently, she looked anxious as well.  She was given Ativan per my instructions and her heart rate improved to 110 and she looked much calm.  Objective: Vitals:   02/25/21 0648 02/25/21 0903 02/25/21 1009 02/25/21 1116  BP: (!) 117/93 110/85 108/85 108/85  Pulse: (!) 115 (!) 129 (!) 140 (!) 121  Resp: _0 Temp: 98.7 F (37.1 C) 97.7 F (36.5 C)  98.1 F (36.7 C)  TempSrc: Oral Axillary  Oral  SpO2: 96% 98%  100%  Weight: 43.5 kg     Height:        Intake/Output Summary (Last 24 hours) at 02/25/2021 1410 Last data filed at 02/25/2021 0850 Gross per 24 hour  Intake 180 ml  Output --  Net 180 ml    Filed Weights   02/22/21 0500 02/24/21 0400 02/25/21 0648  Weight: 43.4 kg 42.2 kg 43.5 kg    Examination: General exam: Appears calm and comfortable  Respiratory system: Clear to auscultation. Respiratory effort normal. Cardiovascular system: S1 & S2 heard, sinus tachycardia. No JVD, murmurs, rubs, gallops or clicks. No pedal edema. Gastrointestinal system: Abdomen is nondistended, soft and nontender. No organomegaly or masses felt. Normal bowel sounds heard. Central nervous system: Alert and oriented. No  focal neurological deficits.  Global weakness which is equal in all 4 extremities Extremities: Symmetric 5 x 5 power. Skin: No rashes, lesions or ulcers.  Psychiatry: Judgement and insight appear normal. Mood & affect anxious   Data Reviewed: I have personally reviewed following labs and imaging studies  CBC: Recent Labs  Lab 02/19/21 0325 02/20/21 0418 02/21/21 0334 02/22/21 0326 02/23/21 0318 02/24/21 0348  WBC 7.5 7.1 6.2 6.4 5.3 6.5  NEUTROABS 5.5 4.9 3.8  --  2.9 4.1  HGB 9.3* 9.2* 8.9* 8.8* 8.7* 8.5*  HCT 28.1* 28.7* 27.3* 26.4* 26.1* 26.7*  MCV 106.4* 106.7* 105.4* 104.3* 104.0* 107.7*  PLT 209 268 279 350 349 017    Basic Metabolic Panel: Recent Labs  Lab 02/19/21 0325 02/20/21 0418 02/21/21 0334 02/22/21 0326 02/23/21 0318  02/24/21 0348 02/25/21 0933  NA 136 132* 132*  --   --   --  132*  K 4.2 4.1 4.1  --   --   --  4.0  CL 106 103 102  --   --   --  99  CO2 _0 --   --   --  24  GLUCOSE 109* 100* 99  --   --   --  148*  BUN <5* 8 7  --   --   --  10  CREATININE 0.36* 0.56 0.52  --   --   --  0.56  CALCIUM 8.9 8.9 8.8*  --   --   --  9.0  MG 1.9 2.0 2.0 2.1 2.0 1.9  --     GFR: Estimated Creatinine Clearance: 52 mL/min (by C-G formula based on SCr of 0.56 mg/dL). Liver Function Tests: Recent Labs  Lab 02/19/21 0325 02/20/21 0418 02/21/21 0334  AST 42* 45* 43*  ALT _1 ALKPHOS 70 70 68  BILITOT 0.9 0.8 1.1  PROT 7.8 8.2* 8.2*  ALBUMIN 3.1* 2.8* 2.8*    No results for input(s): LIPASE, AMYLASE in the last 168 hours.  No results for input(s): AMMONIA in the last 168 hours. Coagulation Profile: No results for input(s): INR, PROTIME in the last 168 hours. Cardiac Enzymes: No results for input(s): CKTOTAL, CKMB, CKMBINDEX, TROPONINI in the last 168 hours. BNP (last 3 results) No results for input(s): PROBNP in the last 8760 hours. HbA1C: No results for input(s): HGBA1C in the last 72 hours. CBG: No results for input(s): GLUCAP in the last 168 hours. Lipid Profile: No results for input(s): CHOL, HDL, LDLCALC, TRIG, CHOLHDL, LDLDIRECT in the last 72 hours. Thyroid Function Tests: No results for input(s): TSH, T4TOTAL, FREET4, T3FREE, THYROIDAB in the last 72 hours.  Anemia Panel: No results for input(s): VITAMINB12, FOLATE, FERRITIN, TIBC, IRON, RETICCTPCT in the last 72 hours.  Sepsis Labs: No results for input(s): PROCALCITON, LATICACIDVEN in the last 168 hours.  Recent Results (from the past 240 hour(s))  Gram stain     Status: None   Collection Time: 02/15/21  5:54 PM   Specimen: PATH Cytology CSF; Cerebrospinal Fluid  Result Value Ref Range Status   Specimen Description CSF  Final   Special Requests NONE  Final   Gram Stain   Final    NO ORGANISMS SEEN NO WBC  SEEN RESULT CALLED TO, READ BACK BY AND VERIFIED WITH: RN [facility] AT 2011 02/15/21 [place] A Performed at [facility], [place] [address], [place], [place] [address]    Report Status 02/15/2021 FINAL  Final  SARS CORONAVIRUS  2 (TAT 6-24 HRS) Nasopharyngeal Nasopharyngeal Swab     Status: None   Collection Time: 02/24/21  6:50 PM   Specimen: Nasopharyngeal Swab  Result Value Ref Range Status   SARS Coronavirus 2 NEGATIVE NEGATIVE Final    Comment: (NOTE) SARS-CoV-2 target nucleic acids are NOT DETECTED.  The SARS-CoV-2 RNA is generally detectable in upper and lower respiratory specimens during the acute phase of infection. Negative results do not preclude SARS-CoV-2 infection, do not rule out co-infections with other pathogens, and should not be used as the sole basis for treatment or other patient management decisions. Negative results must be combined with clinical observations, patient history, and epidemiological information. The expected result is Negative.  Fact Sheet for Patients: SugarRoll.be  Fact Sheet for Healthcare Providers: https://www.woods-mathews.com/  This test is not yet approved or cleared by the [place] FDA and  has been authorized for detection and/or diagnosis of SARS-CoV-2 by FDA under an Emergency Use Authorization (EUA). This EUA will remain  in effect (meaning this test can be used) for the duration of the COVID-19 declaration under Se ction 564(b)(1) of the Act, 21 U.S.C. section 360bbb-3(b)(1), unless the authorization is terminated or revoked sooner.  Performed at [facility], [place] [address] [place], [place] [address]           Radiology Studies: No results found.      Scheduled Meds:  amLODipine  5 mg Oral Daily   calcium carbonate  1 tablet Oral BID WC   feeding supplement  237 mL Oral TID BM   folic acid  1 mg Oral Daily   magnesium oxide  200 mg Oral BID    metoprolol tartrate  100 mg Oral BID   multivitamin with minerals  1 tablet Oral Daily   pantoprazole  40 mg Oral BID   polyethylene glycol  17 g Oral Daily   potassium chloride  40 mEq Oral Daily   sucralfate  1 g Oral TID WC & HS   thiamine  100 mg Oral Daily   vitamin B-12  2,000 mcg Oral Daily   Continuous Infusions:    LOS: 17 days    Time spent: 27 minutes.   Darliss Cheney, MD [facility]   If 7PM-7AM, please contact night-coverage [email or web address]  02/25/2021, 2:10 PM

## 2021-02-25 NOTE — TOC Progression Note (Addendum)
Transition of Care [facility]) - Progression Note    Patient Details  Name: [name] [name] MRN: [id number] Date of Birth: [birth year]  [facility]) CM/SW Contact  Halford Chessman Phone Number: 02/25/2021, 6:02 PM  Clinical Narrative:     CSW still waiting for insurance authorization for patient to go to SNF.  CSW continuing to follow patient's progress throughout discharge planning.  CSW updated patient's sister that SNF was still waiting for insurance authorization.   Expected Discharge Plan: Skilled Nursing Facility Barriers to Discharge: SNF Pending bed offer  Expected Discharge Plan and Services Expected Discharge Plan: Skilled Nursing Facility   Discharge Planning Services: CM Consult Post Acute Care Choice: Skilled Nursing Facility Living arrangements for the past 2 months: Single Family Home Expected Discharge Date: 02/25/21                                     Social Determinants of Health (SDOH) Interventions    Readmission Risk Interventions No flowsheet data found.

## 2021-02-25 NOTE — TOC Progression Note (Addendum)
Transition of Care [facility]) - Progression Note    Patient Details  Name: [name] [name] MRN: [id number] Date of Birth: 21-Aug-1961  Transition of Care [facility]) CM/SW Contact  Darleene Cleaver, [place] Phone Number: 02/24/21 4:30pm  Clinical Narrative:     CSW received call back from [place] [phone] at [facility], they are in network with patient's insurance company.  They have started insurance authorization awaiting for approval.  They will need a new Covid within 24 hours of discharge.  She also requested copy of patient's vaccine record.  Patient has been vaccinated and boosted per what she reported to this CSW.  CSW updated patient's sister Earney Navy, [phone] per patient request.    Expected Discharge Plan: Skilled Nursing Facility Barriers to Discharge: SNF Pending bed offer  Expected Discharge Plan and Services Expected Discharge Plan: Skilled Nursing Facility   Discharge Planning Services: CM Consult Post Acute Care Choice: Skilled Nursing Facility Living arrangements for the past 2 months: Single Family Home Expected Discharge Date: 02/25/21                                     Social Determinants of Health (SDOH) Interventions    Readmission Risk Interventions No flowsheet data found.

## 2021-02-26 ENCOUNTER — Encounter (HOSPITAL_COMMUNITY): Payer: Self-pay | Admitting: Internal Medicine

## 2021-02-26 DIAGNOSIS — E43 Unspecified severe protein-calorie malnutrition: Secondary | ICD-10-CM

## 2021-02-26 NOTE — TOC Progression Note (Addendum)
Transition of Care [facility]) - Progression Note    Patient Details  Name: [name] [name] MRN: [id number] Date of Birth: 21-Dec-1960  Transition of Care [facility]) CM/SW Contact  Darleene Cleaver, [place] Phone Number: 02/26/2021, 3:06 PM  Clinical Narrative:     CSW spoke to patient's sister, Vernona Rieger she was asking for an update on insurance authorization.  CSW updated her it was still pending.  Patient's sister asked if it would help if they tried calling patient's insurance to get a status update or if it would speed up the process.  CSW informed her it does not hurt to try, but usually it does not make a difference.  Patient's sister then asked if patient could transfer to [facility] because she was told there are more specialists at the hospital.  CSW explained that patient can not be transferred unless the attending physician feels the patient needs to be transferred.    CSW also informed her that if they want to transfer patient themselves, they would have to sign patient AMA, and the hospital will not help them with getting patient to a different facility.  CSW also explained that if patient is approved by [employer], once she is at [facility] they can assist with trying to help patient get an appointment with a specialist.     CSW attempted to contact [facility] to get an update on insurance authorization.  CSW had to leave a message on voice mail.  CSW continuing to follow patient's progress throughout discharge planning.  4:30pm  CSW received phone call from [facility] at [facility] that patient can go to SNF tomorrow pending negative Covide test.  CSW updated attended physician, patient will need stretcher transport.  CSW spoke to [employer] they are working on finding ride for her.  CSW updated patient and her sister.  Expected Discharge Plan: Skilled Nursing Facility Barriers to Discharge: SNF Pending bed offer  Expected Discharge Plan and  Services Expected Discharge Plan: Skilled Nursing Facility   Discharge Planning Services: CM Consult Post Acute Care Choice: Skilled Nursing Facility Living arrangements for the past 2 months: Single Family Home Expected Discharge Date: 02/26/21                                     Social Determinants of Health (SDOH) Interventions    Readmission Risk Interventions No flowsheet data found.

## 2021-02-26 NOTE — Progress Notes (Signed)
Physical Therapy Treatment Patient Details Name: [name] [name] MRN: [id number] DOB: 28-Aug-1961 Today's Date: 02/26/2021    History of Present Illness 60 year old with no significant past medical history who presents complaining of nausea vomiting and vision changes and weakness.  She reports intermittent nausea and vomiting for the last 3 months.  Reports intermittent blurry vision. She reports worsening numbness and tingling in her hands and feet. She feels weak all over.  Pt found to be hypokalemic and with UTI. MRI revealed: 1. No acute infarct.  2. Ventriculomegaly, which may be ex vacuo in the setting of  moderate atrophy. Normal pressure hydrocephalus could have a similar  appearance in the correct clinical setting.  3. Mild to moderate T2/FLAIR hyperintensities within the white  matter. This finding is nonspecific but can be seen in the setting  of chronic microvascular ischemia, a demyelinating process such as multiple sclerosis, chronic migraines, or as the sequelae of a prior  infectious or inflammatory process. LP negative. 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 Patient able to sequence bed mobility and Min guard/assist and use of bed rail. Mod+2 assist required for transfers with bil HHA to move bed<>BSC. Pt with more upright posture sitting and completed LE resistance exercises to facilitate strength and motor control. She will continue to benefit from skilled PT interventions to progress mobility and address impairments. Recommending rehab at [facility] setting.     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 assist to facilitate anterior lean at trunk initially. Pt required assist for LE's back to bed and cues to use UE's to lower onto Lt sidelying.    Transfers Overall transfer level: Needs assistance Equipment used: 2 person hand held assist Transfers: Sit to/from [employer] Sit to Stand: +2 physical assistance;+2 safety/equipment;Mod assist Stand pivot transfers: Mod assist;+2 physical assistance;+2 safety/equipment       General transfer comment: Sit<>Stand from EOB and 2x from [facility]. Stand step/pivot transfer to move to [facility] due to need for BM. Pt remains ataxic and required blocking at bil knees to prevent buckling and assistance to guide foot placement. Min assist to scoot posteriorly on BSC and obtain upright posture.  Ambulation/Gait                 Stairs             Wheelchair Mobility    Modified Rankin (Stroke Patients Only)       Balance Overall balance assessment: Needs assistance;History of Falls Sitting-balance support: Bilateral upper extremity supported;Feet supported Sitting balance-Leahy Scale: Fair Sitting balance - Comments: pt able to maintain upright posture seated on BSC   Standing balance support: Bilateral upper extremity supported Standing balance-Leahy Scale: Zero  Cognition Arousal/Alertness: Awake/alert Behavior During Therapy: WFL for tasks assessed/performed Overall Cognitive Status: Within Functional Limits for tasks assessed                                        Exercises Other Exercises Other Exercises: Bil LE LAQ/hamstring curl with  manual resistance from therapist (5 reps)    General Comments        Pertinent Vitals/Pain Pain Assessment: Faces Faces Pain Scale: Hurts a little bit Pain Location: hands, feet (bilateral) Pain Descriptors / Indicators: Tingling Pain Intervention(s): Limited activity within patient's tolerance;Monitored during session;Repositioned    Home Living                      Prior Function            PT Goals (current goals can now be found in the care plan section) Acute Rehab PT Goals PT Goal Formulation: With patient Time For Goal Achievement: 02/24/21 Potential to Achieve Goals: Fair Progress towards PT goals: Progressing toward goals    Frequency    Min 2X/week      PT Plan Current plan remains appropriate    Co-evaluation              AM-PAC PT "6 Clicks" Mobility   Outcome Measure  Help needed turning from your back to your side while in a flat bed without using bedrails?: A Little Help needed moving from lying on your back to sitting on the side of a flat bed without using bedrails?: A Little Help needed moving to and from a bed to a chair (including a wheelchair)?: Total Help needed standing up from a chair using your arms (e.g., wheelchair or bedside chair)?: Total Help needed to walk in hospital room?: Total Help needed climbing 3-5 steps with a railing? : Total 6 Click Score: 10    End of Session Equipment Utilized During Treatment: Gait belt Activity Tolerance: Patient tolerated treatment well Patient left: in bed;with call bell/phone within reach;with bed alarm set Nurse Communication: Mobility status PT Visit Diagnosis: Ataxic gait (R26.0);History of falling (Z91.81);Unsteadiness on feet (R26.81);Muscle weakness (generalized) (M62.81);Difficulty in walking, not elsewhere classified (R26.2);Other symptoms and signs involving the nervous system (R29.898);Repeated falls (R29.6)     Time: 5701-7793 PT Time Calculation (min) (ACUTE ONLY): 38  min  Charges:  $Therapeutic Exercise: 8-22 mins $Therapeutic Activity: 23-37 mins                     Wynn Maudlin, DPT Acute Rehabilitation Services Office [phone] Pager [phone]    Anitra Lauth 02/26/2021, 12:31 PM

## 2021-02-26 NOTE — Progress Notes (Signed)
Nurse tech found pt on the edge of the bed attempting to go home. While attempting to help pt sit farther back on the bed pt attempted to stand up. [name] feet slid from under her, nurse tech guided pt slowly down to the floor. [name] left great toe nail scratched her right ankle. Skin was assessed and foam dressing placed over abrasion. Family and MD notified of assisted fall. Pt denies any pain. [name] [name]

## 2021-02-26 NOTE — Progress Notes (Signed)
PROGRESS NOTE    [name] [name]  BCW:[id number] DOB: May 11, 1961 DOA: 02/08/2021 PCP: Pcp, No   Brief Narrative: 60 year old with no significant past medical history who presents complaining of nausea vomiting and vision changes and weakness.  She reports intermittent nausea and vomiting for the last 3 months.  Reports intermittent blurry vision.  She denies headache.  She reports worsening numbness and tingling in her hands and feet.  She feels weak all over.  Evaluation in the ED: Patient was found to have a potassium of 2.4.  UA consistent with UTI.  Patient was admitted with severe hypokalemia and UTI.  She was treated with antibiotics.  Electrolytes were repleted.  She was also found to have anemia and thrombocytopenia, for which she underwent bone marrow biopsy with nonspecific findings.  Additional studies are pending including FISH for MDS and cytogenetics.  She will need to follow-up with oncology as an outpatient.  Initially her overall weakness improved, subsequently she reported worsening of lower extremity weakness.  She underwent MRI which was negative for stroke, ventriculomegaly and chronic microvascular ischemia.  Neurology was consulted.  She is underwent MRI of cervical and thoracic spine unrevealing.  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  Patient underwent lumbar puncture on 02/16/2021.  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   Assessment & Plan:   Active Problems:   Hypokalemia   Lower urinary tract infectious disease   Thrombocytopenia (HCC)   Dietary folate deficiency anemia   Anemia   Weakness of both lower extremities   Weakness of both upper extremities   Sinus tachycardia   Protein-calorie malnutrition, severe    Estimated body mass index is 18.41 kg/m as calculated from the following:   Height as of this encounter: 5' 1"  (1.549 m).    Weight as of this encounter: 44.2 kg.  1-Extremities weakness:  Lower extremities weakness worse than upper extremities weakness but it is equal and bilateral.   -Numbness and tingling stable.  -Started W38 and folic acid.  -MRI negative for stroke.  -MRI cervical and thoracic unrevealing.  -Underwent LP 6/21. 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  Oligoclonal bands present in CSF.  Negative CSF IgG.  Gram stain negative, normal CSF protein.  Vitamin E and vitamin B6 normal. Labs Pending; Anti- GM1, MMA, Homocysteine 26, Thiamine level, copper, vitamin B1   Patient was started on IVIG for 5 days which she completed on 02/20/2021.  She is cleared from neurology for discharge.  They finally opined that her weakness is likely due to nutritional deficiency.  Patient is getting nutrition replacement which includes multivitamin, thiamine and B12 . she has improved overall.  Awaiting SNF placement arrangements by TOC.  2-Anemia; Thrombocytopenia;  Seen by oncology.  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  Patient received 2 units of platelets transfusion before LP on 02/16/2021.  Patient's platelets have been within normal range since 02/19/2021.  3-Epigastric pain;  CT abdomen ; possible gastritis, lipase negative Might need GI follow up. Endoscopy/. Patient decline inpatient evaluation.  C diff negative.  Continue PPI and Carafate.  Abdominal pain resolved.  5-UTI; urine culture grew E. coli.  Completed 5 days ceftriaxone.   6-Mild transaminases; CT hepatic steatosis. Hepatomegaly.  Labs are stable.  7-HTN; diastolic elevated intermittently.  Increase metoprolol to 100  mg twice daily and continue amlodipine 5 mg.  8-Blurry vision, decrease vision right Eye;  Ophthalmology consulted.  She was seen by Dr. Alanda Slim on 02/12/2021.  Patient diagnose with dry eye, glaucoma.  He did not rule out the possibility of vitamin deficiency causing arthropathy as well.  Recommended follow up out patient in 1 month.  9-sinus tachycardia: Still with intermittent sinus tachycardia, mostly anxiety driven.  Improved with Ativan yesterday.  Continue metoprolol to 100 mg twice daily.  10.  Rectal bleeding/internal hemorrhoids: Patient had intermittent rectal bleeding for the past few days.  Hemoglobin has remained stable.  Patient tells me that she has a history of internal hemorrhoids and this is not uncommon for her.  At this point in time, she does not want [place] to do anything for that.  11. Hypokalemia, Hyponatremia, Hypocalcemia: All normal.  12-fall:  patient had assisted fall with the nurses day before yesterday.  No injury noted.  Patient has no complaints.  13: Generalized weakness/severe protein calorie malnutrition: PT OT recommends SNF.  Nutrition on board and she is getting nutritional boost.  Does not like to eat or drink much.  DVT prophylaxis: SCD Code Status: Partial Code; no intubation  Family Communication: Care discussed with patient.  Long discussion with the sister yesterday and answered several questions. Disposition Plan:  Status is: Inpatient  Remains inpatient appropriate because:IV treatments appropriate due to intensity of illness or inability to take PO  Dispo: The patient is from: home              Anticipated d/c is to: SNF              Patient currently is  medically stable to d/c.   Difficult to place patient No        Consultants:  Neurology  Procedures:  LP  Antimicrobials:    Subjective: Seen and examined.  Alert and oriented.  Continues to complain of weakness in all 4 extremities and some tingling in the bilateral  hands.  Objective: Vitals:   02/26/21 0400 02/26/21 0700 02/26/21 0942 02/26/21 1231  BP: 112/74  122/88 103/73  Pulse: 88  97 81  Resp: 14   18  Temp: 98.1 F (36.7 C)   97.9 F (36.6 C)  TempSrc: Axillary   Oral  SpO2: 100%   100%  Weight:  44.2 kg    Height:        Intake/Output Summary (Last 24 hours) at 02/26/2021 1447 Last data filed at 02/26/2021 0900 Gross per 24 hour  Intake 240 ml  Output --  Net 240 ml    Filed Weights   02/24/21 0400 02/25/21 0648 02/26/21 0700  Weight: 42.2 kg 43.5 kg 44.2 kg    Examination: General exam: Appears calm and comfortable  Respiratory system: Clear to auscultation. Respiratory effort normal. Cardiovascular system: S1 & S2 heard, RRR. No JVD, murmurs, rubs, gallops or clicks. No pedal edema. Gastrointestinal system: Abdomen is nondistended, soft and nontender. No organomegaly or masses felt. Normal bowel sounds heard. Central nervous system: Alert and oriented. No focal neurological deficits. Extremities: Symmetric 5 x 5 power. Skin: No rashes, lesions or ulcers.  Psychiatry: Judgement and insight appear poor   Data Reviewed: I have personally reviewed following labs and imaging studies  CBC: Recent Labs  Lab 02/20/21 0418 02/21/21 0334 02/22/21  3875 02/23/21 0318 02/24/21 0348  WBC 7.1 6.2 6.4 5.3 6.5  NEUTROABS 4.9 3.8  --  2.9 4.1  HGB 9.2* 8.9* 8.8* 8.7* 8.5*  HCT 28.7* 27.3* 26.4* 26.1* 26.7*  MCV 106.7* 105.4* 104.3* 104.0* 107.7*  PLT 268 279 350 349 643    Basic Metabolic Panel: Recent Labs  Lab 02/20/21 0418 02/21/21 0334 02/22/21 0326 02/23/21 0318 02/24/21 0348 02/25/21 0933  NA 132* 132*  --   --   --  132*  K 4.1 4.1  --   --   --  4.0  CL 103 102  --   --   --  99  CO2 24 26  --   --   --  24  GLUCOSE 100* 99  --   --   --  148*  BUN 8 7  --   --   --  10  CREATININE 0.56 0.52  --   --   --  0.56  CALCIUM 8.9 8.8*  --   --   --  9.0  MG 2.0 2.0 2.1 2.0 1.9  --     GFR: Estimated  Creatinine Clearance: 52.8 mL/min (by C-G formula based on SCr of 0.56 mg/dL). Liver Function Tests: Recent Labs  Lab 02/20/21 0418 02/21/21 0334  AST 45* 43*  ALT 20 19  ALKPHOS 70 68  BILITOT 0.8 1.1  PROT 8.2* 8.2*  ALBUMIN 2.8* 2.8*    No results for input(s): LIPASE, AMYLASE in the last 168 hours.  No results for input(s): AMMONIA in the last 168 hours. Coagulation Profile: No results for input(s): INR, PROTIME in the last 168 hours. Cardiac Enzymes: No results for input(s): CKTOTAL, CKMB, CKMBINDEX, TROPONINI in the last 168 hours. BNP (last 3 results) No results for input(s): PROBNP in the last 8760 hours. HbA1C: No results for input(s): HGBA1C in the last 72 hours. CBG: No results for input(s): GLUCAP in the last 168 hours. Lipid Profile: No results for input(s): CHOL, HDL, LDLCALC, TRIG, CHOLHDL, LDLDIRECT in the last 72 hours. Thyroid Function Tests: No results for input(s): TSH, T4TOTAL, FREET4, T3FREE, THYROIDAB in the last 72 hours.  Anemia Panel: No results for input(s): VITAMINB12, FOLATE, FERRITIN, TIBC, IRON, RETICCTPCT in the last 72 hours.  Sepsis Labs: No results for input(s): PROCALCITON, LATICACIDVEN in the last 168 hours.  Recent Results (from the past 240 hour(s))  SARS CORONAVIRUS 2 (TAT 6-24 HRS) Nasopharyngeal Nasopharyngeal Swab     Status: None   Collection Time: 02/24/21  6:50 PM   Specimen: Nasopharyngeal Swab  Result Value Ref Range Status   SARS Coronavirus 2 NEGATIVE NEGATIVE Final    Comment: (NOTE) SARS-CoV-2 target nucleic acids are NOT DETECTED.  The SARS-CoV-2 RNA is generally detectable in upper and lower respiratory specimens during the acute phase of infection. Negative results do not preclude SARS-CoV-2 infection, do not rule out co-infections with other pathogens, and should not be used as the sole basis for treatment or other patient management decisions. Negative results must be combined with clinical  observations, patient history, and epidemiological information. The expected result is Negative.  Fact Sheet for Patients: SugarRoll.be  Fact Sheet for Healthcare Providers: https://www.woods-mathews.com/  This test is not yet approved or cleared by the [place] FDA and  has been authorized for detection and/or diagnosis of SARS-CoV-2 by FDA under an Emergency Use Authorization (EUA). This EUA will remain  in effect (meaning this test can be used) for the duration of the COVID-19 declaration  under Se ction 564(b)(1) of the Act, 21 U.S.C. section 360bbb-3(b)(1), unless the authorization is terminated or revoked sooner.  Performed at [facility], [place] [address], [place], [place] [address]           Radiology Studies: No results found.      Scheduled Meds:  amLODipine  5 mg Oral Daily   calcium carbonate  1 tablet Oral BID WC   feeding supplement  237 mL Oral TID BM   folic acid  1 mg Oral Daily   magnesium oxide  200 mg Oral BID   metoprolol tartrate  100 mg Oral BID   multivitamin with minerals  1 tablet Oral Daily   pantoprazole  40 mg Oral BID   polyethylene glycol  17 g Oral Daily   potassium chloride  40 mEq Oral Daily   sucralfate  1 g Oral TID WC & HS   thiamine  100 mg Oral Daily   vitamin B-12  2,000 mcg Oral Daily   Continuous Infusions:    LOS: 18 days    Time spent: 25 minutes.   Darliss Cheney, MD [facility]   If 7PM-7AM, please contact night-coverage [email or web address]  02/26/2021, 2:47 PM

## 2021-02-27 DIAGNOSIS — R000 Tachycardia, unspecified: Secondary | ICD-10-CM

## 2021-02-27 DIAGNOSIS — D518 Other vitamin B12 deficiency anemias: Secondary | ICD-10-CM

## 2021-02-27 DIAGNOSIS — R29898 Other symptoms and signs involving the musculoskeletal system: Secondary | ICD-10-CM

## 2021-02-27 LAB — RESP PANEL BY RT-PCR (FLU A&B, COVID) ARPGX2
Influenza A by PCR: NEGATIVE
Influenza B by PCR: NEGATIVE
SARS Coronavirus 2 by RT PCR: NEGATIVE

## 2021-02-27 LAB — SARS CORONAVIRUS 2 (TAT 6-24 HRS): SARS Coronavirus 2: NEGATIVE

## 2021-02-27 MED ORDER — AMLODIPINE BESYLATE 5 MG PO TABS: 5.0000 mg | ORAL_TABLET | Freq: Every day | ORAL | 0 refills | Status: DC

## 2021-02-27 MED ORDER — MAGNESIUM OXIDE -MG SUPPLEMENT 400 (240 MG) MG PO TABS: 200.0000 mg | ORAL_TABLET | Freq: Two times a day (BID) | ORAL | 0 refills | Status: DC

## 2021-02-27 MED ORDER — METOPROLOL TARTRATE 100 MG PO TABS: 100.0000 mg | ORAL_TABLET | Freq: Two times a day (BID) | ORAL | 0 refills | Status: AC

## 2021-02-27 MED ORDER — SUCRALFATE 1 GM/10ML PO SUSP: 1.0000 g | Freq: Three times a day (TID) | ORAL | 0 refills | Status: DC

## 2021-02-27 MED ORDER — CALCIUM CARBONATE 1250 (500 CA) MG PO TABS: 1.0000 | ORAL_TABLET | Freq: Two times a day (BID) | ORAL | 0 refills | Status: DC

## 2021-02-27 MED ORDER — LORAZEPAM 0.5 MG PO TABS: 0.5000 mg | ORAL_TABLET | Freq: Three times a day (TID) | ORAL | 0 refills | Status: AC | PRN

## 2021-02-27 MED ORDER — ZINC OXIDE 40 % EX OINT: TOPICAL_OINTMENT | CUTANEOUS | 0 refills | Status: AC | PRN

## 2021-02-27 MED ORDER — POLYETHYLENE GLYCOL 3350 17 G PO PACK: 17.0000 g | PACK | Freq: Every day | ORAL | 0 refills | Status: DC

## 2021-02-27 MED ORDER — WITCH HAZEL-GLYCERIN EX PADS: MEDICATED_PAD | CUTANEOUS | 12 refills | Status: DC | PRN

## 2021-02-27 MED ORDER — FOLIC ACID 1 MG PO TABS: 1.0000 mg | ORAL_TABLET | Freq: Every day | ORAL | 0 refills | Status: DC

## 2021-02-27 MED ORDER — ARTIFICIAL TEARS OPHTHALMIC OINT: TOPICAL_OINTMENT | OPHTHALMIC | 0 refills | Status: AC | PRN

## 2021-02-27 MED ORDER — TRAMADOL HCL 50 MG PO TABS: 50.0000 mg | ORAL_TABLET | Freq: Four times a day (QID) | ORAL | 0 refills | Status: DC | PRN

## 2021-02-27 MED ORDER — THIAMINE HCL 100 MG PO TABS: 100.0000 mg | ORAL_TABLET | Freq: Every day | ORAL | 0 refills | Status: DC

## 2021-02-27 MED ORDER — POTASSIUM CHLORIDE 20 MEQ PO PACK: 40.0000 meq | PACK | Freq: Every day | ORAL | 0 refills | Status: DC

## 2021-02-27 MED ORDER — CYANOCOBALAMIN 2000 MCG PO TABS: 2000.0000 ug | ORAL_TABLET | Freq: Every day | ORAL | 0 refills | Status: AC

## 2021-02-27 MED ORDER — PANTOPRAZOLE SODIUM 40 MG PO TBEC: 40.0000 mg | DELAYED_RELEASE_TABLET | Freq: Two times a day (BID) | ORAL | 0 refills | Status: AC

## 2021-02-27 MED ORDER — ENSURE ENLIVE PO LIQD: 237.0000 mL | Freq: Three times a day (TID) | ORAL | 12 refills | Status: AC

## 2021-02-27 MED ORDER — ADULT MULTIVITAMIN W/MINERALS CH: 1.0000 | ORAL_TABLET | Freq: Every day | ORAL | 0 refills | Status: AC

## 2021-02-27 MED ORDER — COVID-19 MRNA VAC-TRIS(PFIZER) 30 MCG/0.3ML IM SUSP
0.3000 mL | Freq: Once | INTRAMUSCULAR | Status: AC
  Administered 2021-02-27: 0.3 mL via INTRAMUSCULAR
  Filled 2021-02-27: qty 0.3

## 2021-02-27 NOTE — TOC Transition Note (Signed)
Transition of Care [facility]) - CM/SW Discharge Note   Patient Details  Name: [name] [name] MRN: [id number] Date of Birth: 1960-09-24  Transition of [facility]) CM/SW Contact:  Darleene Cleaver, LCSW Phone Number: 02/27/2021, 1:56 PM   Clinical Narrative:     CSW spoke to [place] at [facility], [phone] and updated her that patient has received her Covid Booster and also has tested negative for Covid.  CSW updated patient's sister Vernona Rieger [phone] that she is discharging today.  Patient to be d/c'ed today to [facility].  Patient and family agreeable to plans will transport via [employer] to call report [phone].     Final next level of care: Skilled Nursing Facility Barriers to Discharge: Barriers Resolved   Patient Goals and CMS Choice Patient states their goals for this hospitalization and ongoing recovery are:: Patient plans to go to staff for rehab, then return back home. CMS Medicare.gov Compare Post Acute Care list provided to:: Patient Choice offered to / list presented to : Patient, Sibling  Discharge Placement   Existing PASRR number confirmed : 02/24/21          Patient chooses bed at: Other - please specify in the comment section below: [facility] [address] [place], [place], [address]) Patient to be transferred to facility by: [employer] administrator. Name of family member notified: Sister Earney Navy Patient and family notified of of transfer: 02/27/21  Discharge Plan and Services   Discharge Planning Services: CM Consult Post Acute Care Choice: Skilled Nursing Facility                               Social Determinants of Health (SDOH) Interventions     Readmission Risk Interventions No flowsheet data found.

## 2021-02-27 NOTE — Progress Notes (Signed)
Patient D/C'd to [facility] via transportation service. Report was attempted to be called x2 prior to D/C. Direct number to Rozell Searing was provided by facility [phone]. This RN called x2 and left a VM with my name and phone number. All pt belongings sent with pt. Pe rpt, she spoke with her sister as she was being D/C'd, and sister is aware of D/C.

## 2021-02-27 NOTE — Plan of Care (Signed)
  Problem: Pain Managment: Goal: General experience of comfort will improve Outcome: Completed/Met

## 2021-02-27 NOTE — Discharge Summary (Signed)
Physician Discharge Summary  [name] [name]:[id number] DOB: 1961-02-27 DOA: 02/08/2021  PCP: Pcp, No  Admit date: 02/08/2021 Discharge date: 02/27/2021  Recommendations for Outpatient Follow-up:  Follow upw with PCP in 7-10 days. Have CBC and chemistry checked at that visit. Follow up with neurology as directed.   Follow-up Information     [facility], [facility] Follow up.   Specialty: Home Health Services Why: Will follow you at home for physical therapy. Contact information: [address]  [place] [address] [phone]         [facility] Follow up.   Specialty: Family Medicine Why: Please call for an appointment Contact information: [place] [id number] [phone]                 Discharge Diagnoses: Principal diagnosis is #1 Weakness of the extremities Anemia Thrombocytopenia Epigastric pain/gastritis UTI Transaminasitis Hypertension Glaucoma Dry Eye Sinus tachycardia REctal bleeding due to internal hemorrhoids Hypokalemia Hyponatremia Hypomcalcemia Assisted fall, no injuries Generalized weakness Severe protein calorie malnutrition  Discharge Condition: Fair Disposition: [facility] and rehab  Diet recommendation: Regular with Ensure Enlive/Ensure Plus supplement tid between meals.  Filed Weights   02/25/21 0648 02/26/21 0700 02/27/21 0434  Weight: 43.5 kg 44.2 kg 41.9 kg    History of present illness:  [name] [name] is a 60 y.o. female with no past medical history. Presenting with N/V, vision changes, and weakness. She reports that she's been dealing with N/V intermittently for the last 2 months. She hasn't seen a doctor about her symptoms. She hasn't tried any medicines. Her symptoms were not associated with any events that she can recall. Over the last several days, her symptoms have worsened to include vomiting up everything. She tried gatorade, but  that didn't help. She notes that over the last several days she's had increased intermittent visual blurriness. She has no associated HA or eye pain. She has also noted worsening tingling and numbness in her hands and feet. This started 2 weeks ago and has worsened over the last several days. She believes it has resulted in an overall general weakness that has resulted in her having multiple falls this morning. She denies any LOC or head injury during these falls. She became concerned and called for EMS to bring her to the ED. She denies any other aggravating or alleviating factors.    ED Course: She was found to have a K+ of 2.4. She was found to have a UTI. CTH was negative. She was given K+ and rocephin. TRH was called for admission.   Hospital Course:   Patient was admitted with severe hypokalemia and UTI.  She was treated with antibiotics.  Electrolytes were repleted.  She was also found to have anemia and thrombocytopenia, for which she underwent bone marrow biopsy with nonspecific findings.  Additional studies are pending including FISH for MDS and cytogenetics.  She will need to follow-up with oncology as an outpatient.   Initially her overall weakness improved, subsequently she reported worsening of lower extremity weakness.  She underwent MRI which was negative for stroke, ventriculomegaly and chronic microvascular ischemia.  Neurology was consulted.  She is underwent MRI of cervical and thoracic spine unrevealing.  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  Patient underwent lumbar puncture on 02/16/2021.  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  The patient has completed her course  of IVIG. She has been evaluated by PT/OT. The recommendation is for her to go to rehab. She has been accespted at [facility]. She will discharge to there today.  Today's assessment: S: Pt  is awake, alert, and oriented x 3. No acute distress. O: Vitals:  Vitals:   02/26/21 2126 02/27/21 0405  BP: (!) 131/92 (!) 117/94  Pulse: (!) 101 (!) 105  Resp: 18   Temp: 98.5 F (36.9 C) 98 F (36.7 C)  SpO2: 100% 98%    Constitutional:  Awake, alert, and oriented x 3. Neck:  neck appears normal, no masses, normal ROM, supple no thyromegaly Respiratory:  CTA bilaterally, no w/r/r.  Respiratory effort normal. No retractions or accessory muscle use Cardiovascular:  RRR, no m/r/g No LE extremity edema   Normal pedal pulses Abdomen:  Abdomen appears normal; no tenderness or masses No hernias No HSM Musculoskeletal:  Digits/nails: no clubbing, cyanosis, petechiae, infection exam of joints, bones, muscles of at least one of following: head/neck, RUE, LUE, RLE, LLE   strength and tone normal, no atrophy, no abnormal movements No tenderness, masses Normal ROM, no contractures  gait and station Skin:  No rashes, lesions, ulcers palpation of skin: no induration or nodules Neurologic:  CN 2-12 intact Sensation all 4 extremities intact Psychiatric:  judgement and insight appear poor. Discharge Instructions  Discharge Instructions     Activity as tolerated - No restrictions   Complete by: As directed    Call MD for:  persistant nausea and vomiting   Complete by: As directed    Call MD for:  severe uncontrolled pain   Complete by: As directed    Call MD for:  temperature >100.4   Complete by: As directed    Diet - low sodium heart healthy   Complete by: As directed    Discharge instructions   Complete by: As directed    Follow up with neurology as directed. Follow up with PCP in 7-10 days. Have chemistry and CBC drawn on that visit and reported to PCP.   Increase activity slowly   Complete by: As directed    No wound care   Complete by: As directed       Allergies as of 02/27/2021   No Known Allergies      Medication List     TAKE these medications     amLODipine 5 MG tablet Commonly known as: NORVASC Take 1 tablet (5 mg total) by mouth daily. Start taking on: February 28, 2021   artificial tears Oint ophthalmic ointment Commonly known as: LACRILUBE Place into both eyes every 4 (four) hours as needed for dry eyes.   calcium carbonate 1250 (500 Ca) MG tablet Commonly known as: OS-CAL - dosed in mg of elemental calcium Take 1 tablet (500 mg of elemental calcium total) by mouth 2 (two) times daily with a meal.   cyanocobalamin 2000 MCG tablet Take 1 tablet (2,000 mcg total) by mouth daily. Start taking on: February 28, 2021   feeding supplement Liqd Take 237 mLs by mouth 3 (three) times daily between meals.   folic acid 1 MG tablet Commonly known as: FOLVITE Take 1 tablet (1 mg total) by mouth daily. Start taking on: February 28, 2021   liver oil-zinc oxide 40 % ointment Commonly known as: DESITIN Apply topically as needed for irritation.   LORazepam 0.5 MG tablet Commonly known as: ATIVAN Take 1 tablet (0.5 mg total) by mouth every 8 (eight) hours as needed for  anxiety (nausea).   magnesium oxide 400 (240 Mg) MG tablet Commonly known as: MAG-OX Take 0.5 tablets (200 mg total) by mouth 2 (two) times daily.   metoprolol tartrate 100 MG tablet Commonly known as: LOPRESSOR Take 1 tablet (100 mg total) by mouth 2 (two) times daily.   multivitamin with minerals Tabs tablet Take 1 tablet by mouth daily. Start taking on: February 28, 2021   naproxen sodium 220 MG tablet Commonly known as: ALEVE Take 220 mg by mouth daily as needed (pain).   pantoprazole 40 MG tablet Commonly known as: PROTONIX Take 1 tablet (40 mg total) by mouth 2 (two) times daily.   polyethylene glycol 17 g packet Commonly known as: MIRALAX / GLYCOLAX Take 17 g by mouth daily. Start taking on: February 28, 2021   potassium chloride 20 MEQ packet Commonly known as: KLOR-CON Take 40 mEq by mouth daily. Start taking on: February 28, 2021   sucralfate 1 GM/10ML  suspension Commonly known as: CARAFATE Take 10 mLs (1 g total) by mouth 4 (four) times daily -  with meals and at bedtime.   thiamine 100 MG tablet Take 1 tablet (100 mg total) by mouth daily. Start taking on: February 28, 2021   traMADol 50 MG tablet Commonly known as: ULTRAM Take 1 tablet (50 mg total) by mouth every 6 (six) hours as needed for severe pain.   witch hazel-glycerin pad Commonly known as: TUCKS Apply topically as needed for hemorrhoids.       No Known Allergies  The results of significant diagnostics from this hospitalization (including imaging, microbiology, ancillary and laboratory) are listed below for reference.    Significant Diagnostic Studies: CT HEAD WO CONTRAST  Result Date: 02/08/2021 CLINICAL DATA:  Acute neuro deficit.  Vision change.  Unsteady gait EXAM: CT HEAD WITHOUT CONTRAST TECHNIQUE: Contiguous axial images were obtained from the base of the skull through the vertex without intravenous contrast. COMPARISON:  None. FINDINGS: Brain: Generalized atrophy. Periventricular white matter hypodensity bilaterally appears symmetric. Negative for acute infarct, hemorrhage, mass. 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 Electronically Signed   By: Franchot Gallo M.D.   On: 02/08/2021 13:34   MR BRAIN WO CONTRAST  Result Date: 02/08/2021 CLINICAL DATA:  Transient ischemic attack. EXAM: MRI HEAD WITHOUT CONTRAST TECHNIQUE: Multiplanar, multiecho pulse sequences of the brain and surrounding structures were obtained without intravenous contrast. COMPARISON:  Same day CT head. FINDINGS: Brain: No acute infarction, hemorrhage, hydrocephalus, extra-axial collection or mass lesion. Moderate atrophy with ventriculomegaly. Cerebellar tonsils are in normal position. Unremarkable sella. Vascular: Major arterial flow voids are  maintained at the skull base. Skull and upper cervical spine: Normal marrow signal. Sinuses/Orbits: Mild ethmoid air cell mucosal thickening. No air-fluid levels. Unremarkable orbits. Other: No sizable mastoid effusions. IMPRESSION: 1. No acute infarct. 2. Ventriculomegaly, which may be ex vacuo in the setting of moderate atrophy. Normal pressure hydrocephalus could have a similar appearance in the correct clinical setting. 3. Mild to moderate T2/FLAIR hyperintensities within the white matter. This finding is nonspecific but can be seen in the setting of chronic microvascular ischemia, a demyelinating process such as multiple sclerosis, chronic migraines, or as the sequelae of a prior infectious or inflammatory process. Electronically Signed   By: Margaretha Sheffield MD   On: 02/08/2021 17:34   MR CERVICAL SPINE W WO CONTRAST  Result Date: 02/13/2021 CLINICAL DATA:  Myelopathy EXAM: MRI  CERVICAL SPINE WITHOUT AND WITH CONTRAST TECHNIQUE: Multiplanar and multiecho pulse sequences of the cervical spine, to include the craniocervical junction and cervicothoracic junction, were obtained without and with intravenous contrast. CONTRAST:  58m GADAVIST GADOBUTROL 1 MMOL/ML IV SOLN COMPARISON:  None. FINDINGS: Alignment: Grade 1 retrolisthesis at C5-6 Vertebrae: No fracture, evidence of discitis, or bone lesion. Cord: Normal signal and morphology. No abnormal contrast enhancement. Posterior Fossa, vertebral arteries, paraspinal tissues: Negative. Disc levels: C1-C2: Normal. C2-C3: Normal disc space and facets. No spinal canal or neuroforaminal stenosis. C3-C4: Moderate right facet hypertrophy. No spinal canal or neural foraminal stenosis. C4-C5: Normal disc space and facets. No spinal canal or neuroforaminal stenosis. C5-C6: Small disc bulge with bilateral uncovertebral osteophytes. Moderate right foraminal stenosis. No spinal canal stenosis. C6-C7: Small central disc protrusion without spinal canal or neural foraminal  stenosis. C7-T1: Normal disc space and facets. No spinal canal or neuroforaminal stenosis. IMPRESSION: 1. No spinal canal stenosis. 2. Moderate right C5-6 neural foraminal stenosis due to uncovertebral osteophytes. 3. Small central disc protrusion at C6-C7 without spinal canal or neural foraminal stenosis. Electronically Signed   By: KUlyses JarredM.D.   On: 02/13/2021 21:05   MR THORACIC SPINE W WO CONTRAST  Result Date: 02/13/2021 CLINICAL DATA:  Myelopathy EXAM: MRI THORACIC WITHOUT AND WITH CONTRAST TECHNIQUE: Multiplanar and multiecho pulse sequences of the thoracic spine were obtained without and with intravenous contrast. CONTRAST:  453mGADAVIST GADOBUTROL 1 MMOL/ML IV SOLN COMPARISON:  None. FINDINGS: Alignment:  Physiologic. Vertebrae: No fracture, evidence of discitis, or bone lesion. Cord: Normal signal and morphology. No abnormal contrast enhancement Paraspinal and other soft tissues: Negative Disc levels: There is a small central disc protrusion at T10-11 without spinal canal or neural foraminal stenosis. The other disc levels are normal. IMPRESSION: 1. No thoracic spinal canal or neural foraminal stenosis. 2. Small central disc protrusion at T10-11 without spinal canal or neural foraminal stenosis. Electronically Signed   By: KeUlyses Jarred.D.   On: 02/13/2021 21:11   CT ABDOMEN PELVIS W CONTRAST  Result Date: 02/09/2021 CLINICAL DATA:  Abdominal pain with nausea vomiting EXAM: CT ABDOMEN AND PELVIS WITH CONTRAST TECHNIQUE: Multidetector CT imaging of the abdomen and pelvis was performed using the standard protocol following bolus administration of intravenous contrast. CONTRAST:  7523mMNIPAQUE IOHEXOL 300 MG/ML  SOLN COMPARISON:  None. FINDINGS: Lower chest: No acute abnormality. Normal size heart. Left anterior descending coronary artery calcifications. No significant pericardial effusion/thickening. Hepatobiliary: Hepatomegaly measuring 18.2 cm in maximum craniocaudal dimension. Diffusely  decreased attenuation of the hepatic parenchyma with geographic regions of hyperattenuation involving the gallbladder fossa anterior left lobe of the liver and posterior right lobe of the liver, which persist on delayed imaging, for instance on image 27/2 and 32/2. Gallbladder is grossly unremarkable. No biliary ductal dilation. Pancreas: Within normal limits. Spleen: Calcified splenic granulomata.  Normal size spleen. Adrenals/Urinary Tract: Adrenal glands are unremarkable. Kidneys are normal, without renal calculi, focal lesion, or hydronephrosis. Bladder is unremarkable. Stomach/Bowel: Mild symmetric thickening of the gastric antrum. Normal positioning of the duodenum/ligament of Treitz. Radiopaque enteric contrast traverses the hepatic flexure. No pathologic dilation of small bowel. Normal appendix. Gas and fluid visualized throughout the colon. Vascular/Lymphatic: Aortic atherosclerosis without aneurysmal dilation. The hepatic, portal and splenic veins appear patent. Prominent periportal lymph nodes measuring up to 5 mm on image 32/2, likely reactive. No pathologically enlarged abdominal or pelvic lymph nodes. Reproductive: Status post hysterectomy. No adnexal masses. Other: No abdominopelvic ascites. Musculoskeletal: Mild multilevel degenerative changes  spine. No aggressive lytic or blastic lesion of bone. IMPRESSION: 1. Hepatomegaly with diffuse hepatic hypoattenuation and superimposed geographic regions of hyperattenuation involving the gallbladder fossa, anterior left lobe of the liver and posterior right lobe of the liver, which persist on delayed imaging. Findings which likely reflect hepatic steatosis with focal fatty sparing. 2. Mild symmetric thickening of the gastric antrum, which may represent gastritis. 3. Gas and fluid visualized throughout the colon suggestive of diarrheal state. No evidence of bowel obstruction. 4. Aortic atherosclerosis.  Aortic Atherosclerosis (ICD10-I70.0). Electronically  Signed   By: Dahlia Bailiff MD   On: 02/09/2021 13:59   CT BIOPSY  Result Date: 02/11/2021 INDICATION: Anemia, thrombocytopenia EXAM: CT GUIDED RIGHT ILIAC BONE MARROW ASPIRATION AND CORE BIOPSY Date:  02/11/2021 02/11/2021 10:16 am Radiologist:  M. Daryll Brod, MD Guidance:  CT FLUOROSCOPY TIME:  Fluoroscopy Time: None. MEDICATIONS: 1% lidocaine local ANESTHESIA/SEDATION: 2.0 mg IV Versed; 100 mcg IV Fentanyl Moderate Sedation Time:  10 minutes The patient was continuously monitored during the procedure by the interventional radiology nurse under my direct supervision. CONTRAST:  None. COMPLICATIONS: None PROCEDURE: Informed consent was obtained from the patient following explanation of the procedure, risks, benefits and alternatives. The patient understands, agrees and consents for the procedure. All questions were addressed. A time out was performed. The patient was positioned prone and non-contrast localization CT was performed of the pelvis to demonstrate the iliac marrow spaces. Maximal barrier sterile technique utilized including caps, mask, sterile gowns, sterile gloves, large sterile drape, hand hygiene, and Betadine prep. Under sterile conditions and local anesthesia, an 11 gauge coaxial bone biopsy needle was advanced into the right iliac marrow space. Needle position was confirmed with CT imaging. Initially, bone marrow aspiration was performed. Next, the 11 gauge outer cannula was utilized to obtain a right iliac bone marrow core biopsy. Needle was removed. Hemostasis was obtained with compression. The patient tolerated the procedure well. Samples were prepared with the cytotechnologist. No immediate complications. IMPRESSION: CT guided right iliac bone marrow aspiration and core biopsy. Electronically Signed   By: Jerilynn Mages.  Shick M.D.   On: 02/11/2021 11:28   CT BONE MARROW BIOPSY & ASPIRATION  Result Date: 02/11/2021 INDICATION: Anemia, thrombocytopenia EXAM: CT GUIDED RIGHT ILIAC BONE MARROW  ASPIRATION AND CORE BIOPSY Date:  02/11/2021 02/11/2021 10:16 am Radiologist:  M. Daryll Brod, MD Guidance:  CT FLUOROSCOPY TIME:  Fluoroscopy Time: None. MEDICATIONS: 1% lidocaine local ANESTHESIA/SEDATION: 2.0 mg IV Versed; 100 mcg IV Fentanyl Moderate Sedation Time:  10 minutes The patient was continuously monitored during the procedure by the interventional radiology nurse under my direct supervision. CONTRAST:  None. COMPLICATIONS: None PROCEDURE: Informed consent was obtained from the patient following explanation of the procedure, risks, benefits and alternatives. The patient understands, agrees and consents for the procedure. All questions were addressed. A time out was performed. The patient was positioned prone and non-contrast localization CT was performed of the pelvis to demonstrate the iliac marrow spaces. Maximal barrier sterile technique utilized including caps, mask, sterile gowns, sterile gloves, large sterile drape, hand hygiene, and Betadine prep. Under sterile conditions and local anesthesia, an 11 gauge coaxial bone biopsy needle was advanced into the right iliac marrow space. Needle position was confirmed with CT imaging. Initially, bone marrow aspiration was performed. Next, the 11 gauge outer cannula was utilized to obtain a right iliac bone marrow core biopsy. Needle was removed. Hemostasis was obtained with compression. The patient tolerated the procedure well. Samples were prepared with the cytotechnologist. No immediate  complications. IMPRESSION: CT guided right iliac bone marrow aspiration and core biopsy. Electronically Signed   By: Jerilynn Mages.  Shick M.D.   On: 02/11/2021 11:28   DG FLUORO GUIDE LUMBAR PUNCTURE  Result Date: 02/15/2021 CLINICAL DATA:  Tetraplegia. EXAM: DIAGNOSTIC LUMBAR PUNCTURE UNDER FLUOROSCOPIC GUIDANCE COMPARISON:  Thoracic spine MRI 02/13/2021. CT abdomen/pelvis 02/09/2021. FLUOROSCOPY TIME:  Fluoroscopy Time:  6 seconds Radiation Exposure Index (if provided by the  fluoroscopic device): 4.6 mGy Number of Acquired Spot Images: None PROCEDURE: Informed consent was obtained from the patient prior to the procedure, including a discussion of potential complications including but not limited to headache, allergy, pain, bleeding, infection, spinal cord/nerve root injury. With the patient prone, the lower back was prepped with Betadine. 1% Lidocaine was used for local anesthesia. Lumbar puncture was performed at the L3-L4 level using a 20 gauge needle with return of clear CSF. 16 ml of CSF were obtained for laboratory studies. The patient tolerated the procedure well without immediate postprocedure complication. IMPRESSION: Technically successful L3-L4 fluoroscopically guided lumbar puncture. 16 mL of CSF obtained for laboratory studies. No immediate post-procedure complication. Electronically Signed   By: Kellie Simmering DO   On: 02/15/2021 18:05    Microbiology: Recent Results (from the past 240 hour(s))  SARS CORONAVIRUS 2 (TAT 6-24 HRS) Nasopharyngeal Nasopharyngeal Swab     Status: None   Collection Time: 02/24/21  6:50 PM   Specimen: Nasopharyngeal Swab  Result Value Ref Range Status   SARS Coronavirus 2 NEGATIVE NEGATIVE Final    Comment: (NOTE) SARS-CoV-2 target nucleic acids are NOT DETECTED.  The SARS-CoV-2 RNA is generally detectable in upper and lower respiratory specimens during the acute phase of infection. Negative results do not preclude SARS-CoV-2 infection, do not rule out co-infections with other pathogens, and should not be used as the sole basis for treatment or other patient management decisions. Negative results must be combined with clinical observations, patient history, and epidemiological information. The expected result is Negative.  Fact Sheet for Patients: SugarRoll.be  Fact Sheet for Healthcare Providers: https://www.woods-mathews.com/  This test is not yet approved or cleared by the [place] FDA and  has been authorized for detection and/or diagnosis of SARS-CoV-2 by FDA under an Emergency Use Authorization (EUA). This EUA will remain  in effect (meaning this test can be used) for the duration of the COVID-19 declaration under Se ction 564(b)(1) of the Act, 21 U.S.C. section 360bbb-3(b)(1), unless the authorization is terminated or revoked sooner.  Performed at [facility], [place] [address] [place], [place] [address]      Labs: Basic Metabolic Panel: Recent Labs  Lab 02/21/21 [phone] 02/22/21 0326 02/23/21 0318 02/24/21 0348 02/25/21 0933  NA 132*  --   --   --  132*  K 4.1  --   --   --  4.0  CL 102  --   --   --  99  CO2 26  --   --   --  24  GLUCOSE 99  --   --   --  148*  BUN 7  --   --   --  10  CREATININE 0.52  --   --   --  0.56  CALCIUM 8.8*  --   --   --  9.0  MG 2.0 2.1 2.0 1.9  --    Liver Function Tests: Recent Labs  Lab 02/21/21 0334  AST 43*  ALT 19  ALKPHOS 68  BILITOT 1.1  PROT 8.2*  ALBUMIN 2.8*   No results for input(s): LIPASE, AMYLASE in the last 168 hours. No results for input(s): AMMONIA in the last 168 hours. CBC: Recent Labs  Lab 02/21/21 0334 02/22/21 0326 02/23/21 0318 02/24/21 0348  WBC 6.2 6.4 5.3 6.5  NEUTROABS 3.8  --  2.9 4.1  HGB 8.9* 8.8* 8.7* 8.5*  HCT 27.3* 26.4* 26.1* 26.7*  MCV 105.4* 104.3* 104.0* 107.7*  PLT 279 350 349 358   Cardiac Enzymes: No results for input(s): CKTOTAL, CKMB, CKMBINDEX, TROPONINI in the last 168 hours. BNP: BNP (last 3 results) No results for input(s): BNP in the last 8760 hours.  ProBNP (last 3 results) No results for input(s): PROBNP in the last 8760 hours.  CBG: No results for input(s): GLUCAP in the last 168 hours.  Active Problems:   Hypokalemia   Lower urinary tract infectious disease   Thrombocytopenia (HCC)   Dietary folate deficiency anemia   Anemia   Weakness of both lower extremities   Weakness of both upper extremities   Sinus tachycardia    Protein-calorie malnutrition, severe   Time coordinating discharge: More than 30 minutes.  Signed:        Kynlei Piontek, DO [facility]  02/27/2021, 8:52 AM

## 2021-03-01 LAB — VITAMIN B1: Vitamin B1 (Thiamine): 18 nmol/L — ABNORMAL LOW (ref 66.5–200.0)

## 2021-03-02 ENCOUNTER — Other Ambulatory Visit: Payer: Self-pay | Admitting: *Deleted

## 2021-03-02 DIAGNOSIS — D696 Thrombocytopenia, unspecified: Secondary | ICD-10-CM

## 2021-03-03 ENCOUNTER — Inpatient Hospital Stay: Payer: BC Managed Care – PPO

## 2021-03-03 ENCOUNTER — Inpatient Hospital Stay: Payer: BC Managed Care – PPO | Admitting: Internal Medicine

## 2021-03-04 ENCOUNTER — Telehealth: Payer: Self-pay | Admitting: Internal Medicine

## 2021-03-04 NOTE — Telephone Encounter (Signed)
R/s appts per 7/6 sch msg. Called pt, no answer. Left msg with appts date and times.  

## 2021-03-13 NOTE — Progress Notes (Signed)
Thiamine level came back low at 18.0. This level was drawn prior to starting PO thiamine 100 mg qd, which the patient is currently taking at rehab, per sister. Called sister Vernona Rieger to relay results as no PCP listed currently. Patient now at [facility]. They are unable to administer IV thiamine per RN. She has an appointment with [facility] on Tuesday. Sister will attempt to obtain the name of the Neurologist to whom thiamine level should be sent. I have recommended at the discretion of outpatient neurologist, either thiamine level to be redrawn and wait level to return which may take up to 3 weeks, or to treat empirically with 500 mg IV thiamine TID x 3 days.   Electronically signed: Dr. Caryl Pina

## 2021-03-19 NOTE — Plan of Care (Signed)
Discussed by telephone with PA Charlett Nose at [facility] on Monday 7/18 my recommendations to obtain a follow up thiamine level and administer empiric IV thiamine at 500 mg TID x 3 days then 250 mg TID x 3 days, then to continue with PO thiamine 100 mg qd indefinitely. Made follow up call to patient's sister [name] [name] on Thursday 7/21 to obtain updates; [name] [name] has had her thiamine level drawn and IV placed with first dose scheduled for Thursday evening. The patient has an outpatient follow up appointment scheduled with Dr. Arbutus Ped of the Oncology team here at [facility] on 7/26 at 1:15 PM. She also has a new patient visit scheduled in August with [facility].   Sister feels that [name] [name]'s condition is not improving, that she is still weak and has had some cognitive changes including hallucinations at the rehabilitation center. Sister asked me to contact [facility] to see if an earlier appointment can be scheduled. I called and spoke with MD on call at the practice (Dr. Ephraim Hamburger) who let me know that no sooner appointment could be scheduled due to very high volumes at his practice, and that if a sooner evaluation was needed, she should be brought to the ED. 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 I called [name] [name] back on Thursday to relay this information to her. [name] [name] has been acting as her sister's HPOA as [name] [name]'s cognitive worsening has made it difficult for her to make decisions on her own.   The patient's neurological presentation is complex and there may be a combination of several factors to explain her weakness and areflexia at presentation with subsequent cognitive worsening, including alcoholic neuropathy (per sister family is trying to determine  recent levels of alcohol consumption by going through grocery receipts; sister states that the patient has a history of alcohol abuse and denied recent use despite the receipts showing alcohol purchases), CIDP/AIDP, B12 deficiency (level of 374 was within normal limits  per official laboratory ranges but slightly low by neurological standards and homocysteine was elevated at 26, consistent with a functional deficiency), thiamine deficiency and protein calorie malnutrition. She may need further work up to determine if there are other potential contributing factors.   Electronically signed: Dr. Caryl Pina

## 2021-03-23 ENCOUNTER — Inpatient Hospital Stay: Payer: BC Managed Care – PPO | Admitting: Internal Medicine

## 2021-03-23 ENCOUNTER — Inpatient Hospital Stay: Payer: BC Managed Care – PPO

## 2023-03-24 ENCOUNTER — Other Ambulatory Visit: Payer: Self-pay | Admitting: Internal Medicine

## 2023-03-24 DIAGNOSIS — D696 Thrombocytopenia, unspecified: Secondary | ICD-10-CM

## 2023-03-25 ENCOUNTER — Inpatient Hospital Stay: Payer: BC Managed Care – PPO

## 2023-03-25 ENCOUNTER — Encounter: Payer: Self-pay | Admitting: Internal Medicine

## 2023-03-25 ENCOUNTER — Inpatient Hospital Stay: Payer: BC Managed Care – PPO | Attending: Internal Medicine | Admitting: Internal Medicine

## 2023-03-25 VITALS — BP 134/91 | HR 71 | Temp 98.0°F | Resp 18 | Ht 62.0 in | Wt 135.8 lb

## 2023-03-25 DIAGNOSIS — E559 Vitamin D deficiency, unspecified: Secondary | ICD-10-CM | POA: Diagnosis not present

## 2023-03-25 DIAGNOSIS — K760 Fatty (change of) liver, not elsewhere classified: Secondary | ICD-10-CM | POA: Insufficient documentation

## 2023-03-25 DIAGNOSIS — G629 Polyneuropathy, unspecified: Secondary | ICD-10-CM | POA: Insufficient documentation

## 2023-03-25 DIAGNOSIS — E519 Thiamine deficiency, unspecified: Secondary | ICD-10-CM | POA: Insufficient documentation

## 2023-03-25 DIAGNOSIS — I10 Essential (primary) hypertension: Secondary | ICD-10-CM | POA: Insufficient documentation

## 2023-03-25 DIAGNOSIS — D696 Thrombocytopenia, unspecified: Secondary | ICD-10-CM

## 2023-03-25 LAB — CBC WITH DIFFERENTIAL (CANCER CENTER ONLY)
Abs Immature Granulocytes: 0.01 10*3/uL (ref 0.00–0.07)
Basophils Absolute: 0 10*3/uL (ref 0.0–0.1)
Basophils Relative: 1 %
Eosinophils Absolute: 0.1 10*3/uL (ref 0.0–0.5)
Eosinophils Relative: 2 %
HCT: 37 % (ref 36.0–46.0)
Hemoglobin: 12.7 g/dL (ref 12.0–15.0)
Immature Granulocytes: 0 %
Lymphocytes Relative: 21 %
Lymphs Abs: 1.1 10*3/uL (ref 0.7–4.0)
MCH: 32.2 pg (ref 26.0–34.0)
MCHC: 34.3 g/dL (ref 30.0–36.0)
MCV: 93.9 fL (ref 80.0–100.0)
Monocytes Absolute: 0.4 10*3/uL (ref 0.1–1.0)
Monocytes Relative: 8 %
Neutro Abs: 3.6 10*3/uL (ref 1.7–7.7)
Neutrophils Relative %: 68 %
Platelet Count: 62 10*3/uL — ABNORMAL LOW (ref 150–400)
RBC: 3.94 MIL/uL (ref 3.87–5.11)
RDW: 12.5 % (ref 11.5–15.5)
WBC Count: 5.3 10*3/uL (ref 4.0–10.5)
nRBC: 0 % (ref 0.0–0.2)

## 2023-03-25 LAB — HIV ANTIBODY (ROUTINE TESTING W REFLEX): HIV Screen 4th Generation wRfx: NONREACTIVE

## 2023-03-25 LAB — CMP (CANCER CENTER ONLY)
ALT: 27 U/L (ref 0–44)
AST: 29 U/L (ref 15–41)
Albumin: 4.6 g/dL (ref 3.5–5.0)
Alkaline Phosphatase: 50 U/L (ref 38–126)
Anion gap: 9 (ref 5–15)
BUN: 11 mg/dL (ref 8–23)
CO2: 24 mmol/L (ref 22–32)
Calcium: 9.9 mg/dL (ref 8.9–10.3)
Chloride: 106 mmol/L (ref 98–111)
Creatinine: 0.7 mg/dL (ref 0.44–1.00)
GFR, Estimated: >60 mL/min (ref >60)
Glucose, Bld: 106 mg/dL — ABNORMAL HIGH (ref 70–99)
Potassium: 3.8 mmol/L (ref 3.5–5.1)
Sodium: 139 mmol/L (ref 135–145)
Total Bilirubin: 0.5 mg/dL (ref 0.3–1.2)
Total Protein: 7.4 g/dL (ref 6.5–8.1)

## 2023-03-25 LAB — LACTATE DEHYDROGENASE: LDH: 191 U/L (ref 98–192)

## 2023-03-25 LAB — VITAMIN B12: Vitamin B-12: 1055 pg/mL — ABNORMAL HIGH (ref 180–914)

## 2023-03-25 LAB — FOLATE: Folate: >40 ng/mL (ref >5.9)

## 2023-03-25 LAB — TSH: TSH: 0.414 u[IU]/mL (ref 0.350–4.500)

## 2023-03-25 LAB — IRON AND IRON BINDING CAPACITY (CC-WL,HP ONLY)
Iron: 99 ug/dL (ref 28–170)
Saturation Ratios: 30 % (ref 10.4–31.8)
TIBC: 330 ug/dL (ref 250–450)
UIBC: 231 ug/dL (ref 148–442)

## 2023-03-25 LAB — HEPATITIS PANEL, ACUTE
HCV Ab: NONREACTIVE
Hep A IgM: NONREACTIVE
Hep B C IgM: NONREACTIVE
Hepatitis B Surface Ag: NONREACTIVE

## 2023-03-25 LAB — FERRITIN: Ferritin: 109 ng/mL (ref 11–307)

## 2023-03-25 NOTE — Progress Notes (Signed)
[facility] [phone]   Fax:([phone]  CONSULT NOTE  REFERRING PHYSICIAN: Dr. [name]  REASON FOR CONSULTATION:  62 years old with persistent thrombocytopenia.  HPI [name] [name] is a 62 y.o. female with past medical history significant for hypertension, thiamine deficiency as well as polyneuropathy, vitamin D deficiency and hepatic steatosis with hepatomegaly.  The patient also has long history of thrombocytopenia.  She was seen by her primary care provider and repeat blood work on 10/10/2022 showed normal total white blood count of 6.3, hemoglobin normal at 13.5 with hematocrit of 40.3% but platelets count were low at 76,000.  Comprehensive metabolic panel, PT/INR as well as alpha-fetoprotein were normal.  She is followed by hematologist at [facility].  The patient has history of alcohol abuse in the past but she quit in 2023.  She has been on several medication for her polyneuropathy including gabapentin and Cymbalta.  She is also on metoprolol for her hypertension.  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  FISH study for MDS and cytogenetics were performed and reported negative for monoclonal B-cell or phenotypically aberrant T-cell population.  The patient was advised to continue with close monitoring and observation. She was referred back to me for evaluation because of her persistent thrombocytopenia. When seen today the patient is feeling fine with no concerning complaints.  She has no bleeding, bruises or ecchymosis.  She has no nausea, vomiting, diarrhea or constipation.  She has no headache or visual changes. Family history significant for mother with stroke.  Father had coronary artery disease and pulmonary fibrosis.  Nephew had chron's disease. The patient is single and has  no children.  She used to work as a Engineer, site but currently retired.  She has no history for smoking and used to drink alcohol in the past but quit in 2022 and no history of drug abuse.  HPI  Past Medical History:  Diagnosis Date   Hepatic steatosis    Hepatomegaly    HTN (hypertension)    Polyneuropathy    Thiamine deficiency neuropathy    Vitamin D deficiency     No past surgical history on file.  Family History  Problem Relation Age of Onset   Stroke Mother    Heart failure Father    Pulmonary fibrosis Father     Social History Social History   Tobacco Use   Smoking status: Never   Smokeless tobacco: Never  Substance Use Topics   Alcohol use: Yes    Comment: spcoa;   Drug use: Never    No Known Allergies  Current Outpatient Medications  Medication Sig Dispense Refill   amLODipine (NORVASC) 5 MG tablet Take 1 tablet (5 mg total) by mouth daily. 30 tablet 0   artificial tears (LACRILUBE) OINT ophthalmic ointment Place into both eyes every 4 (four) hours as needed for dry eyes. 3.5 g 0   calcium carbonate (OS-CAL - DOSED IN MG OF ELEMENTAL CALCIUM) 1250 (500 Ca) MG tablet Take 1 tablet (500 mg of elemental calcium total) by mouth 2 (two) times daily with a meal. 60 tablet 0   feeding supplement (ENSURE ENLIVE / ENSURE PLUS) LIQD Take 237 mLs by mouth 3 (three) times daily between meals. 237 mL 12   folic acid (FOLVITE) 1 MG tablet Take 1 tablet (1 mg  total) by mouth daily. 30 tablet 0   liver oil-zinc oxide (DESITIN) 40 % ointment Apply topically as needed for irritation. 56.7 g 0   LORazepam (ATIVAN) 0.5 MG tablet Take 1 tablet (0.5 mg total) by mouth every 8 (eight) hours as needed for anxiety (nausea). 30 tablet 0   magnesium oxide (MAG-OX) 400 (240 Mg) MG tablet Take 0.5 tablets (200 mg total) by mouth 2 (two) times daily. 60 tablet 0   metoprolol tartrate (LOPRESSOR) 100 MG tablet Take 1 tablet (100 mg total) by mouth 2 (two) times daily. 60 tablet 0    Multiple Vitamin (MULTIVITAMIN WITH MINERALS) TABS tablet Take 1 tablet by mouth daily. 30 tablet 0   naproxen sodium (ALEVE) 220 MG tablet Take 220 mg by mouth daily as needed (pain).     pantoprazole (PROTONIX) 40 MG tablet Take 1 tablet (40 mg total) by mouth 2 (two) times daily. 30 tablet 0   polyethylene glycol (MIRALAX / GLYCOLAX) 17 g packet Take 17 g by mouth daily. 14 each 0   potassium chloride (KLOR-CON) 20 MEQ packet Take 40 mEq by mouth daily. 30 packet 0   sucralfate (CARAFATE) 1 GM/10ML suspension Take 10 mLs (1 g total) by mouth 4 (four) times daily -  with meals and at bedtime. 420 mL 0   thiamine 100 MG tablet Take 1 tablet (100 mg total) by mouth daily. 30 tablet 0   traMADol (ULTRAM) 50 MG tablet Take 1 tablet (50 mg total) by mouth every 6 (six) hours as needed for severe pain. 14 tablet 0   vitamin B-12 2000 MCG tablet Take 1 tablet (2,000 mcg total) by mouth daily. 30 tablet 0   witch hazel-glycerin (TUCKS) pad Apply topically as needed for hemorrhoids. 40 each 12   No current facility-administered medications for this visit.    Review of Systems  Constitutional: negative Eyes: negative Ears, nose, mouth, throat, and face: negative Respiratory: negative Cardiovascular: negative Gastrointestinal: negative Genitourinary:negative Integument/breast: negative Hematologic/lymphatic: negative Musculoskeletal:negative Neurological: negative Behavioral/Psych: negative Endocrine: negative Allergic/Immunologic: negative  Physical Exam  NUU:VOZDG, healthy, no distress, well nourished, well developed, and anxious SKIN: skin color, texture, turgor are normal, no rashes or significant lesions HEAD: Normocephalic, No masses, lesions, tenderness or abnormalities EYES: normal, PERRLA, Conjunctiva are pink and non-injected EARS: External ears normal, Canals clear OROPHARYNX:no exudate, no erythema, and lips, buccal mucosa, and tongue normal  NECK: supple, no adenopathy, no  JVD LYMPH:  no palpable lymphadenopathy, no hepatosplenomegaly BREAST:not examined LUNGS: clear to auscultation , and palpation HEART: regular rate & rhythm and no murmurs ABDOMEN:abdomen soft, non-tender, normal bowel sounds, and no masses or organomegaly BACK: Back symmetric, no curvature., No CVA tenderness EXTREMITIES:no joint deformities, effusion, or inflammation, no edema  NEURO: alert & oriented x 3 with fluent speech, no focal motor/sensory deficits  PERFORMANCE STATUS: ECOG 1  LABORATORY DATA: Lab Results  Component Value Date   WBC 6.5 02/24/2021   HGB 8.5 (L) 02/24/2021   HCT 26.7 (L) 02/24/2021   MCV 107.7 (H) 02/24/2021   PLT 358 02/24/2021      Chemistry      Component Value Date/Time   NA 132 (L) 02/25/2021 0933   K 4.0 02/25/2021 0933   CL 99 02/25/2021 0933   CO2 24 02/25/2021 0933   BUN 10 02/25/2021 0933   CREATININE 0.56 02/25/2021 0933      Component Value Date/Time   CALCIUM 9.0 02/25/2021 0933   ALKPHOS 68 02/21/2021 0334   AST 43 (  H) 02/21/2021 0334   ALT 19 02/21/2021 0334   BILITOT 1.1 02/21/2021 0334       RADIOGRAPHIC STUDIES: No results found.  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  This was initially thought to be secondary to liver cirrhosis but the patient had very mild cirrhosis and she quit alcohol in 2022 beside her platelets count was normal in 2022 before it started declining again.  It has been waxing and waning over the last several years.  Most recently her platelet count has been on the lower side but the patient is asymptomatic.   PLAN: I had a lengthy discussion with the patient today about her current condition and further investigation to rule out any other etiology. I explained to the patient that ITP is usually either immune mediated or idiopathic and it is a diagnosis of exclusion most of the time. I recommended for the patient to continue on  observation with repeat blood work in 6 months. I would not take any intervention for her platelet count at this point unless the patient has symptoms with bleeding, bruises or ecchymosis or platelets count less than 30,000.  In that case I may consider her for treatment with steroids or IVIG. She was advised to call immediately if she has any other concerning symptoms in the interval. The patient voices understanding of current disease status and treatment options and is in agreement with the current care plan.  All questions were answered. The patient knows to call the clinic with any problems, questions or concerns. We can certainly see the patient much sooner if necessary.  Thank you so much for allowing me to participate in the care of [name] [name]. I will continue to follow up the patient with you and assist in her care.  The total time spent in the appointment was 55 minutes.  Disclaimer: This note was dictated with voice recognition software. Similar sounding words can inadvertently be transcribed and may not be corrected upon review.   Lajuana Matte March 25, 2023, 9:07 AM

## 2023-03-28 LAB — VITAMIN B1: Vitamin B1 (Thiamine): 148.8 nmol/L (ref 66.5–200.0)

## 2023-09-25 ENCOUNTER — Inpatient Hospital Stay (HOSPITAL_BASED_OUTPATIENT_CLINIC_OR_DEPARTMENT_OTHER): Payer: 59 | Admitting: Internal Medicine

## 2023-09-25 ENCOUNTER — Inpatient Hospital Stay: Payer: 59 | Attending: Internal Medicine

## 2023-09-25 VITALS — BP 170/101 | HR 100 | Temp 98.3°F | Resp 17 | Ht 62.0 in | Wt 142.3 lb

## 2023-09-25 DIAGNOSIS — I10 Essential (primary) hypertension: Secondary | ICD-10-CM | POA: Diagnosis not present

## 2023-09-25 DIAGNOSIS — G629 Polyneuropathy, unspecified: Secondary | ICD-10-CM | POA: Insufficient documentation

## 2023-09-25 DIAGNOSIS — D693 Immune thrombocytopenic purpura: Secondary | ICD-10-CM | POA: Insufficient documentation

## 2023-09-25 DIAGNOSIS — D696 Thrombocytopenia, unspecified: Secondary | ICD-10-CM

## 2023-09-25 DIAGNOSIS — Z79899 Other long term (current) drug therapy: Secondary | ICD-10-CM | POA: Insufficient documentation

## 2023-09-25 LAB — CBC WITH DIFFERENTIAL (CANCER CENTER ONLY)
Abs Immature Granulocytes: 0.01 10*3/uL (ref 0.00–0.07)
Basophils Absolute: 0.1 10*3/uL (ref 0.0–0.1)
Basophils Relative: 1 %
Eosinophils Absolute: 0.1 10*3/uL (ref 0.0–0.5)
Eosinophils Relative: 2 %
HCT: 39.8 % (ref 36.0–46.0)
Hemoglobin: 14 g/dL (ref 12.0–15.0)
Immature Granulocytes: 0 %
Lymphocytes Relative: 21 %
Lymphs Abs: 1.4 10*3/uL (ref 0.7–4.0)
MCH: 31.9 pg (ref 26.0–34.0)
MCHC: 35.2 g/dL (ref 30.0–36.0)
MCV: 90.7 fL (ref 80.0–100.0)
Monocytes Absolute: 0.5 10*3/uL (ref 0.1–1.0)
Monocytes Relative: 8 %
Neutro Abs: 4.7 10*3/uL (ref 1.7–7.7)
Neutrophils Relative %: 68 %
Platelet Count: 67 10*3/uL — ABNORMAL LOW (ref 150–400)
RBC: 4.39 MIL/uL (ref 3.87–5.11)
RDW: 12.7 % (ref 11.5–15.5)
WBC Count: 6.9 10*3/uL (ref 4.0–10.5)
nRBC: 0 % (ref 0.0–0.2)

## 2023-09-25 LAB — LACTATE DEHYDROGENASE: LDH: 172 U/L (ref 98–192)

## 2023-09-25 NOTE — Progress Notes (Signed)
[name] Telephone:(336) [phone]   Fax:(336) [phone]  OFFICE PROGRESS NOTE  [name] Mustard, MD [address] [place] [address]  DIAGNOSIS: Thrombocytopenia, likely ITP.  PRIOR THERAPY: None  CURRENT THERAPY: Observation  INTERVAL HISTORY: [name] [name] 63 y.o. female returns to the clinic today for follow-up visit.Discussed the use of AI scribe software for clinical note transcription with the patient, who gave verbal consent to proceed.  History of Present Illness   The patient, a 63 year old with a history of idiopathic thrombocytopenic purpura (ITP), presents for a follow-up visit. She reports ongoing issues with peripheral neuropathy, particularly in the feet, which has been a persistent problem. The neuropathy is managed with Cymbalta, prescribed by her primary care physician, Dr. Artis Flock. She previously tried Gabapentin for this issue.  The patient denies any new symptoms, including chest pain, shortness of breath, or bleeding issues. She had a minor fall in December, resulting in a bruise on the knee, which has since resolved.  The patient's platelet count remains low, consistent with her ITP diagnosis, but stable compared to previous readings. She denies any symptoms related to this condition, such as bleeding or bruising.  The patient also reports occasional hypertension, which she monitors at home. She is scheduled to follow up with her primary care physician in June for this issue.       MEDICAL HISTORY: Past Medical History:  Diagnosis Date   Hepatic steatosis    Hepatomegaly    HTN (hypertension)    Polyneuropathy    Thiamine deficiency neuropathy    Vitamin D deficiency     ALLERGIES:  has no known allergies.  MEDICATIONS:  Current Outpatient Medications  Medication Sig Dispense Refill   artificial tears (LACRILUBE) OINT ophthalmic ointment Place into both eyes every 4 (four) hours as needed for dry eyes. 3.5 g 0   DULoxetine  (CYMBALTA) 20 MG capsule Take 20 mg by mouth daily.     feeding supplement (ENSURE ENLIVE / ENSURE PLUS) LIQD Take 237 mLs by mouth 3 (three) times daily between meals. 237 mL 12   liver oil-zinc oxide (DESITIN) 40 % ointment Apply topically as needed for irritation. 56.7 g 0   LORazepam (ATIVAN) 0.5 MG tablet Take 1 tablet (0.5 mg total) by mouth every 8 (eight) hours as needed for anxiety (nausea). 30 tablet 0   metoprolol tartrate (LOPRESSOR) 100 MG tablet Take 1 tablet (100 mg total) by mouth 2 (two) times daily. 60 tablet 0   Multiple Vitamin (MULTIVITAMIN WITH MINERALS) TABS tablet Take 1 tablet by mouth daily. 30 tablet 0   pantoprazole (PROTONIX) 40 MG tablet Take 1 tablet (40 mg total) by mouth 2 (two) times daily. 30 tablet 0   rosuvastatin (CRESTOR) 10 MG tablet Take 10 mg by mouth daily.     vitamin B-12 2000 MCG tablet Take 1 tablet (2,000 mcg total) by mouth daily. 30 tablet 0   No current facility-administered medications for this visit.    SURGICAL HISTORY: No past surgical history on file.  REVIEW OF SYSTEMS:  A comprehensive review of systems was negative except for: Constitutional: positive for fatigue Neurological: positive for paresthesia   PHYSICAL EXAMINATION: General appearance: alert, cooperative, and no distress Head: Normocephalic, without obvious abnormality, atraumatic Neck: no adenopathy, no JVD, supple, symmetrical, trachea midline, and thyroid not enlarged, symmetric, no tenderness/mass/nodules Lymph nodes: Cervical, supraclavicular, and axillary nodes normal. Resp: clear to auscultation bilaterally Back: symmetric, no curvature. ROM normal. No CVA tenderness.  Cardio: regular rate and rhythm, S1, S2 normal, no murmur, click, rub or gallop GI: soft, non-tender; bowel sounds normal; no masses,  no organomegaly Extremities: extremities normal, atraumatic, no cyanosis or edema  ECOG PERFORMANCE STATUS: 1 - Symptomatic but completely ambulatory  Blood pressure  (!) 170/101, pulse 100, temperature 98.3 F (36.8 C), temperature source Temporal, resp. rate 17, height 5\' 2"  (1.575 m), weight 142 lb 4.8 oz (64.5 kg), SpO2 100%.  LABORATORY DATA: Lab Results  Component Value Date   WBC 6.9 09/25/2023   HGB 14.0 09/25/2023   HCT 39.8 09/25/2023   MCV 90.7 09/25/2023   PLT 67 (L) 09/25/2023      Chemistry      Component Value Date/Time   NA 139 03/25/2023 1007   K 3.8 03/25/2023 1007   CL 106 03/25/2023 1007   CO2 24 03/25/2023 1007   BUN 11 03/25/2023 1007   CREATININE 0.70 03/25/2023 1007      Component Value Date/Time   CALCIUM 9.9 03/25/2023 1007   ALKPHOS 50 03/25/2023 1007   AST 29 03/25/2023 1007   ALT 27 03/25/2023 1007   BILITOT 0.5 03/25/2023 1007       RADIOGRAPHIC STUDIES: No results found.  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  She also has a history of mild liver steatosis.     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 No bleeding or bruising symptoms. Treatment deferred due to potential side effects outweighing benefits. - Repeat CBC in six months - Monitor for bleeding or bruising symptoms - Contact if issues arise  Peripheral Neuropathy Chronic peripheral neuropathy affecting the feet, managed with Cymbalta. Previous treatment with gabapentin. Neurology referral deemed unnecessary unless symptoms worsen. - Continue Cymbalta as prescribed - Monitor symptoms and consider neurology referral if condition worsens  Hypertension Elevated blood pressure noted. Patient monitors blood pressure at home. Follow-up with primary care physician scheduled for June. - Monitor blood pressure at home - Follow-up with primary care physician in June  General Health Maintenance Resolved knee bruise from a fall in December. No other significant health maintenance issues  discussed.  Follow-up - Follow-up appointment in six months - Contact if issues arise before the next appointment.     The patient voices understanding of current disease status and treatment options and is in agreement with the current care plan.  All questions were answered. The patient knows to call the clinic with any problems, questions or concerns. We can certainly see the patient much sooner if necessary.  The total time spent in the appointment was 20 minutes.  Disclaimer: This note was dictated with voice recognition software. Similar sounding words can inadvertently be transcribed and may not be corrected upon review.

## 2024-03-15 ENCOUNTER — Encounter: Payer: Self-pay | Admitting: Advanced Practice Midwife

## 2024-03-25 ENCOUNTER — Inpatient Hospital Stay (HOSPITAL_BASED_OUTPATIENT_CLINIC_OR_DEPARTMENT_OTHER): Payer: 59 | Admitting: Internal Medicine

## 2024-03-25 ENCOUNTER — Inpatient Hospital Stay: Payer: 59 | Attending: Internal Medicine

## 2024-03-25 VITALS — BP 138/93 | HR 109 | Temp 97.2°F | Resp 17 | Ht 62.0 in | Wt 142.3 lb

## 2024-03-25 DIAGNOSIS — D696 Thrombocytopenia, unspecified: Secondary | ICD-10-CM

## 2024-03-25 DIAGNOSIS — Z79899 Other long term (current) drug therapy: Secondary | ICD-10-CM | POA: Diagnosis not present

## 2024-03-25 DIAGNOSIS — G629 Polyneuropathy, unspecified: Secondary | ICD-10-CM | POA: Diagnosis not present

## 2024-03-25 LAB — CBC WITH DIFFERENTIAL (CANCER CENTER ONLY)
Abs Immature Granulocytes: 0.03 K/uL (ref 0.00–0.07)
Basophils Absolute: 0.1 K/uL (ref 0.0–0.1)
Basophils Relative: 1 %
Eosinophils Absolute: 0.1 K/uL (ref 0.0–0.5)
Eosinophils Relative: 2 %
HCT: 39.8 % (ref 36.0–46.0)
Hemoglobin: 13.8 g/dL (ref 12.0–15.0)
Immature Granulocytes: 0 %
Lymphocytes Relative: 15 %
Lymphs Abs: 1.1 K/uL (ref 0.7–4.0)
MCH: 33.2 pg (ref 26.0–34.0)
MCHC: 34.7 g/dL (ref 30.0–36.0)
MCV: 95.7 fL (ref 80.0–100.0)
Monocytes Absolute: 0.7 K/uL (ref 0.1–1.0)
Monocytes Relative: 10 %
Neutro Abs: 5.5 K/uL (ref 1.7–7.7)
Neutrophils Relative %: 72 %
Platelet Count: 61 K/uL — ABNORMAL LOW (ref 150–400)
RBC: 4.16 MIL/uL (ref 3.87–5.11)
RDW: 13.2 % (ref 11.5–15.5)
WBC Count: 7.5 K/uL (ref 4.0–10.5)
nRBC: 0 % (ref 0.0–0.2)

## 2024-03-25 LAB — LACTATE DEHYDROGENASE: LDH: 171 U/L (ref 98–192)

## 2024-03-25 NOTE — Progress Notes (Signed)
 [facility] Telephone:([phone]   Fax:([phone]  OFFICE PROGRESS NOTE  Waddell Rake, MD [address] [place] [address]  DIAGNOSIS: Thrombocytopenia, likely ITP.  PRIOR THERAPY: None  CURRENT THERAPY: Observation  INTERVAL HISTORY: [name] [name] 63 y.o. female returns to the clinic today for follow-up visit.Discussed the use of AI scribe software for clinical note transcription with the patient, who gave verbal consent to proceed.  History of Present Illness [name] [name] is a 63 year old female with persistent thrombocytopenia who presents for evaluation and repeat blood work.  She has persistent thrombocytopenia with a current platelet count of 61,000, with previous counts of 62,000 in July last year and 67,000 in January. No significant bleeding or bruising has been reported, although she experienced a recent fall resulting in a bruise that resolved quickly. She is concerned about the safety of receiving vaccines given her low platelet count.  She experiences neuropathy, which is particularly bothersome today. She describes an incident while driving where she could not find the gas pedal due to her foot's numbness, despite wearing shoes designed for neuropathy. She is currently taking Cymbalta for this condition. She mentions a past inability to walk, requiring a wheelchair, but has since regained strength. She no longer sees a neurologist in [place] for her neuropathy and has not scheduled a new neurologist in her current area.  She is in the process of arranging for a rail to be installed for safety, following a contractor's inability to complete the task.      MEDICAL HISTORY: Past Medical History:  Diagnosis Date   Hepatic steatosis    Hepatomegaly    HTN (hypertension)    Polyneuropathy    Thiamine  deficiency neuropathy    Vitamin D deficiency     ALLERGIES:  has no known allergies.  MEDICATIONS:  Current Outpatient  Medications  Medication Sig Dispense Refill   artificial tears (LACRILUBE) OINT ophthalmic ointment Place into both eyes every 4 (four) hours as needed for dry eyes. 3.5 g 0   DULoxetine (CYMBALTA) 20 MG capsule Take 20 mg by mouth daily.     feeding supplement (ENSURE ENLIVE / ENSURE PLUS) LIQD Take 237 mLs by mouth 3 (three) times daily between meals. 237 mL 12   liver oil-zinc  oxide (DESITIN) 40 % ointment Apply topically as needed for irritation. 56.7 g 0   LORazepam  (ATIVAN ) 0.5 MG tablet Take 1 tablet (0.5 mg total) by mouth every 8 (eight) hours as needed for anxiety (nausea). 30 tablet 0   metoprolol  tartrate (LOPRESSOR ) 100 MG tablet Take 1 tablet (100 mg total) by mouth 2 (two) times daily. (Patient not taking: Reported on 09/25/2023) 60 tablet 0   Multiple Vitamin (MULTIVITAMIN WITH MINERALS) TABS tablet Take 1 tablet by mouth daily. 30 tablet 0   pantoprazole  (PROTONIX ) 40 MG tablet Take 1 tablet (40 mg total) by mouth 2 (two) times daily. 30 tablet 0   rosuvastatin (CRESTOR) 10 MG tablet Take 10 mg by mouth daily.     vitamin B-12 2000 MCG tablet Take 1 tablet (2,000 mcg total) by mouth daily. 30 tablet 0   No current facility-administered medications for this visit.    SURGICAL HISTORY: No past surgical history on file.  REVIEW OF SYSTEMS:  A comprehensive review of systems was negative except for: Neurological: positive for paresthesia   PHYSICAL EXAMINATION: General appearance: alert, cooperative, and no distress Head: Normocephalic, without obvious abnormality, atraumatic Neck: no adenopathy, no  JVD, supple, symmetrical, trachea midline, and thyroid  not enlarged, symmetric, no tenderness/mass/nodules Lymph nodes: Cervical, supraclavicular, and axillary nodes normal. Resp: clear to auscultation bilaterally Back: symmetric, no curvature. ROM normal. No CVA tenderness. Cardio: regular rate and rhythm, S1, S2 normal, no murmur, click, rub or gallop GI: soft, non-tender; bowel  sounds normal; no masses,  no organomegaly Extremities: extremities normal, atraumatic, no cyanosis or edema  ECOG PERFORMANCE STATUS: 1 - Symptomatic but completely ambulatory  Blood pressure (!) 138/93, pulse (!) 109, temperature (!) 97.2 F (36.2 C), temperature source Temporal, resp. rate 17, height 5' 2 (1.575 m), weight 142 lb 4.8 oz (64.5 kg), SpO2 98%.  LABORATORY DATA: Lab Results  Component Value Date   WBC 7.5 03/25/2024   HGB 13.8 03/25/2024   HCT 39.8 03/25/2024   MCV 95.7 03/25/2024   PLT 61 (L) 03/25/2024      Chemistry      Component Value Date/Time   NA 139 03/25/2023 1007   K 3.8 03/25/2023 1007   CL 106 03/25/2023 1007   CO2 24 03/25/2023 1007   BUN 11 03/25/2023 1007   CREATININE 0.70 03/25/2023 1007      Component Value Date/Time   CALCIUM  9.9 03/25/2023 1007   ALKPHOS 50 03/25/2023 1007   AST 29 03/25/2023 1007   ALT 27 03/25/2023 1007   BILITOT 0.5 03/25/2023 1007       RADIOGRAPHIC STUDIES: No results found.  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  She also has a history of mild liver steatosis. 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 No significant changes in hemoglobin or white blood cell count. No significant bleeding or bruising issues. Low risk of bleeding with current platelet count. - Advise she can receive the pneumonia vaccine with only minor bruising expected. - Instruct to call if there is any significant bleeding or bruising.  Neuropathy Chronic neuropathy affecting daily activities, such as driving. Managed with Cymbalta. No active neurology management. Symptoms include difficulty in controlling foot movements. - Continue Cymbalta for neuropathy management. She was advised to call immediately if she has any other concerning  symptoms in the interval.  The patient voices understanding of current disease status and treatment options and is in agreement with the current care plan.  All questions were answered. The patient knows to call the clinic with any problems, questions or concerns. We can certainly see the patient much sooner if necessary.  The total time spent in the appointment was 20 minutes.  Disclaimer: This note was dictated with voice recognition software. Similar sounding words can inadvertently be transcribed and may not be corrected upon review.

## 2024-05-10 ENCOUNTER — Other Ambulatory Visit: Payer: Self-pay | Admitting: Family Medicine

## 2024-05-10 DIAGNOSIS — K700 Alcoholic fatty liver: Secondary | ICD-10-CM

## 2024-09-25 ENCOUNTER — Inpatient Hospital Stay

## 2024-09-25 ENCOUNTER — Telehealth: Payer: Self-pay

## 2024-09-25 ENCOUNTER — Inpatient Hospital Stay: Admitting: Internal Medicine

## 2024-09-25 NOTE — Telephone Encounter (Signed)
 Voicemail left for missed appointments to be RS
